# Patient Record
Sex: Male | Born: 1960
Health system: Southern US, Community
[De-identification: ages and names within clinical notes are randomized; demographics above are authoritative.]

## PROBLEM LIST (undated history)

## (undated) DIAGNOSIS — I209 Angina pectoris, unspecified: Secondary | ICD-10-CM

## (undated) DIAGNOSIS — Z789 Other specified health status: Secondary | ICD-10-CM

## (undated) DIAGNOSIS — R011 Cardiac murmur, unspecified: Secondary | ICD-10-CM

## (undated) DIAGNOSIS — E785 Hyperlipidemia, unspecified: Secondary | ICD-10-CM

## (undated) DIAGNOSIS — T753XXA Motion sickness, initial encounter: Secondary | ICD-10-CM

## (undated) DIAGNOSIS — F419 Anxiety disorder, unspecified: Secondary | ICD-10-CM

## (undated) DIAGNOSIS — C6290 Malignant neoplasm of unspecified testis, unspecified whether descended or undescended: Secondary | ICD-10-CM

## (undated) DIAGNOSIS — N200 Calculus of kidney: Secondary | ICD-10-CM

## (undated) DIAGNOSIS — R51 Headache: Secondary | ICD-10-CM

## (undated) DIAGNOSIS — I10 Essential (primary) hypertension: Secondary | ICD-10-CM

## (undated) DIAGNOSIS — G4730 Sleep apnea, unspecified: Secondary | ICD-10-CM

## (undated) DIAGNOSIS — I2510 Atherosclerotic heart disease of native coronary artery without angina pectoris: Secondary | ICD-10-CM

## (undated) HISTORY — PX: EYE SURGERY: SHX253

## (undated) HISTORY — PX: OTHER SURGICAL HISTORY: SHX169

## (undated) HISTORY — PX: LASIK: SHX215

## (undated) HISTORY — PX: SHOULDER SURGERY: SHX246

## (undated) HISTORY — PX: HERNIA REPAIR: SHX51

## (undated) HISTORY — DX: Sleep apnea, unspecified: G47.30

## (undated) HISTORY — DX: Malignant neoplasm of unspecified testis, unspecified whether descended or undescended: C62.90

## (undated) HISTORY — DX: Anxiety disorder, unspecified: F41.9

---

## 1998-11-29 HISTORY — PX: CARDIAC CATHETERIZATION: SHX172

## 1999-10-01 ENCOUNTER — Inpatient Hospital Stay (HOSPITAL_COMMUNITY): Admission: AD | Admit: 1999-10-01 | Discharge: 1999-10-01 | Payer: Self-pay | Admitting: Cardiology

## 2001-01-13 ENCOUNTER — Encounter: Admission: RE | Admit: 2001-01-13 | Discharge: 2001-01-13 | Payer: Self-pay | Admitting: Family Medicine

## 2001-01-13 ENCOUNTER — Encounter: Payer: Self-pay | Admitting: Family Medicine

## 2003-12-25 ENCOUNTER — Ambulatory Visit (HOSPITAL_COMMUNITY): Admission: RE | Admit: 2003-12-25 | Discharge: 2003-12-25 | Payer: Self-pay | Admitting: Orthopedic Surgery

## 2005-05-04 ENCOUNTER — Encounter: Payer: Self-pay | Admitting: Emergency Medicine

## 2005-05-04 ENCOUNTER — Ambulatory Visit: Payer: Self-pay | Admitting: Cardiology

## 2005-05-04 ENCOUNTER — Observation Stay (HOSPITAL_COMMUNITY): Admission: AD | Admit: 2005-05-04 | Discharge: 2005-05-06 | Payer: Self-pay | Admitting: Cardiology

## 2005-10-22 ENCOUNTER — Encounter: Admission: RE | Admit: 2005-10-22 | Discharge: 2005-10-22 | Payer: Self-pay | Admitting: Family Medicine

## 2006-07-14 ENCOUNTER — Encounter: Admission: RE | Admit: 2006-07-14 | Discharge: 2006-07-14 | Payer: Self-pay | Admitting: Family Medicine

## 2006-12-14 ENCOUNTER — Emergency Department (HOSPITAL_COMMUNITY): Admission: EM | Admit: 2006-12-14 | Discharge: 2006-12-14 | Payer: Self-pay | Admitting: Family Medicine

## 2010-12-11 ENCOUNTER — Emergency Department (HOSPITAL_COMMUNITY)
Admission: EM | Admit: 2010-12-11 | Discharge: 2010-12-11 | Payer: Self-pay | Source: Home / Self Care | Admitting: Family Medicine

## 2011-02-10 ENCOUNTER — Inpatient Hospital Stay (INDEPENDENT_AMBULATORY_CARE_PROVIDER_SITE_OTHER)
Admission: RE | Admit: 2011-02-10 | Discharge: 2011-02-10 | Disposition: A | Discharge status: Home / Self Care | Payer: BC Managed Care – PPO | Source: Ambulatory Visit | Attending: Family Medicine | Admitting: Family Medicine

## 2011-02-10 ENCOUNTER — Ambulatory Visit (INDEPENDENT_AMBULATORY_CARE_PROVIDER_SITE_OTHER): Payer: BC Managed Care – PPO

## 2011-02-10 DIAGNOSIS — R05 Cough: Secondary | ICD-10-CM

## 2011-02-10 LAB — POCT I-STAT, CHEM 8
BUN: 10 mg/dL (ref 6–23)
Creatinine, Ser: 1 mg/dL (ref 0.4–1.5)
Hemoglobin: 14.3 g/dL (ref 13.0–17.0)
Potassium: 4 mEq/L (ref 3.5–5.1)
Sodium: 138 mEq/L (ref 135–145)

## 2011-02-10 LAB — DIFFERENTIAL
Basophils Absolute: 0 10*3/uL (ref 0.0–0.1)
Lymphocytes Relative: 30 % (ref 12–46)
Monocytes Absolute: 0.4 10*3/uL (ref 0.1–1.0)
Monocytes Relative: 6 % (ref 3–12)
Neutro Abs: 4 10*3/uL (ref 1.7–7.7)
Neutrophils Relative %: 61 % (ref 43–77)

## 2011-02-10 LAB — CBC
HCT: 40.2 % (ref 39.0–52.0)
Hemoglobin: 14.3 g/dL (ref 13.0–17.0)
MCHC: 35.6 g/dL (ref 30.0–36.0)
RBC: 4.58 MIL/uL (ref 4.22–5.81)

## 2011-04-16 NOTE — Cardiovascular Report (Signed)
Nathan Calderon, FICK NO.:  [id number]   MEDICAL RECORD NO.:  [id number]          PATIENT TYPE:  INP   LOCATION:  3706                         FACILITY:  [facility]   PHYSICIAN:  Jonelle Sidle, M.D. LHCDATE OF BIRTH:  January 25, 1961   DATE OF PROCEDURE:  DATE OF DISCHARGE:                              CARDIAC CATHETERIZATION   PRIMARY CARE PHYSICIAN:  Dr.  Nathanial Rancher in [employer].   INDICATIONS:  [name] is a 50 year old male with a history of type 2  diabetes mellitus reportedly with difficult control and a hemoglobin A1c of  10.4%, dyslipidemia, and recent progressive problems with chest pain.  He  has also noted some potential dysphasia.  Electrocardiogram shows  nonspecific changes with a Q-wave and inverted T-wave in lead III.  He has  ruled out for myocardial infarction, although has had some intermittent  chest pain during his hospital observation.  He is referred now for  definitive coronary angiography to reassess the coronary anatomy.   PROCEDURES PERFORMED:  1.  Left heart catheterization.  2.  Selective coronary angiography.  3.  Left ventriculography.   ACCESS AND EQUIPMENT:  The area about the right femoral artery was  anesthetized with 1% lidocaine and a 6-French sheath was placed in the right  femoral artery via the modified Seldinger technique.  Standard preformed 6-  [place] and JR4 catheters were used for selective coronary angiography  and an angled pigtail catheter was used for left heart catheterization and  left ventriculography.  All exchanges were made over a wire and the patient  tolerated the procedure well without immediate complication.   HEMODYNAMIC RESULTS:  Left ventricle 133/17 mmHg, post angiography.  Aorta  132/72 mmHg.   ANGIOGRAPHIC FINDINGS:  1.  The left main coronary artery is free of significant flow-limiting      coronary atherosclerosis.  2.  The left anterior descending is a medium- to large-caliber vessel that  bifurcates at the apex.  There is a large proximal bifurcating diagonal      branch as well as smaller subbranch proximal to this.  Minimal luminal      irregularities are noted.  3.  The circumflex coronary artery is a medium- to large-caliber vessel with      3 obtuse marginal branches.  There is approximately 20% stenosis in the      most proximal obtuse marginal branch and other minor luminal      irregularities.  No flow-limiting stenoses are noted.  4.  The right coronary artery is a large vessel with a large proximal RV      marginal branch.  This vessel and dominant.  Only minor luminal      irregularities are noted.   LEFT VENTRICULOGRAPHY:  Left ventriculography was performed in the RAO  projection revealing an ejection fraction of 55% to 60% with no significant  mitral regurgitation.   DIAGNOSES:  1.  Minor coronary atherosclerosis as described with no flow-limiting      lesions.  2.  Left ventricular ejection fraction of 55% to 60% with left ventricle end-  diastolic pressure of 17 mmHg, post angiography, and no significant      mitral regurgitation.   RECOMMENDATIONS:  I reviewed the results with the patient and with Dr.  Nathanial Rancher by phone.  Plan at this point will be to begin a proton pump  inhibitor to treat for potential gastrointestinal etiology such as  gastroesophageal reflux disease and/or esophagitis.  The patient will  otherwise continue diabetes management and lipid control (recent LDL  cholesterol was 77) and also begin taking a coated aspirin daily.  He will  plan to follow up Dr. Nathanial Rancher this month.        SGM/MEDQ  D:  05/06/2005  T:  05/06/2005  Job:  [id number]   cc:   Dr. Nathanial Rancher, [place], [place]

## 2011-04-16 NOTE — Discharge Summary (Signed)
NAMEASSAD, HARBESON                 ACCOUNT NO.:  [id number]   MEDICAL RECORD NO.:  [id number]          PATIENT TYPE:  INP   LOCATION:  3706                         FACILITY:  [facility]   PHYSICIAN:  Jonelle Sidle, M.D. LHCDATE OF BIRTH:  1961/08/16   DATE OF ADMISSION:  05/04/2005  DATE OF DISCHARGE:  05/06/2005                                 DISCHARGE SUMMARY   PROCEDURES:  1.  Cardiac catheterization.  2.  Coronary arteriogram.  3.  Left ventriculogram.   DISCHARGE DIAGNOSES:  1.  Chest pain, status post cardiac catheterization this admission with 20%      lesion in the OM, as the only lesion, and an EF of 55-65% with no wall      motion abnormalities.  2.  Insulin dependent diabetes, hemoglobin A1c 9.7 this admission.  3.  Hyperlipidemia, Lipitor 10 mg daily with a total cholesterol 136,      triglycerides 129, HDL 33, LDL 77, this admission.  4.  Family history of coronary artery disease in his father who died at age      80 of an myocardial infarction; and his mother died who died at age 50      of diabetes and possibly also with heart disease.  5.  Status post hernia repair, shoulder surgery, LASIK eye surgery, and      cardiac catheterization.  6.  Dysphagia, occasionally feeling like he has trouble swallowing.   HOSPITAL COURSE:  Mr. Keidel is a 50 year old male with a history of chest pain  and cardiac catheterization in 2000. He had chest pain that was intermittent  and started 5 days prior to admission. He stated there was no change with  activity and was getting multiple episodes every day. He complained of  general malaise and fatigue and stated that these symptoms have been worse  than usual. He stated that CBGs have been between 150 and 450 recently. He  came to the emergency room and was admitted for further evaluation and  treatment.   His cardiac enzymes were negative for MI. He was placed on IV nitroglycerin  and remained pain free but had a headache and so  this was discontinued. He  had no recurrence of pain. It was felt that cardiac catheterization was  indicated to further evaluate his anatomy; and this was performed on  05/06/2005.   The cardiac catheterization showed a 20% lesion in the OM. There was no  other disease and he had no MR, no wall motion abnormalities. His EF was 55-  60%. Dr. Diona Browner evaluated Mr. Thier and felt that he needed a proton pump  inhibitor for potential GI etiology. He is to continue the aspirin, Plavix  and Lipitor as well as he is encouraged to follow a diabetic diet and work  with Dr. Lemar Livings on diabetes management.   Postcath Mr. Vangorder was without chest pain or shortness of breath. His groin  was without ecchymosis or hematoma. He was considered stable for discharge;  and has outpatient followup arranged with Dr. Lemar Livings and is to follow up  with Dr. Diona Browner  on a p.r.n. basis.   DISCHARGE INSTRUCTIONS:  1.  His activity level is to include no driving for 2 days and no strenuous      activity for 5 days.  2.  He is to stick to a low-fat diabetic diet.  3.  He is to call our office for problems with the cath site.  4.  He has a followup appoint with Dr. Lemar Livings on July 10 at 3:45 and he is      to followup with Dr. Diona Browner as needed.   DISCHARGE MEDICATIONS:  1.  Coated aspirin 81 mg daily.  2.  Lantus 60 units every evening.  3.  Lipitor 10 mg daily.  4.  Nexium 40 mg daily.      Hermenia Bers   RB/MEDQ  D:  05/06/2005  T:  05/06/2005  Job:  [id number]   cc:   Dr. Candelaria Stagers, M.D. [facility]

## 2011-04-16 NOTE — H&P (Signed)
NAMEMARSEAN, ELKHATIB NO.:  [id number]   MEDICAL RECORD NO.:  [id number]          PATIENT TYPE:  INP   LOCATION:  0103                         FACILITY:  [facility]   PHYSICIAN:  Jonelle Sidle, M.D. LHCDATE OF BIRTH:  [birth year]   DATE OF ADMISSION:  05/04/2005  DATE OF DISCHARGE:                                HISTORY & PHYSICAL   PRIMARY CARE PHYSICIAN:  Dr. Nathanial Rancher in [place].   REASON FOR ADMISSION:  Chest discomfort.   HISTORY OF PRESENT ILLNESS:  Mr. Carriger is a pleasant 50 year old male with a  history of type 2 diabetes mellitus, reportedly with difficult control  recently, and an hemoglobin A1C of 10.4% and dyslipidemia, who presented to  [facility] emergency department complaining of rapid onset of  chest pain.  He describes an approximately one-week history of intermittent,  dull chest pain that is mild and at times in different parts of his chest,  sometimes radiating up to his neck.  He notes no specific trigger, although  he does state that he has had a longer-standing problem with dysphagia and  seems to think this may be related.  He is not certain about symptoms  proceeding a previous heart catheterization, which was performed back in  November, 2000 by Dr. Eden Emms.  This study showed essentially normal coronary  arteries with an ejection fraction of 81% and at that point, no other  specific cardiac testing was undertaken.  Mr. Eskew denies having any recent  exertional chest pain.  His initial point-of-care troponin I levels are  normal, and his electrocardiogram is nonspecific, showing sinus rhythm at 76  beats per minute.  There is a Q wave with inverted T wave in lead III but no  diagnostic Q's in II or AVS.  He is painfree at this particular point.  He  denies having any interval cardiac testing or cardiology followup since his  catheterization in 2000.   ALLERGIES:  No known drug allergies.   CURRENT MEDICATIONS:  1.  Lantus 60  units daily.  2.  Lipitor 10 mg p.o. daily.   PAST MEDICAL HISTORY:  1.  Essentially normal coronary arteries with an ejection fraction of 81%,      documented at catheterization in November, 2000 by Dr. Eden Emms.  2.  Type 2 diabetes mellitus, reportedly with difficult control recently and      an hemoglobin A1C of 10.4%.  3.  Dyslipidemia.  Reportedly well controlled on statin therapy.  4.  Status post hernia repair, left shoulder surgery, LASIX surgery on both      eyes.   SOCIAL HISTORY:  Patient is married and lives in [place].  He works as a  Teaching laboratory technician, which he states is not particularly strenuous.  He  has no significant tobacco or alcohol use history.   FAMILY HISTORY:  Significant for premature cardiovascular disease.  The  patient states that his mother died at age 68 with type 2 diabetes mellitus  and an enlarged heart.  He also stated that his father died at age 50 with  a myocardial  infarction.   REVIEW OF SYSTEMS:  As described in the present illness.  He has had no  recent weight change, although he reports a 50 pound weight loss  approximately seven years ago.  He has intermittent headaches but no  dizziness or syncope.  He has had some slight shortness of breath activity  but without significant change recently.  He does report problems with  prior cartilage damage after using a post-hole digger.  He does have  some intermittent apparent chest wall pain due to this.  He has also had  trouble with dysphagia recently.  He has had some reflux symptoms as well.  No lower extremity edema or claudication symptoms.   PHYSICAL EXAMINATION:  VITAL SIGNS:  Temperature 97.7 degrees, heart rate 79  and regular, respirations 20, blood pressure 129/77.  Oxygen saturation is  99% on room air.  GENERAL:  This is a well-developed male lying in bed in no acute distress.  HEENT:  Conjunctivae and lids are normal.  Pharynx is clear.  NECK:  Supple without elevated jugular  venous pressure or carotid bruits.  No thyromegaly is noted.  LUNGS:  Clear to auscultation with bilateral breathing at rest.  CARDIOVASCULAR:  Regular rate and rhythm without loud murmur or S3 gallop.  There is no pericardial rub.  There is no chest wall tenderness to  palpation.  ABDOMEN:  Soft and nontender with no hepatomegaly.  No bruits are noted.  There is no guarding.  EXTREMITIES:  No pitting edema with 2+ pulses.  SKIN:  No ulcer changes are noted.  MUSCULOSKELETAL:  No kyphosis noted.  NEUROLOGIC:  Patient is alert and oriented x 3.  Affect is normal.   Initial laboratory data shows normal point-of-care troponin I levels.  WBC  6.2, hemoglobin 14.3, hematocrit 39.5, platelets 214.  The remaining labs  are pending, as is chest x-ray.   IMPRESSION:  1.  Chest pain syndrome, as outlined, in a 50 year old male with somewhat      atypical features, although in the setting of type 2 diabetes mellitus,      a family history of premature cardiovascular disease.  He underwent      cardiac catheterization approximately six years ago, showing reassuring      coronary anatomy and normal ejection fraction at that time but has had      no interval testing.  The symptoms have been of recent onset, although      he has also been plagued by problems with dysphagia and apparent chest      wall pain due to prior cartilage damage.  His initial point-of-care      cardiac markers are negative, and his electrocardiogram is nonspecific      with a single Q wave and T wave inversion in lead III.  He is painfree      at present.  2.  Type 2 diabetes mellitus.  3.  Hyperdyslipidemia:  On statin therapy.  4.  Family history of premature cardiovascular disease.   PLAN:  1.  Patient will be admitted to telemetry at [facility]  We will      continue to cycle cardiac markers on heparin and aspirin in addition to     the patient's home medications and sliding-scale insulin.  2.  He will be  kept n.p.o. after midnight.  Will make firm plans regarding      additional cardiac testing in the morning when all laboratory data has  returned and based on further clinical reassessment.  Further plans to      follow.     SGM/MEDQ  D:  05/04/2005  T:  05/04/2005  Job:  [id number]   cc:   Dr. Susie Cassette, [place]

## 2012-03-16 ENCOUNTER — Encounter (INDEPENDENT_AMBULATORY_CARE_PROVIDER_SITE_OTHER): Payer: BC Managed Care – PPO | Admitting: Ophthalmology

## 2012-03-16 DIAGNOSIS — H3581 Retinal edema: Secondary | ICD-10-CM

## 2012-03-16 DIAGNOSIS — H2510 Age-related nuclear cataract, unspecified eye: Secondary | ICD-10-CM

## 2012-03-16 DIAGNOSIS — E11359 Type 2 diabetes mellitus with proliferative diabetic retinopathy without macular edema: Secondary | ICD-10-CM

## 2012-03-16 DIAGNOSIS — H43819 Vitreous degeneration, unspecified eye: Secondary | ICD-10-CM

## 2012-03-16 DIAGNOSIS — E1139 Type 2 diabetes mellitus with other diabetic ophthalmic complication: Secondary | ICD-10-CM

## 2012-03-16 DIAGNOSIS — H4310 Vitreous hemorrhage, unspecified eye: Secondary | ICD-10-CM

## 2012-03-21 ENCOUNTER — Encounter (HOSPITAL_COMMUNITY): Payer: Self-pay | Admitting: Pharmacy Technician

## 2012-03-21 ENCOUNTER — Other Ambulatory Visit (INDEPENDENT_AMBULATORY_CARE_PROVIDER_SITE_OTHER): Payer: BC Managed Care – PPO | Admitting: Ophthalmology

## 2012-03-21 DIAGNOSIS — E11359 Type 2 diabetes mellitus with proliferative diabetic retinopathy without macular edema: Secondary | ICD-10-CM

## 2012-03-22 ENCOUNTER — Other Ambulatory Visit (INDEPENDENT_AMBULATORY_CARE_PROVIDER_SITE_OTHER): Payer: BC Managed Care – PPO | Admitting: Ophthalmology

## 2012-03-22 DIAGNOSIS — H3581 Retinal edema: Secondary | ICD-10-CM

## 2012-04-03 ENCOUNTER — Encounter (HOSPITAL_COMMUNITY): Payer: Self-pay

## 2012-04-03 NOTE — Progress Notes (Signed)
Contacted Dr. Ashley Royalty office, spoke with Darel Hong requested preop orders for surgery on 04/04/12.

## 2012-04-04 ENCOUNTER — Ambulatory Visit (HOSPITAL_COMMUNITY): Payer: BC Managed Care – PPO | Admitting: Anesthesiology

## 2012-04-04 ENCOUNTER — Encounter (HOSPITAL_COMMUNITY): Payer: Self-pay | Admitting: Certified Registered Nurse Anesthetist

## 2012-04-04 ENCOUNTER — Ambulatory Visit (HOSPITAL_COMMUNITY)
Admission: RE | Admit: 2012-04-04 | Discharge: 2012-04-04 | Disposition: A | Discharge status: Home / Self Care | Payer: BC Managed Care – PPO | Source: Ambulatory Visit | Attending: Ophthalmology | Admitting: Ophthalmology

## 2012-04-04 ENCOUNTER — Encounter (HOSPITAL_COMMUNITY): Payer: Self-pay | Admitting: Anesthesiology

## 2012-04-04 ENCOUNTER — Encounter (HOSPITAL_COMMUNITY)
Admission: RE | Disposition: A | Discharge status: Home / Self Care | Payer: Self-pay | Source: Ambulatory Visit | Attending: Ophthalmology

## 2012-04-04 ENCOUNTER — Ambulatory Visit (HOSPITAL_COMMUNITY): Payer: BC Managed Care – PPO

## 2012-04-04 ENCOUNTER — Encounter (HOSPITAL_COMMUNITY): Payer: Self-pay | Admitting: *Deleted

## 2012-04-04 ENCOUNTER — Ambulatory Visit (HOSPITAL_COMMUNITY): Admission: RE | Admit: 2012-04-04 | Payer: BC Managed Care – PPO | Source: Ambulatory Visit | Admitting: Ophthalmology

## 2012-04-04 DIAGNOSIS — H4310 Vitreous hemorrhage, unspecified eye: Secondary | ICD-10-CM

## 2012-04-04 DIAGNOSIS — E1139 Type 2 diabetes mellitus with other diabetic ophthalmic complication: Secondary | ICD-10-CM

## 2012-04-04 DIAGNOSIS — I10 Essential (primary) hypertension: Secondary | ICD-10-CM | POA: Insufficient documentation

## 2012-04-04 DIAGNOSIS — E1165 Type 2 diabetes mellitus with hyperglycemia: Secondary | ICD-10-CM

## 2012-04-04 DIAGNOSIS — E785 Hyperlipidemia, unspecified: Secondary | ICD-10-CM | POA: Insufficient documentation

## 2012-04-04 DIAGNOSIS — E113599 Type 2 diabetes mellitus with proliferative diabetic retinopathy without macular edema, unspecified eye: Secondary | ICD-10-CM

## 2012-04-04 DIAGNOSIS — H3340 Traction detachment of retina, unspecified eye: Secondary | ICD-10-CM

## 2012-04-04 DIAGNOSIS — E11359 Type 2 diabetes mellitus with proliferative diabetic retinopathy without macular edema: Secondary | ICD-10-CM

## 2012-04-04 DIAGNOSIS — R51 Headache: Secondary | ICD-10-CM | POA: Insufficient documentation

## 2012-04-04 HISTORY — PX: PARS PLANA VITRECTOMY: SHX2166

## 2012-04-04 HISTORY — DX: Hyperlipidemia, unspecified: E78.5

## 2012-04-04 HISTORY — DX: Angina pectoris, unspecified: I20.9

## 2012-04-04 HISTORY — DX: Calculus of kidney: N20.0

## 2012-04-04 HISTORY — DX: Essential (primary) hypertension: I10

## 2012-04-04 HISTORY — DX: Headache: R51

## 2012-04-04 LAB — CBC
HCT: 39.5 % (ref 39.0–52.0)
Hemoglobin: 14.5 g/dL (ref 13.0–17.0)
RBC: 4.63 MIL/uL (ref 4.22–5.81)
WBC: 7.3 10*3/uL (ref 4.0–10.5)

## 2012-04-04 LAB — BASIC METABOLIC PANEL
BUN: 13 mg/dL (ref 6–23)
CO2: 29 mEq/L (ref 19–32)
Chloride: 104 mEq/L (ref 96–112)
Glucose, Bld: 234 mg/dL — ABNORMAL HIGH (ref 70–99)
Potassium: 4.6 mEq/L (ref 3.5–5.1)

## 2012-04-04 LAB — SURGICAL PCR SCREEN: Staphylococcus aureus: NEGATIVE

## 2012-04-04 LAB — GLUCOSE, CAPILLARY: Glucose-Capillary: 215 mg/dL — ABNORMAL HIGH (ref 70–99)

## 2012-04-04 SURGERY — PARS PLANA VITRECTOMY WITH 25 GAUGE
Anesthesia: General | Site: Eye | Laterality: Left | Wound class: Clean

## 2012-04-04 MED ORDER — CEFAZOLIN SODIUM-DEXTROSE 2-3 GM-% IV SOLR
2.0000 g | INTRAVENOUS | Status: AC
  Administered 2012-04-04: 2 g via INTRAVENOUS

## 2012-04-04 MED ORDER — LACTATED RINGERS IV SOLN: INTRAVENOUS | Status: DC

## 2012-04-04 MED ORDER — EPINEPHRINE HCL 1 MG/ML IJ SOLN
INTRAOCULAR | Status: DC | PRN
  Administered 2012-04-04: 13:00:00

## 2012-04-04 MED ORDER — ONDANSETRON HCL 4 MG/2ML IJ SOLN
INTRAMUSCULAR | Status: DC | PRN
  Administered 2012-04-04: 4 mg via INTRAVENOUS

## 2012-04-04 MED ORDER — LACTATED RINGERS IV SOLN: INTRAVENOUS | Status: DC | PRN

## 2012-04-04 MED ORDER — GATIFLOXACIN 0.5 % OP SOLN
1.0000 [drp] | OPHTHALMIC | Status: AC | PRN
  Administered 2012-04-04 (×3): 1 [drp] via OPHTHALMIC
  Filled 2012-04-04: qty 2.5

## 2012-04-04 MED ORDER — ATROPINE SULFATE 1 % OP SOLN
OPHTHALMIC | Status: DC | PRN
  Administered 2012-04-04: 2 [drp] via OPHTHALMIC

## 2012-04-04 MED ORDER — PROPOFOL 10 MG/ML IV EMUL
INTRAVENOUS | Status: DC | PRN
  Administered 2012-04-04: 200 mg via INTRAVENOUS

## 2012-04-04 MED ORDER — CEFAZOLIN SODIUM 1-5 GM-% IV SOLN
1.0000 g | INTRAVENOUS | Status: DC
  Filled 2012-04-04: qty 50

## 2012-04-04 MED ORDER — CYCLOPENTOLATE HCL 1 % OP SOLN
1.0000 [drp] | OPHTHALMIC | Status: AC | PRN
  Administered 2012-04-04 (×3): 1 [drp] via OPHTHALMIC
  Filled 2012-04-04: qty 2

## 2012-04-04 MED ORDER — BACITRACIN-POLYMYXIN B 500-10000 UNIT/GM OP OINT
TOPICAL_OINTMENT | OPHTHALMIC | Status: DC | PRN
  Administered 2012-04-04: 1 via OPHTHALMIC

## 2012-04-04 MED ORDER — ONDANSETRON HCL 4 MG/2ML IJ SOLN: 4.0000 mg | Freq: Four times a day (QID) | INTRAMUSCULAR | Status: DC | PRN

## 2012-04-04 MED ORDER — GATIFLOXACIN 0.5 % OP SOLN
1.0000 [drp] | Freq: Four times a day (QID) | OPHTHALMIC | Status: DC
  Filled 2012-04-04: qty 2.5

## 2012-04-04 MED ORDER — PREDNISOLONE ACETATE 1 % OP SUSP
1.0000 [drp] | Freq: Four times a day (QID) | OPHTHALMIC | Status: DC
  Filled 2012-04-04: qty 1

## 2012-04-04 MED ORDER — GLYCOPYRROLATE 0.2 MG/ML IJ SOLN
INTRAMUSCULAR | Status: DC | PRN
  Administered 2012-04-04: 0.6 mg via INTRAVENOUS

## 2012-04-04 MED ORDER — CEFAZOLIN SODIUM-DEXTROSE 2-3 GM-% IV SOLR
INTRAVENOUS | Status: AC
  Filled 2012-04-04: qty 50

## 2012-04-04 MED ORDER — OXYCODONE-ACETAMINOPHEN 5-325 MG PO TABS
1.0000 | ORAL_TABLET | ORAL | Status: DC | PRN
  Administered 2012-04-04: 2 via ORAL

## 2012-04-04 MED ORDER — PROVISC 10 MG/ML IO SOLN
INTRAOCULAR | Status: DC | PRN
  Administered 2012-04-04: .85 mL via INTRAOCULAR

## 2012-04-04 MED ORDER — SODIUM CHLORIDE 0.9 % IV SOLN
INTRAVENOUS | Status: DC | PRN
  Administered 2012-04-04: 13:00:00 via INTRAVENOUS

## 2012-04-04 MED ORDER — MUPIROCIN 2 % EX OINT
TOPICAL_OINTMENT | CUTANEOUS | Status: AC
  Administered 2012-04-04: 1 via NASAL
  Filled 2012-04-04: qty 22

## 2012-04-04 MED ORDER — ACETAZOLAMIDE SODIUM 500 MG IJ SOLR
500.0000 mg | Freq: Once | INTRAMUSCULAR | Status: DC
  Filled 2012-04-04: qty 500

## 2012-04-04 MED ORDER — SODIUM CHLORIDE 0.9 % IJ SOLN
INTRAMUSCULAR | Status: DC | PRN
  Administered 2012-04-04: 13:00:00

## 2012-04-04 MED ORDER — LATANOPROST 0.005 % OP SOLN
1.0000 [drp] | Freq: Every day | OPHTHALMIC | Status: DC
  Filled 2012-04-04: qty 2.5

## 2012-04-04 MED ORDER — BACITRACIN-POLYMYXIN B 500-10000 UNIT/GM OP OINT
1.0000 "application " | TOPICAL_OINTMENT | Freq: Four times a day (QID) | OPHTHALMIC | Status: DC
  Filled 2012-04-04: qty 3.5

## 2012-04-04 MED ORDER — PHENYLEPHRINE HCL 2.5 % OP SOLN
1.0000 [drp] | OPHTHALMIC | Status: AC | PRN
  Administered 2012-04-04 (×3): 1 [drp] via OPHTHALMIC
  Filled 2012-04-04: qty 3

## 2012-04-04 MED ORDER — FENTANYL CITRATE 0.05 MG/ML IJ SOLN
INTRAMUSCULAR | Status: DC | PRN
  Administered 2012-04-04: 25 ug via INTRAVENOUS
  Administered 2012-04-04: 50 ug via INTRAVENOUS

## 2012-04-04 MED ORDER — TROPICAMIDE 1 % OP SOLN
1.0000 [drp] | OPHTHALMIC | Status: AC | PRN
  Administered 2012-04-04 (×3): 1 [drp] via OPHTHALMIC
  Filled 2012-04-04: qty 3

## 2012-04-04 MED ORDER — BUPIVACAINE HCL 0.75 % IJ SOLN
INTRAMUSCULAR | Status: DC | PRN
  Administered 2012-04-04: 10 mL

## 2012-04-04 MED ORDER — MIDAZOLAM HCL 5 MG/5ML IJ SOLN
INTRAMUSCULAR | Status: DC | PRN
  Administered 2012-04-04: 1 mg via INTRAVENOUS

## 2012-04-04 MED ORDER — 0.9 % SODIUM CHLORIDE (POUR BTL) OPTIME
TOPICAL | Status: DC | PRN
  Administered 2012-04-04: 1000 mL

## 2012-04-04 MED ORDER — MUPIROCIN 2 % EX OINT
TOPICAL_OINTMENT | Freq: Once | CUTANEOUS | Status: AC
  Administered 2012-04-04: 1 via NASAL
  Filled 2012-04-04: qty 22

## 2012-04-04 MED ORDER — NEOSTIGMINE METHYLSULFATE 1 MG/ML IJ SOLN
INTRAMUSCULAR | Status: DC | PRN
  Administered 2012-04-04: 4 mg via INTRAVENOUS

## 2012-04-04 MED ORDER — BSS PLUS IO SOLN
INTRAOCULAR | Status: DC | PRN
  Administered 2012-04-04: 1

## 2012-04-04 MED ORDER — FENTANYL CITRATE 0.05 MG/ML IJ SOLN: 25.0000 ug | INTRAMUSCULAR | Status: DC | PRN

## 2012-04-04 MED ORDER — DEXAMETHASONE SODIUM PHOSPHATE 10 MG/ML IJ SOLN
INTRAMUSCULAR | Status: DC | PRN
  Administered 2012-04-04: 10 mg

## 2012-04-04 MED ORDER — BRIMONIDINE TARTRATE 0.2 % OP SOLN
1.0000 [drp] | Freq: Two times a day (BID) | OPHTHALMIC | Status: DC
  Filled 2012-04-04: qty 5

## 2012-04-04 MED ORDER — OXYCODONE-ACETAMINOPHEN 10-325 MG PO TABS: 1.0000 | ORAL_TABLET | ORAL | Status: AC | PRN

## 2012-04-04 MED ORDER — ROCURONIUM BROMIDE 100 MG/10ML IV SOLN
INTRAVENOUS | Status: DC | PRN
  Administered 2012-04-04: 50 mg via INTRAVENOUS

## 2012-04-04 MED ORDER — SODIUM CHLORIDE 0.9 % IV SOLN
INTRAVENOUS | Status: DC
  Administered 2012-04-04: 12:00:00 via INTRAVENOUS

## 2012-04-04 SURGICAL SUPPLY — 71 items
APL SRG 3 HI ABS STRL LF PLS (MISCELLANEOUS)
APPLICATOR DR MATTHEWS STRL (MISCELLANEOUS) IMPLANT
BALL CTTN LRG ABS STRL LF (GAUZE/BANDAGES/DRESSINGS) ×3
BLADE EYE CATARACT 19 1.4 BEAV (BLADE) IMPLANT
BLADE MVR KNIFE 19G (BLADE) IMPLANT
BLADE MVR KNIFE 20G (BLADE) IMPLANT
CANNULA DUAL BORE 23G (CANNULA) IMPLANT
CANNULA FLEX TIP 25G (CANNULA) IMPLANT
CLOTH BEACON ORANGE TIMEOUT ST (SAFETY) ×2 IMPLANT
CORDS BIPOLAR (ELECTRODE) ×1 IMPLANT
COTTONBALL LRG STERILE PKG (GAUZE/BANDAGES/DRESSINGS) ×6 IMPLANT
DRAPE INCISE 51X51 W/FILM STRL (DRAPES) ×2 IMPLANT
DRAPE OPHTHALMIC 77X100 STRL (CUSTOM PROCEDURE TRAY) ×2 IMPLANT
FILTER BLUE MILLIPORE (MISCELLANEOUS) IMPLANT
FILTER STRAW FLUID ASPIR (MISCELLANEOUS) IMPLANT
FORCEPS ECKARDT ILM 25G SERR (OPHTHALMIC RELATED) IMPLANT
GLOVE SS BIOGEL STRL SZ 6.5 (GLOVE) ×1 IMPLANT
GLOVE SS BIOGEL STRL SZ 7 (GLOVE) ×1 IMPLANT
GLOVE SUPERSENSE BIOGEL SZ 6.5 (GLOVE) ×1
GLOVE SUPERSENSE BIOGEL SZ 7 (GLOVE) ×1
GLOVE SURG 8.5 LATEX PF (GLOVE) ×2 IMPLANT
GOWN STRL NON-REIN LRG LVL3 (GOWN DISPOSABLE) ×6 IMPLANT
ILLUMINATOR CHOW PICK 25GA (MISCELLANEOUS) ×2 IMPLANT
KIT BASIN OR (CUSTOM PROCEDURE TRAY) ×2 IMPLANT
KIT ROOM TURNOVER OR (KITS) ×2 IMPLANT
KNIFE CRESCENT 2.5 55 ANG (BLADE) IMPLANT
LENS BIOM SUPER VIEW SET DISP (OPHTHALMIC RELATED) ×1 IMPLANT
MARKER SKIN DUAL TIP RULER LAB (MISCELLANEOUS) IMPLANT
MASK EYE SHIELD (GAUZE/BANDAGES/DRESSINGS) ×1 IMPLANT
MICROPICK 25G (MISCELLANEOUS)
NDL 18GX1X1/2 (RX/OR ONLY) (NEEDLE) ×1 IMPLANT
NDL 25GX 5/8IN NON SAFETY (NEEDLE) ×1 IMPLANT
NDL FILTER BLUNT 18X1 1/2 (NEEDLE) ×1 IMPLANT
NDL HYPO 30X.5 LL (NEEDLE) ×1 IMPLANT
NEEDLE 18GX1X1/2 (RX/OR ONLY) (NEEDLE) ×2 IMPLANT
NEEDLE 25GX 5/8IN NON SAFETY (NEEDLE) ×2 IMPLANT
NEEDLE 27GAX1X1/2 (NEEDLE) IMPLANT
NEEDLE FILTER BLUNT 18X 1/2SAF (NEEDLE) ×1
NEEDLE FILTER BLUNT 18X1 1/2 (NEEDLE) ×1 IMPLANT
NEEDLE HYPO 30X.5 LL (NEEDLE) ×2 IMPLANT
NS IRRIG 1000ML POUR BTL (IV SOLUTION) ×2 IMPLANT
PACK VITRECTOMY CUSTOM (CUSTOM PROCEDURE TRAY) ×2 IMPLANT
PAD ARMBOARD 7.5X6 YLW CONV (MISCELLANEOUS) ×4 IMPLANT
PAD EYE OVAL STERILE LF (GAUZE/BANDAGES/DRESSINGS) ×1 IMPLANT
PAK VITRECTOMY PIK 25 GA (OPHTHALMIC RELATED) ×2 IMPLANT
PENCIL BIPOLAR 25GA STR DISP (OPHTHALMIC RELATED) ×1 IMPLANT
PICK MICROPICK 25G (MISCELLANEOUS) IMPLANT
PROBE DIRECTIONAL LASER (MISCELLANEOUS) IMPLANT
REPL STRA BRUSH NDL (NEEDLE) IMPLANT
REPL STRA BRUSH NEEDLE (NEEDLE) IMPLANT
RESERVOIR BACK FLUSH (MISCELLANEOUS) IMPLANT
ROLLS DENTAL (MISCELLANEOUS) ×4 IMPLANT
SCRAPER DIAMOND DUST MEMBRANE (MISCELLANEOUS) IMPLANT
SPONGE SURGIFOAM ABS GEL 12-7 (HEMOSTASIS) ×2 IMPLANT
STOPCOCK 4 WAY LG BORE MALE ST (IV SETS) IMPLANT
SUT CHROMIC 7 0 TG140 8 (SUTURE) IMPLANT
SUT ETHILON 10 0 CS140 6 (SUTURE) IMPLANT
SUT ETHILON 9 0 TG140 8 (SUTURE) IMPLANT
SUT POLY NON ABSORB 10-0 8 STR (SUTURE) IMPLANT
SUT SILK 4 0 RB 1 (SUTURE) IMPLANT
SYR 20CC LL (SYRINGE) ×2 IMPLANT
SYR 5ML LL (SYRINGE) IMPLANT
SYR BULB 3OZ (MISCELLANEOUS) ×2 IMPLANT
SYR TB 1ML LUER SLIP (SYRINGE) ×2 IMPLANT
SYRINGE 10CC LL (SYRINGE) IMPLANT
TAPE SURG TRANSPORE 1 IN (GAUZE/BANDAGES/DRESSINGS) IMPLANT
TAPE SURGICAL TRANSPORE 1 IN (GAUZE/BANDAGES/DRESSINGS) ×1
TOWEL OR 17X24 6PK STRL BLUE (TOWEL DISPOSABLE) ×6 IMPLANT
TROCAR CANNULA 25GA (CANNULA) IMPLANT
WATER STERILE IRR 1000ML POUR (IV SOLUTION) ×2 IMPLANT
WIPE INSTRUMENT VISIWIPE 73X73 (MISCELLANEOUS) ×2 IMPLANT

## 2012-04-04 NOTE — H&P (Signed)
I examined the patient today and there is no change in the medical status 

## 2012-04-04 NOTE — Procedures (Signed)
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  Assistant:  Rosalie Doctor SA    Anesthesia: General  Specimen: none  Estimated blood loss:  1cc  Complications: none  Patient sent to PACU in good condition  Composed by Sherrie George MD  Dictation number: [phone]

## 2012-04-04 NOTE — Transfer of Care (Signed)
Immediate Anesthesia Transfer of Care Note  Patient: Nathan Calderon  Procedure(s) Performed: Procedure(s) (LRB): PARS PLANA VITRECTOMY WITH 25 GAUGE (Left) MEMBRANE PEEL (Left)  Patient Location: PACU  Anesthesia Type: General  Level of Consciousness: awake, alert , oriented and patient cooperative  Airway & Oxygen Therapy: Patient Spontanous Breathing and Patient connected to nasal cannula oxygen  Post-op Assessment: Report given to PACU RN, Post -op Vital signs reviewed and stable and Patient moving all extremities X 4  Post vital signs: Reviewed and stable  Complications: No apparent anesthesia complications

## 2012-04-04 NOTE — H&P (Signed)
Nathan Calderon is an 51 y.o. male.   Chief Complaint: Loss of vision left eye HPI: Longstanding diabetic with vitreous hemorrhage and traction retinal detachment left eye  Past Medical History  Diagnosis Date  . Angina     2006, had cardiac cath at the time  . Diabetes mellitus   . Kidney stone     hx of  . Headache   . Hypertension   . Hyperlipidemia     Past Surgical History  Procedure Date  . Eye surgery     x 2  . Lasik   . Hernia repair     with undecended testicle  . Shoulder surgery     left shoulder    History reviewed. No pertinent family history. Social History:  reports that he has never smoked. He does not have any smokeless tobacco history on file. He reports that he drinks alcohol. He reports that he does not use illicit drugs.  Allergies: No Known Allergies  Medications Prior to Admission  Medication Sig Dispense Refill  . aspirin EC 81 MG tablet Take 81 mg by mouth daily.      Marland Kitchen atorvastatin (LIPITOR) 20 MG tablet Take 20 mg by mouth daily.      Marland Kitchen glyBURIDE-metformin (GLUCOVANCE) 5-500 MG per tablet Take 2 tablets by mouth 2 (two) times daily with a meal.      . insulin glargine (LANTUS SOLOSTAR) 100 UNIT/ML injection Inject 30 Units into the skin at bedtime.      Marland Kitchen lisinopril (PRINIVIL,ZESTRIL) 10 MG tablet Take 10 mg by mouth at bedtime.        Review of systems otherwise negative  Height 6\' 1"  (1.854 m), weight 235 lb (106.595 kg).  Physical exam: Mental status: oriented x3. Eyes: See eye exam associated with this date of surgery in media tab.  Scanned in by scanning center Ears, Nose, Throat: within normal limits Neck: Within Normal limits General: within normal limits Chest: Within normal limits Breast: deferred Heart: Within normal limits Abdomen: Within normal limits GU: deferred Extremities: within normal limits Skin: within normal limits  Assessment/Plan Longstanding diabetic with vitreous hemorrhage and traction retinal detachment  left eye  Plan: To [facility] for Pars plana vitrectomy left eye with laser and gas injection  Maika Mcelveen D 04/04/2012, 9:03 AM 2

## 2012-04-04 NOTE — Progress Notes (Signed)
Dr. Ladene Artist notified of EKG. No orders received.

## 2012-04-04 NOTE — Anesthesia Postprocedure Evaluation (Signed)
Anesthesia Post Note  Patient: Nathan Calderon  Procedure(s) Performed: Procedure(s) (LRB): PARS PLANA VITRECTOMY WITH 25 GAUGE (Left) MEMBRANE PEEL (Left)  Anesthesia type: General  Patient location: PACU  Post pain: Pain level controlled and Adequate analgesia  Post assessment: Post-op Vital signs reviewed, Patient's Cardiovascular Status Stable, Respiratory Function Stable, Patent Airway and Pain level controlled  Last Vitals:  Filed Vitals:   04/04/12 1457  BP:   Pulse:   Temp: 36.8 C  Resp:     Post vital signs: Reviewed and stable  Level of consciousness: awake, alert  and oriented  Complications: No apparent anesthesia complications

## 2012-04-04 NOTE — Anesthesia Preprocedure Evaluation (Signed)
Anesthesia Evaluation  Patient identified by MRN, date of birth, ID band Patient awake    Reviewed: Allergy & Precautions, H&P , NPO status , Patient's Chart, lab work & pertinent test results  Airway Mallampati: II  Neck ROM: full    Dental   Pulmonary          Cardiovascular hypertension, + angina     Neuro/Psych  Headaches,    GI/Hepatic   Endo/Other  Diabetes mellitus-  Renal/GU      Musculoskeletal   Abdominal   Peds  Hematology   Anesthesia Other Findings   Reproductive/Obstetrics                           Anesthesia Physical Anesthesia Plan  ASA: III  Anesthesia Plan: General   Post-op Pain Management:    Induction: Intravenous  Airway Management Planned: Oral ETT  Additional Equipment:   Intra-op Plan:   Post-operative Plan: Extubation in OR  Informed Consent: I have reviewed the patients History and Physical, chart, labs and discussed the procedure including the risks, benefits and alternatives for the proposed anesthesia with the patient or authorized representative who has indicated his/her understanding and acceptance.     Plan Discussed with: CRNA and Surgeon  Anesthesia Plan Comments:         Anesthesia Quick Evaluation

## 2012-04-04 NOTE — Preoperative (Signed)
Beta Blockers   Reason not to administer Beta Blockers:Not Applicable 

## 2012-04-05 ENCOUNTER — Ambulatory Visit (INDEPENDENT_AMBULATORY_CARE_PROVIDER_SITE_OTHER): Payer: BC Managed Care – PPO | Admitting: Ophthalmology

## 2012-04-05 ENCOUNTER — Encounter (HOSPITAL_COMMUNITY): Payer: Self-pay | Admitting: Ophthalmology

## 2012-04-05 DIAGNOSIS — E1139 Type 2 diabetes mellitus with other diabetic ophthalmic complication: Secondary | ICD-10-CM

## 2012-04-05 DIAGNOSIS — E11359 Type 2 diabetes mellitus with proliferative diabetic retinopathy without macular edema: Secondary | ICD-10-CM

## 2012-04-05 NOTE — Op Note (Signed)
NAMEHAYS, [name] NO.:  [id number]  MEDICAL RECORD NO.:  [id number]  LOCATION:  XRAY                         FACILITY:  [facility]  PHYSICIAN:  Beulah Gandy. Ashley Royalty, M.D. DATE OF BIRTH:  1961-08-24  DATE OF PROCEDURE:  04/04/2012 DATE OF DISCHARGE:                              OPERATIVE REPORT   ADMISSION DIAGNOSES:  Proliferative diabetic retinopathy, traction retinal detachment, vitreous hemorrhage, all in the left eye.  PROCEDURES:  Pars plana vitrectomy, retinal photocoagulation, membrane peel, endodiathermy, gas-fluid exchange, left eye.  SURGEON:  Beulah Gandy. Ashley Royalty, M.D.  ASSISTANT:  Rosalie Doctor, SA.  ANESTHESIA:  General.  DETAILS:  Usual prep and drape, 25-gauge trocars placed at 10, 2, and 4 o'clock.  Provisc placed on the corneal surface and the BIOM viewing system was moved into place.  Pars plana vitrectomy was begun just behind the cataractous lens, where large clots of blood were encountered, these were carefully removed under low suction and rapid cutting down to the macular surface.  A ring of neovascular adhesions to the retina was seen including the disk, upper arcade, lower arcade, and temporal retina.  This ring was carefully incised with the vitreous cutter and areas of vitreoretinal adhesion were circumcised and then cauterized with EndoCautery.  Membranes were peeled from their attachments to this ring and then the vitrectomy was carried into the mid-periphery.  Surface proliferation was peeled with the lighted pick, all surface proliferation was removed, then the vitrectomy was carried into the far periphery where vitreous hemorrhage was seen down to the vitreous base.  This was carefully removed with low suction and rapid cutting.  The scleral depression was then used for access to the vitreous base.  Once all blood was removed from the vitreous base, the Endolaser was positioned in the eye and 1125 burns placed around the retinal  periphery.  The power was 1000 mW, 1000 microns each, and 0.1 seconds each.  The retina was inspected and there were no breaks in the skin.  A gas-fluid exchange was carried out approximately 70%.  The wide- field BIOM viewing system was used for trimming of the vitreous base. The instruments were removed from the eye and the wounds were tested, they were found to be secured.  Polymyxin and gentamicin were irrigated into tenon space.  Marcaine was injected around the globe for postop pain.  Decadron 10 mg was injected into the lower subconjunctival space. Closing pressure was 10 with a Barraquer tonometer.  COMPLICATIONS:  None.  DURATION:  One hour.  The patient was awakened and taken to recovery in satisfactory condition.     Beulah Gandy. Ashley Royalty, M.D.     JDM/MEDQ  D:  04/04/2012  T:  04/04/2012  Job:  [id number]

## 2012-04-07 ENCOUNTER — Ambulatory Visit: Payer: BC Managed Care – PPO | Admitting: Endocrinology

## 2012-04-11 ENCOUNTER — Inpatient Hospital Stay (INDEPENDENT_AMBULATORY_CARE_PROVIDER_SITE_OTHER): Payer: BC Managed Care – PPO | Admitting: Ophthalmology

## 2012-04-11 DIAGNOSIS — E1139 Type 2 diabetes mellitus with other diabetic ophthalmic complication: Secondary | ICD-10-CM

## 2012-04-11 DIAGNOSIS — E11359 Type 2 diabetes mellitus with proliferative diabetic retinopathy without macular edema: Secondary | ICD-10-CM

## 2012-05-03 ENCOUNTER — Encounter (INDEPENDENT_AMBULATORY_CARE_PROVIDER_SITE_OTHER): Payer: BC Managed Care – PPO | Admitting: Ophthalmology

## 2012-05-05 ENCOUNTER — Encounter (INDEPENDENT_AMBULATORY_CARE_PROVIDER_SITE_OTHER): Payer: BC Managed Care – PPO | Admitting: Ophthalmology

## 2012-05-05 DIAGNOSIS — E11359 Type 2 diabetes mellitus with proliferative diabetic retinopathy without macular edema: Secondary | ICD-10-CM

## 2012-05-05 DIAGNOSIS — E1165 Type 2 diabetes mellitus with hyperglycemia: Secondary | ICD-10-CM

## 2012-07-14 ENCOUNTER — Encounter (INDEPENDENT_AMBULATORY_CARE_PROVIDER_SITE_OTHER): Payer: BC Managed Care – PPO | Admitting: Ophthalmology

## 2012-08-14 ENCOUNTER — Encounter (INDEPENDENT_AMBULATORY_CARE_PROVIDER_SITE_OTHER): Payer: BC Managed Care – PPO | Admitting: Ophthalmology

## 2012-08-14 DIAGNOSIS — I10 Essential (primary) hypertension: Secondary | ICD-10-CM

## 2012-08-14 DIAGNOSIS — E1139 Type 2 diabetes mellitus with other diabetic ophthalmic complication: Secondary | ICD-10-CM

## 2012-08-14 DIAGNOSIS — H3581 Retinal edema: Secondary | ICD-10-CM

## 2012-08-14 DIAGNOSIS — H35039 Hypertensive retinopathy, unspecified eye: Secondary | ICD-10-CM

## 2012-08-14 DIAGNOSIS — E1165 Type 2 diabetes mellitus with hyperglycemia: Secondary | ICD-10-CM

## 2012-08-14 DIAGNOSIS — H2510 Age-related nuclear cataract, unspecified eye: Secondary | ICD-10-CM

## 2012-08-14 DIAGNOSIS — E11359 Type 2 diabetes mellitus with proliferative diabetic retinopathy without macular edema: Secondary | ICD-10-CM

## 2012-08-14 DIAGNOSIS — H43819 Vitreous degeneration, unspecified eye: Secondary | ICD-10-CM

## 2012-08-18 ENCOUNTER — Encounter (INDEPENDENT_AMBULATORY_CARE_PROVIDER_SITE_OTHER): Payer: BC Managed Care – PPO | Admitting: Ophthalmology

## 2012-09-01 ENCOUNTER — Encounter (INDEPENDENT_AMBULATORY_CARE_PROVIDER_SITE_OTHER): Payer: BC Managed Care – PPO | Admitting: Ophthalmology

## 2012-09-01 DIAGNOSIS — E1165 Type 2 diabetes mellitus with hyperglycemia: Secondary | ICD-10-CM

## 2012-09-01 DIAGNOSIS — H3581 Retinal edema: Secondary | ICD-10-CM

## 2013-01-02 ENCOUNTER — Ambulatory Visit (INDEPENDENT_AMBULATORY_CARE_PROVIDER_SITE_OTHER): Payer: BC Managed Care – PPO | Admitting: Ophthalmology

## 2013-01-19 ENCOUNTER — Ambulatory Visit (INDEPENDENT_AMBULATORY_CARE_PROVIDER_SITE_OTHER): Payer: BC Managed Care – PPO | Admitting: Ophthalmology

## 2013-01-26 ENCOUNTER — Ambulatory Visit (INDEPENDENT_AMBULATORY_CARE_PROVIDER_SITE_OTHER): Payer: BC Managed Care – PPO | Admitting: Ophthalmology

## 2013-02-16 ENCOUNTER — Ambulatory Visit (INDEPENDENT_AMBULATORY_CARE_PROVIDER_SITE_OTHER): Payer: BC Managed Care – PPO | Admitting: Ophthalmology

## 2013-07-26 ENCOUNTER — Ambulatory Visit (HOSPITAL_BASED_OUTPATIENT_CLINIC_OR_DEPARTMENT_OTHER): Payer: BC Managed Care – PPO

## 2013-08-23 ENCOUNTER — Ambulatory Visit (HOSPITAL_BASED_OUTPATIENT_CLINIC_OR_DEPARTMENT_OTHER): Payer: BC Managed Care – PPO | Attending: Family Medicine

## 2013-08-23 VITALS — Ht 73.0 in | Wt 230.0 lb

## 2013-08-23 DIAGNOSIS — G4733 Obstructive sleep apnea (adult) (pediatric): Secondary | ICD-10-CM | POA: Insufficient documentation

## 2013-08-26 DIAGNOSIS — G4733 Obstructive sleep apnea (adult) (pediatric): Secondary | ICD-10-CM

## 2013-08-27 NOTE — Procedures (Signed)
NAMEFILIPPO, PULS                 ACCOUNT NO.:  [id number]  MEDICAL RECORD NO.:  [id number]          PATIENT TYPE:  OUT  LOCATION:  SLEEP CENTER                 FACILITY:  [facility]  PHYSICIAN:  Querida Beretta D. Maple Hudson, MD, FCCP, FACPDATE OF BIRTH:  June 09[birth year] 1961  DATE OF STUDY:  08/23/2013                           NOCTURNAL POLYSOMNOGRAM  REFERRING PHYSICIAN:  MAURA L HAMRICK  INDICATION FOR STUDY:  Hypersomnia with sleep apnea.  EPWORTH SLEEPINESS SCORE:  5/24.  BMI 30.3, weight 230 pounds, height 71 inches, neck 16 inches.  MEDICATIONS:  Home medications are charted for review.  SLEEP ARCHITECTURE:  Split-study protocol.  During the diagnostic phase, total sleep time 120.5 minutes with sleep efficiency 79.3%.  Stage I was 10.4%, stage II 89.6%, stages III and REM were absent.  Sleep latency 15 minutes, awake after sleep onset 17 minutes.  Arousal index 16.4. Bedtime medication:  None.  RESPIRATORY DATA:  Split-study protocol.  Apnea-hypopnea index (AHI) 61.7 per hour.  A total of 124 events was scored including 32 obstructive apneas, 3 central apneas, 67 hypopneas.  Events were not positional.  CPAP was titrated to 10 centimeters of water pressure, recording a few residual hypopneas.  He wore a medium ResMed AirFit F10 full-face mask with heated humidifier.  OXYGEN DATA:  Moderate snoring before CPAP with oxygen saturation nadir of 72%.  With CPAP control, snoring was prevented and mean oxygen saturation held 95.1% on room air.  CARDIAC DATA:  Sinus rhythm.  MOVEMENT-PARASOMNIA:  No significant movement disturbance.  Bathroom x1.  IMPRESSIONS-RECOMMENDATIONS: 1. Severe obstructive sleep apnea/hypopnea syndrome, apnea-hypopnea     index 61.7 per hour with non-positional events.  Moderate snoring     with oxygen desaturation to a nadir of 72% on room air. 2. Continuous positive airway pressure titration to a final     recommended pressure at 10 centimeters of water     pressure.  He  wore a medium ResMed AirFit F10 full-face mask with     heated humidifier.  Snoring was prevented and mean oxygen     saturation held 95.1% on room air with CPAP.     Leasia Swann D. Maple Hudson, MD, [facility], FACP Diplomate, American Board of Sleep Medicine    CDY/MEDQ  D:  08/26/2013 14:18:05  T:  08/27/2013 04:55:55  Job:  [id number]

## 2016-01-24 ENCOUNTER — Emergency Department: Payer: BLUE CROSS/BLUE SHIELD

## 2016-01-24 ENCOUNTER — Encounter: Payer: Self-pay | Admitting: Emergency Medicine

## 2016-01-24 ENCOUNTER — Emergency Department
Admission: EM | Admit: 2016-01-24 | Discharge: 2016-01-24 | Disposition: A | Discharge status: Home / Self Care | Payer: BLUE CROSS/BLUE SHIELD | Attending: Emergency Medicine | Admitting: Emergency Medicine

## 2016-01-24 DIAGNOSIS — E119 Type 2 diabetes mellitus without complications: Secondary | ICD-10-CM | POA: Diagnosis not present

## 2016-01-24 DIAGNOSIS — Z7982 Long term (current) use of aspirin: Secondary | ICD-10-CM | POA: Insufficient documentation

## 2016-01-24 DIAGNOSIS — Z7984 Long term (current) use of oral hypoglycemic drugs: Secondary | ICD-10-CM | POA: Insufficient documentation

## 2016-01-24 DIAGNOSIS — E871 Hypo-osmolality and hyponatremia: Secondary | ICD-10-CM | POA: Insufficient documentation

## 2016-01-24 DIAGNOSIS — R42 Dizziness and giddiness: Secondary | ICD-10-CM | POA: Diagnosis present

## 2016-01-24 DIAGNOSIS — R110 Nausea: Secondary | ICD-10-CM | POA: Diagnosis not present

## 2016-01-24 DIAGNOSIS — I10 Essential (primary) hypertension: Secondary | ICD-10-CM | POA: Insufficient documentation

## 2016-01-24 DIAGNOSIS — R002 Palpitations: Secondary | ICD-10-CM | POA: Insufficient documentation

## 2016-01-24 DIAGNOSIS — Z79899 Other long term (current) drug therapy: Secondary | ICD-10-CM | POA: Insufficient documentation

## 2016-01-24 DIAGNOSIS — F439 Reaction to severe stress, unspecified: Secondary | ICD-10-CM | POA: Insufficient documentation

## 2016-01-24 DIAGNOSIS — F419 Anxiety disorder, unspecified: Secondary | ICD-10-CM | POA: Insufficient documentation

## 2016-01-24 DIAGNOSIS — Z794 Long term (current) use of insulin: Secondary | ICD-10-CM | POA: Diagnosis not present

## 2016-01-24 LAB — BASIC METABOLIC PANEL
ANION GAP: 9 (ref 5–15)
Anion gap: 13 (ref 5–15)
Anion gap: 9 (ref 5–15)
BUN: 12 mg/dL (ref 6–20)
BUN: 11 mg/dL (ref 6–20)
BUN: 10 mg/dL (ref 6–20)
CALCIUM: 8.7 mg/dL — AB (ref 8.9–10.3)
CALCIUM: 7.9 mg/dL — AB (ref 8.9–10.3)
CALCIUM: 8 mg/dL — AB (ref 8.9–10.3)
CHLORIDE: 90 mmol/L — AB (ref 101–111)
CHLORIDE: 92 mmol/L — AB (ref 101–111)
CO2: 25 mmol/L (ref 22–32)
CO2: 24 mmol/L (ref 22–32)
CO2: 24 mmol/L (ref 22–32)
CREATININE: 0.97 mg/dL (ref 0.61–1.24)
CREATININE: 0.72 mg/dL (ref 0.61–1.24)
Chloride: 95 mmol/L — ABNORMAL LOW (ref 101–111)
Creatinine, Ser: 0.78 mg/dL (ref 0.61–1.24)
GFR calc Af Amer: >60 mL/min (ref >60)
GFR calc non Af Amer: >60 mL/min (ref >60)
GFR calc non Af Amer: >60 mL/min (ref >60)
GLUCOSE: 211 mg/dL — AB (ref 65–99)
GLUCOSE: 164 mg/dL — AB (ref 65–99)
Glucose, Bld: 173 mg/dL — ABNORMAL HIGH (ref 65–99)
Potassium: 2.8 mmol/L — CL (ref 3.5–5.1)
Potassium: 3.8 mmol/L (ref 3.5–5.1)
Potassium: 3.6 mmol/L (ref 3.5–5.1)
SODIUM: 125 mmol/L — AB (ref 135–145)
SODIUM: 128 mmol/L — AB (ref 135–145)
Sodium: 128 mmol/L — ABNORMAL LOW (ref 135–145)

## 2016-01-24 LAB — URINALYSIS COMPLETE WITH MICROSCOPIC (ARMC ONLY)
BILIRUBIN URINE: NEGATIVE
Bacteria, UA: NONE SEEN
GLUCOSE, UA: 50 mg/dL — AB
HGB URINE DIPSTICK: NEGATIVE
Leukocytes, UA: NEGATIVE
Nitrite: NEGATIVE
Protein, ur: 30 mg/dL — AB
Specific Gravity, Urine: 1.014 (ref 1.005–1.030)
pH: 5 (ref 5.0–8.0)

## 2016-01-24 LAB — HEPATIC FUNCTION PANEL
ALT: 30 U/L (ref 17–63)
AST: 27 U/L (ref 15–41)
Albumin: 4.3 g/dL (ref 3.5–5.0)
Alkaline Phosphatase: 43 U/L (ref 38–126)
TOTAL PROTEIN: 7.4 g/dL (ref 6.5–8.1)
Total Bilirubin: 0.8 mg/dL (ref 0.3–1.2)

## 2016-01-24 LAB — CBC
HCT: 38.9 % — ABNORMAL LOW (ref 40.0–52.0)
Hemoglobin: 14 g/dL (ref 13.0–18.0)
MCH: 32.1 pg (ref 26.0–34.0)
MCHC: 35.9 g/dL (ref 32.0–36.0)
MCV: 89.5 fL (ref 80.0–100.0)
PLATELETS: 226 10*3/uL (ref 150–440)
RBC: 4.35 MIL/uL — AB (ref 4.40–5.90)
RDW: 12.2 % (ref 11.5–14.5)
WBC: 7.6 10*3/uL (ref 3.8–10.6)

## 2016-01-24 LAB — MAGNESIUM
MAGNESIUM: 1.4 mg/dL — AB (ref 1.7–2.4)
MAGNESIUM: 1.9 mg/dL (ref 1.7–2.4)

## 2016-01-24 LAB — TROPONIN I

## 2016-01-24 LAB — ETHANOL: Alcohol, Ethyl (B): <5 mg/dL (ref <5)

## 2016-01-24 MED ORDER — AMLODIPINE BESYLATE 5 MG PO TABS
5.0000 mg | ORAL_TABLET | Freq: Once | ORAL | Status: AC
  Administered 2016-01-24: 5 mg via ORAL
  Filled 2016-01-24: qty 1

## 2016-01-24 MED ORDER — THIAMINE HCL 100 MG/ML IJ SOLN
100.0000 mg | Freq: Once | INTRAMUSCULAR | Status: AC
  Administered 2016-01-24: 100 mg via INTRAVENOUS
  Filled 2016-01-24: qty 2

## 2016-01-24 MED ORDER — SODIUM CHLORIDE 0.9 % IV BOLUS (SEPSIS)
1000.0000 mL | Freq: Once | INTRAVENOUS | Status: AC
  Administered 2016-01-24: 1000 mL via INTRAVENOUS

## 2016-01-24 MED ORDER — POTASSIUM CHLORIDE 10 MEQ/100ML IV SOLN
10.0000 meq | Freq: Once | INTRAVENOUS | Status: AC
  Administered 2016-01-24: 10 meq via INTRAVENOUS
  Filled 2016-01-24: qty 100

## 2016-01-24 MED ORDER — ONDANSETRON HCL 4 MG PO TABS: 4.0000 mg | ORAL_TABLET | Freq: Three times a day (TID) | ORAL | Status: DC | PRN

## 2016-01-24 MED ORDER — MAGNESIUM SULFATE 2 GM/50ML IV SOLN
2.0000 g | Freq: Once | INTRAVENOUS | Status: AC
  Administered 2016-01-24: 2 g via INTRAVENOUS
  Filled 2016-01-24: qty 50

## 2016-01-24 MED ORDER — LORAZEPAM 2 MG/ML IJ SOLN
1.0000 mg | Freq: Once | INTRAMUSCULAR | Status: AC
  Administered 2016-01-24: 1 mg via INTRAVENOUS
  Filled 2016-01-24: qty 1

## 2016-01-24 MED ORDER — SODIUM CHLORIDE 0.9 % IV SOLN: 1.0000 mg | Freq: Once | INTRAVENOUS | Status: DC

## 2016-01-24 MED ORDER — POTASSIUM CHLORIDE CRYS ER 20 MEQ PO TBCR
40.0000 meq | EXTENDED_RELEASE_TABLET | Freq: Once | ORAL | Status: AC
  Administered 2016-01-24: 40 meq via ORAL
  Filled 2016-01-24: qty 2

## 2016-01-24 MED ORDER — AMLODIPINE BESYLATE 5 MG PO TABS: 5.0000 mg | ORAL_TABLET | Freq: Every day | ORAL | Status: DC

## 2016-01-24 MED ORDER — FOLIC ACID 1 MG PO TABS
1.0000 mg | ORAL_TABLET | Freq: Once | ORAL | Status: AC
  Administered 2016-01-24: 1 mg via ORAL
  Filled 2016-01-24: qty 1

## 2016-01-24 NOTE — ED Provider Notes (Addendum)
[facility] Emergency Department Provider Note  ____________________________________________   I have reviewed the triage vital signs and the nursing notes.   HISTORY  Chief Complaint Irregular Heart Beat; Dizziness; and Nausea    HPI Nathan Calderon is a 55 y.o. male history of diabetes, hypertension, anxiety, he states he has had 2 cardiac catheterizations 1 and 2001 2006 for chest pain neither which found any actionable lesions. Patient was not placed on Plavix or aspirin for this. He states that he is here today because he felt lightheaded. Patient did not have anything to eat or drink aside from beer after noon yesterday. There are 9:00 after having "maybe 3 beers" he became somewhat lightheaded especially when he bent over and stood up rapidly. He had no chest pain or shortness of breath but he felt like his heart was going quickly. No leg swelling,. Patient states he is very stressed because he just finished a divorce, he also is unhappy at work, however, he has no SI or HI. This time, he states he is a somewhat tired and a little lightheaded. He has had slight nausea but no vomiting.  Past Medical History  Diagnosis Date  . Angina     2006, had cardiac cath at the time  . Diabetes mellitus   . Kidney stone     hx of  . Headache(784.0)   . Hypertension   . Hyperlipidemia     Patient Active Problem List   Diagnosis Date Noted  . 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 Vitreous hemorrhage ([place]) 04/04/2012  . Proliferative diabetic retinopathy(362.02) 04/04/2012    Past Surgical History  Procedure Laterality Date  . Eye surgery      x 2  . Lasik    . Hernia repair      with undecended testicle  . Shoulder surgery      left shoulder  . Pars plana vitrectomy  04/04/2012    Procedure: PARS PLANA VITRECTOMY WITH 25 GAUGE;  Surgeon: [name], MD;  Location: [facility];  Service: Ophthalmology;  Laterality: Left;  with laser    Current Outpatient  Rx  Name  Route  Sig  Dispense  Refill  . aspirin EC 81 MG tablet   Oral   Take 81 mg by mouth daily.         Marland Kitchen atorvastatin (LIPITOR) 20 MG tablet   Oral   Take 20 mg by mouth daily.         Marland Kitchen glyBURIDE-metformin (GLUCOVANCE) 5-500 MG per tablet   Oral   Take 2 tablets by mouth 2 (two) times daily with a meal.         . insulin glargine (LANTUS SOLOSTAR) 100 UNIT/ML injection   Subcutaneous   Inject 30 Units into the skin at bedtime.         Marland Kitchen lisinopril (PRINIVIL,ZESTRIL) 10 MG tablet   Oral   Take 10 mg by mouth at bedtime.           Allergies Review of patient's allergies indicates no known allergies.  No family history on file.  Social History Social History  Substance Use Topics  . Smoking status: Never Smoker   . Smokeless tobacco: None  . Alcohol Use: 12.6 oz/week    21 Cans of beer per week    Review of Systems Constitutional: No fever/chills Eyes: No visual changes. ENT: No sore throat. No stiff neck no neck pain Cardiovascular: Denies chest pain. Respiratory: Denies shortness of breath.  Gastrointestinal:   no vomiting.  No diarrhea.  No constipation. Genitourinary: Negative for dysuria. Musculoskeletal: Negative lower extremity swelling Skin: Negative for rash. Neurological: Negative for headaches, focal weakness or numbness. 10-point ROS otherwise negative.  ____________________________________________   PHYSICAL EXAM:  VITAL SIGNS: ED Triage Vitals  Enc Vitals Group     BP 01/24/16 0244 188/78 mmHg     Pulse Rate 01/24/16 0241 94     Resp 01/24/16 0241 24     Temp 01/24/16 0241 97.8 F (36.6 C)     Temp Source 01/24/16 0241 Oral     SpO2 01/24/16 0241 98 %     Weight 01/24/16 0241 250 lb (113.399 kg)     Height 01/24/16 0241 6\' 1"  (1.854 m)     Head Cir --      Peak Flow --      Pain Score --      Pain Loc --      Pain Edu? --      Excl. in [place]? --     Constitutional: Alert and oriented. Well appearing and in no acute  distress., Somewhat anxious Eyes: Conjunctivae are normal. PERRL. EOMI. Head: Atraumatic. Nose: No congestion/rhinnorhea. Mouth/Throat: Mucous membranes are moist.  Oropharynx non-erythematous. Neck: No stridor.   Nontender with no meningismus Cardiovascular: Normal rate, regular rhythm. Grossly normal heart sounds.  Good peripheral circulation. Respiratory: Normal respiratory effort.  No retractions. Lungs CTAB. Abdominal: Soft and nontender. No distention. No guarding no rebound Back:  There is no focal tenderness or step off there is no midline tenderness there are no lesions noted. there is no CVA tenderness Musculoskeletal: No lower extremity tenderness. No joint effusions, no DVT signs strong distal pulses no edema Neurologic:  Normal speech and language. No gross focal neurologic deficits are appreciated.  Skin:  Skin is warm, dry and intact. No rash noted. Psychiatric: Mood and affect are anxious. Speech and behavior are normal.  ____________________________________________   LABS (all labs ordered are listed, but only abnormal results are displayed)  Labs Reviewed  BASIC METABOLIC PANEL - Abnormal; Notable for the following:    Sodium 128 (*)    Potassium 2.8 (*)    Chloride 90 (*)    Glucose, Bld 211 (*)    Calcium 8.7 (*)    All other components within normal limits  CBC - Abnormal; Notable for the following:    RBC 4.35 (*)    HCT 38.9 (*)    All other components within normal limits  URINALYSIS COMPLETEWITH MICROSCOPIC ([facility] ONLY) - Abnormal; Notable for the following:    Color, Urine YELLOW (*)    APPearance CLEAR (*)    Glucose, UA 50 (*)    Ketones, ur TRACE (*)    Protein, ur 30 (*)    Squamous Epithelial / LPF 0-5 (*)    All other components within normal limits  TROPONIN I  MAGNESIUM  HEPATIC FUNCTION PANEL  ETHANOL   ____________________________________________  EKG  I personally interpreted any EKGs ordered by me or triage Normal sinus rhythm at  92 bpm no acute ST elevation or acute ST depression normal axis unremarkable EKG ____________________________________________  RADIOLOGY  I reviewed any imaging ordered by me or triage that were performed during my shift ____________________________________________   PROCEDURES  Procedure(s) performed: None  Critical Care performed: None  ____________________________________________   INITIAL IMPRESSION / ASSESSMENT AND PLAN / ED COURSE  Pertinent labs & imaging results that were available during my care of the patient  were reviewed by me and considered in my medical decision making (see chart for details).  Patient here presenting today complaining of feeling stressed and lightheaded. He has had nothing to eat today since 15 hours ago. He has had at least 3 beers by his own account and nothing else really. No other acute indication at this time that the patient is unwell. Sodium is low and so is his potassium. We will replete the sodium with IV fluid and the potassium with potassium. Patient is a daily drinker it appears. No history of alcohol withdrawal. We'll check liver function test. We will check magnesium. I'll give him thiamine and folate. I'll give him Ativan and encouraged him to eat. We'll reassess closely. Nothing at this time to suggest PE dissection ACS myocarditis endocarditis pericarditis pneumonia, pneumothorax, or intra-abdominal pathology of any significance.  ----------------------------------------- 6:34 AM on 01/24/2016 -----------------------------------------  As one can see from patient's blood pressure he is much more relaxed and calm now since he has been here for a while. The Ativan did seem to help him however he states he still feels nauseated although he has not yet vomited and he states he still feels lightheaded despite IV fluid. We did notice multiple different electrolyte abnormalities. We have done our best to correct them here. Patient states he  drinks a "hell a lot" of water. His hyponatremia could be secondary to that. Nonetheless his sodium is gone down as above. After IV fluid, I think this is likely secondary to HCTZ and polydipsia possibly but in any event, patient states he feels too unwell to go home in his sodium is 125 we will bring him in for gradual correction of his hyponatremia if the hospitalist agrees with the plan.  ----------------------------------------- 6:58 AM on 01/24/2016 -----------------------------------------  Discussed his case with the hospitalist service, they would prefer to see if I can recheck his sodium as it does not make sense that it would go down after IV hydration. We will give another liter fluid and recheck his sodium. If that is negative, it is their preference and my hope that we can get him home safely. Signed out to dr. Reita Cliche at the end of my shift.  ____________________________________________   FINAL CLINICAL IMPRESSION(S) / ED DIAGNOSES  Final diagnoses:  None      This chart was dictated using voice recognition software.  Despite best efforts to proofread,  errors can occur which can change meaning.     Schuyler Amor, MD 01/24/16 [id number]  Schuyler Amor, MD 01/24/16 [place], MD 01/24/16 [place], MD 01/24/16 [phone]

## 2016-01-24 NOTE — ED Notes (Signed)
NAD noted at time of D/C. Pt denies questions or concerns. Pt taken to the lobby at this time via wheelchair by his girlfriend.

## 2016-01-24 NOTE — ED Notes (Signed)
Patient states that around 21:00 last night he started feeling dizzy, nauseated and heaviness in his left arm.

## 2016-01-24 NOTE — Discharge Instructions (Signed)
Hyponatremia: low sodium -- do not drink excess water. Follow up with your doctor or [facility] for a recheck of your sodium level in about 2 days, stop taking the hctz and take the norvasc instead as directed. If you have chest pain shortness of breath, fever, vomiting, abdominal pain, or you feel worse in any way, return to the emergency department. Do not drink alcohol to excess.    Hyponatremia is when the amount of salt (sodium) in your blood is too low. When sodium levels are low, your cells absorb extra water and they swell. The swelling happens throughout the body, but it mostly affects the brain. CAUSES This condition may be caused by:  Heart, kidney, or liver problems.  Thyroid problems.  Adrenal gland problems.  Metabolic conditions, such as syndrome of inappropriate antidiuretic hormone (SIADH).  Severe vomiting and diarrhea.  Certain medicines or illegal drugs.  Dehydration.  Drinking too much water.  Eating a diet that is low in sodium.  Large burns on your body.  Sweating. RISK FACTORS This condition is more likely to develop in people who:  Have long-term (chronic) kidney disease.  Have heart failure.  Have a medical condition that causes frequent or excessive diarrhea.  Have metabolic conditions, such as Addison disease or SIADH.  Take certain medicines that affect the sodium and fluid balance in the blood. Some of these medicine types include:  Diuretics.  NSAIDs.  Some opioid pain medicines.  Some antidepressants.  Some seizure prevention medicines. SYMPTOMS  Symptoms of this condition include:  Nausea and vomiting.  Confusion.  Lethargy.  Agitation.  Headache.  Seizures.  Unconsciousness.  Appetite loss.  Muscle weakness and cramping.  Feeling weak or light-headed.  Having a rapid heart rate.  Fainting, in severe cases. DIAGNOSIS This condition is diagnosed with a medical history and physical exam. You will also  have other tests, including:  Blood tests.  Urine tests. TREATMENT Treatment for this condition depends on the cause. Treatment may include:  Fluids given through an IV tube that is inserted into one of your veins.  Medicines to correct the sodium imbalance. If medicines are causing the condition, the medicines will need to be adjusted.  Limiting water or fluid intake to get the correct sodium balance. HOME CARE INSTRUCTIONS  Take medicines only as directed by your health care provider. Many medicines can make this condition worse. Talk with your health care provider about any medicines that you are currently taking.  Carefully follow a recommended diet as directed by your health care provider.  Carefully follow instructions from your health care provider about fluid restrictions.  Keep all follow-up visits as directed by your health care provider. This is important.  Do not drink alcohol. SEEK MEDICAL CARE IF:  You develop worsening nausea, fatigue, headache, confusion, or weakness.  Your symptoms go away and then return.  You have problems following the recommended diet. SEEK IMMEDIATE MEDICAL CARE IF:  You have a seizure.  You faint.  You have ongoing diarrhea or vomiting.   This information is not intended to replace advice given to you by your health care provider. Make sure you discuss any questions you have with your health care provider.   Document Released: 11/05/2002 Document Revised: 04/01/2015 Document Reviewed: 12/05/2014 Elsevier Interactive Patient Education [employer].

## 2016-01-24 NOTE — ED Provider Notes (Signed)
[facility]  I accepted care from Dr. Devra Dopp ____________________________________________    LABS (pertinent positives/negatives)  Repeat metabolic panel significant for sodium 128, potassium 3.6   ____________________________________________    RADIOLOGY All xrays were viewed by me. Imaging interpreted by radiologist.  None  ____________________________________________   PROCEDURES  Procedure(s) performed: None  Critical Care performed: None  ____________________________________________   INITIAL IMPRESSION / ASSESSMENT AND PLAN / ED COURSE   Pertinent labs & imaging results that were available during my care of the patient were reviewed by me and considered in my medical decision making (see chart for details).  Patient was able to eat and drink this morning, without vomiting. He reports some persistent nausea and I will send him with a prescription for Zofran.  In terms of his hyponatremia, discharge/signout plan was to repeat metabolic panel to make sure her sodium level was not dropping. His sodium level was 128. I think patient is okay for outpatient discharge and follow-up closely early this next week.  CONSULTATIONS: None    Patient / Family / Caregiver informed of clinical course, medical decision-making process, and agree with plan.   I discussed return precautions, follow-up instructions, and discharged instructions with patient and/or family.     ____________________________________________   FINAL CLINICAL IMPRESSION(S) / ED DIAGNOSES  Final diagnoses:  Hyponatremia  Palpitations  Nausea        Lisa Roca, MD 01/24/16 [phone]

## 2016-03-26 ENCOUNTER — Ambulatory Visit (INDEPENDENT_AMBULATORY_CARE_PROVIDER_SITE_OTHER): Payer: BLUE CROSS/BLUE SHIELD | Admitting: Urology

## 2016-03-26 ENCOUNTER — Ambulatory Visit: Payer: Self-pay

## 2016-03-26 ENCOUNTER — Encounter: Payer: Self-pay | Admitting: Urology

## 2016-03-26 VITALS — BP 178/82 | HR 91 | Ht 71.0 in | Wt 256.4 lb

## 2016-03-26 DIAGNOSIS — N529 Male erectile dysfunction, unspecified: Secondary | ICD-10-CM

## 2016-03-26 DIAGNOSIS — D4011 Neoplasm of uncertain behavior of right testis: Secondary | ICD-10-CM

## 2016-03-26 DIAGNOSIS — Z87442 Personal history of urinary calculi: Secondary | ICD-10-CM | POA: Diagnosis not present

## 2016-03-26 DIAGNOSIS — N50819 Testicular pain, unspecified: Secondary | ICD-10-CM | POA: Diagnosis not present

## 2016-03-26 LAB — URINALYSIS, COMPLETE
Bilirubin, UA: NEGATIVE
Ketones, UA: NEGATIVE
LEUKOCYTES UA: NEGATIVE
NITRITE UA: NEGATIVE
Specific Gravity, UA: 1.02 (ref 1.005–1.030)
Urobilinogen, Ur: 0.2 mg/dL (ref 0.2–1.0)
pH, UA: 6 (ref 5.0–7.5)

## 2016-03-26 LAB — MICROSCOPIC EXAMINATION
BACTERIA UA: NONE SEEN
EPITHELIAL CELLS (NON RENAL): NONE SEEN /HPF (ref 0–10)
WBC UA: NONE SEEN /HPF (ref 0–?)

## 2016-03-26 MED ORDER — ALPROSTADIL (VASODILATOR) 1000 MCG UR PLLT: 1000.0000 ug | PELLET | URETHRAL | Status: DC | PRN

## 2016-03-26 NOTE — Progress Notes (Signed)
03/26/2016 1:03 PM   Nathan Calderon 08-10-1961 [id number]  Referring provider: No referring provider defined for this encounter.  Chief Complaint  Patient presents with  . Testicle Pain    referred by Charlott Holler    HPI: 55 year old male who presents today for multiple issues including right testicular pain, ED, and history of kidney stones.  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 He also has a history of right inguinal hernia repair (?mesh).   Over the past few months, he's had some right testicular discomfort, especially when lying on the right side at nighttime. He has also noted a mass on that side.  Erectile dysfunction Patient is a long history of erectile dysfunction. He is tried PDE 5 inhibitors in the past including Viagra without any effect. Recently, he has been on Muse 250 mg with some subtle fullness of his phallus but no full erection. He's had no associated penile pain. He's also been seen and evaluated by Dr. Risa Grill in the past and on intracavernosal injections with fairly good success. He prefers to right urethral suppositories.  Libido is intact.  History of kidney stones History of nonobstructing kidney stones. He has not had abdominal/renal imaging in quite some time. No recent flank pain. No recent gross hematuria.    PMH: Past Medical History  Diagnosis Date  . Angina     2006, had cardiac cath at the time  . Diabetes mellitus   . Kidney stone     hx of  . Headache(784.0)   . Hypertension   . Hyperlipidemia   . Anxiety   . Sleep apnea     Surgical History: Past Surgical History  Procedure Laterality Date  . Eye surgery      x 2  . Lasik    . Hernia repair      with undecended testicle  . Shoulder surgery      left shoulder  . Pars plana vitrectomy  04/04/2012    Procedure: PARS PLANA VITRECTOMY WITH 25 GAUGE;  Surgeon: [name], MD;  Location: [facility];  Service:  Ophthalmology;  Laterality: Left;  with laser    Home Medications:    Medication List       This list is accurate as of: 03/26/16 11:59 PM.  Always use your most recent med list.               ALPRAZolam 0.5 MG tablet  Commonly known as:  XANAX  Take 0.5 mg by mouth every 6 (six) hours as needed. for anxiety     amLODipine 10 MG tablet  Commonly known as:  NORVASC  Take 10 mg by mouth daily.     aspirin EC 81 MG tablet  Take 81 mg by mouth daily.     atorvastatin 20 MG tablet  Commonly known as:  LIPITOR  Take 20 mg by mouth daily.     BAYER CONTOUR NEXT TEST test strip  Generic drug:  glucose blood  TEST TWO TIMES DAILY     glimepiride 2 MG tablet  Commonly known as:  AMARYL  TAKE 1 TABLET BY MOUTH DAILY WITH FIRST MEAL     losartan 100 MG tablet  Commonly known as:  COZAAR  Take 100 mg by mouth daily.     metFORMIN 1000 MG tablet  Commonly known as:  GLUCOPHAGE  Take 1,000 mg by mouth 2 (two) times daily with a meal.  MUSE 250 MCG pellet  Generic drug:  alprostadil  1 SUPPOSITORY INTO URETHRA INTRAURETHRALLY BEFORE SEXUAL INTERCOURSE     alprostadil 1000 MCG pellet  Commonly known as:  MUSE  1 each (1,000 mcg total) by Transurethral route as needed for erectile dysfunction. use no more than 3 times per week     ondansetron 4 MG tablet  Commonly known as:  ZOFRAN  Take 1 tablet (4 mg total) by mouth every 8 (eight) hours as needed for nausea or vomiting.     TOUJEO SOLOSTAR 300 UNIT/ML Sopn  Generic drug:  Insulin Glargine  Inject 65 Units into the skin every evening.        Allergies: No Known Allergies  Family History: Family History  Problem Relation Age of Onset  . Kidney disease Neg Hx   . Prostate cancer Neg Hx   . Diabetes Mother     Social History:  reports that he has never smoked. He does not have any smokeless tobacco history on file. He reports that he drinks about 12.6 oz of alcohol per week. He reports that he does not use  illicit drugs.  ROS: UROLOGY Frequent Urination?: No Hard to postpone urination?: No Burning/pain with urination?: No Get up at night to urinate?: Yes Leakage of urine?: No Urine stream starts and stops?: No Trouble starting stream?: No Do you have to strain to urinate?: No Blood in urine?: No Urinary tract infection?: No Sexually transmitted disease?: No Injury to kidneys or bladder?: No Painful intercourse?: No Weak stream?: No Erection problems?: Yes Penile pain?: No  Gastrointestinal Nausea?: No Vomiting?: No Indigestion/heartburn?: No Diarrhea?: No Constipation?: No  Constitutional Fever: No Night sweats?: No Weight loss?: No Fatigue?: No  Skin Skin rash/lesions?: No Itching?: No  Eyes Blurred vision?: No Double vision?: No  Ears/Nose/Throat Sore throat?: No Sinus problems?: No  Hematologic/Lymphatic Swollen glands?: No Easy bruising?: No  Cardiovascular Leg swelling?: Yes Chest pain?: No  Respiratory Cough?: No Shortness of breath?: No  Endocrine Excessive thirst?: No  Musculoskeletal Back pain?: Yes Joint pain?: No  Neurological Headaches?: Yes Dizziness?: No  Psychologic Depression?: No Anxiety?: Yes  Physical Exam: BP 178/82 mmHg  Pulse 91  Ht 5\' 11"  (1.803 m)  Wt 256 lb 6.4 oz (116.302 kg)  BMI 35.78 kg/m2  Constitutional:  Alert and oriented, No acute distress. HEENT:  AT, moist mucus membranes.  Trachea midline, no masses. Cardiovascular: No clubbing, cyanosis, or edema. Respiratory: Normal respiratory effort, no increased work of breathing. GI: Abdomen is soft, nontender, nondistended, no abdominal masses GU: No CVA tenderness. Right testicle firm, nodular, nontender, highly concerning for intratesticular mass.  Left testicle normal.  No scrotal skin changes. Circumcised phallus with orthotopic meatus. Skin: No rashes, bruises or suspicious lesions. Lymph: No cervical or inguinal adenopathy. Neurologic: Grossly  intact, no focal deficits, moving all 4 extremities. Psychiatric: Normal mood and affect.  Laboratory Data: Lab Results  Component Value Date   WBC 7.6 01/24/2016   HGB 14.0 01/24/2016   HCT 38.9* 01/24/2016   MCV 89.5 01/24/2016   PLT 226 01/24/2016    Lab Results  Component Value Date   CREATININE 0.78 01/24/2016    Urinalysis Results for orders placed or performed in visit on 03/26/16  Microscopic Examination  Result Value Ref Range   WBC, UA None seen 0 -  5 /hpf   RBC, UA 3-10 (A) 0 -  2 /hpf   Epithelial Cells (non renal) None seen 0 - 10 /hpf  Bacteria, UA None seen None seen/Few  Urinalysis, Complete  Result Value Ref Range   Specific Gravity, UA 1.020 1.005 - 1.030   pH, UA 6.0 5.0 - 7.5   Color, UA Yellow Yellow   Appearance Ur Clear Clear   Leukocytes, UA Negative Negative   Protein, UA 1+ (A) Negative/Trace   Glucose, UA 2+ (A) Negative   Ketones, UA Negative Negative   RBC, UA Trace (A) Negative   Bilirubin, UA Negative Negative   Urobilinogen, Ur 0.2 0.2 - 1.0 mg/dL   Nitrite, UA Negative Negative   Microscopic Examination See below:     Pertinent Imaging: Scrotal ultrasound ordered today  Assessment & Plan:   1. Testicular pain Right firm intratesticular mass highly concerning for malignancy. He also has a history of right undescended testicle status post orchidopexy at age 39 which increases his risk for malignancy.   Patient made aware of concerns and findings on exam. I would like him to have a scrotal ultrasound to confirm this prior to right radical orchiectomy.    He did go ahead and discuss the surgery today in detail including the preoperative intraoperative and postoperative details. The risks of the procedure were discussed in detail as well. Further workup with tumor markers, staging CT scan, were also discussed.  - Urinalysis, Complete - US Art/Ven Flow Abd Pelv Doppler; Future - US Scrotum; Future - Microscopic Examination  2.  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 Erectile dysfunction, unspecified erectile dysfunction type History of refractory erectile dysfunction with minimal success with Muse (250 mcg).  Since his artery bypass medication, he would like to try to pellets to see if it any more efficacious. He prefers this modality over intracavernosal injections which he is tried previously. If he fails higher dose, we will try the max dose which was prescribed today.  If the 1000 mcg doses not effective, we will: Prescription for tri-mix to compounding pharmacy. He is already familiar with how to self inject.  4. History of kidney stones History of kidney stones requiring intervention. Recommend KUB today to assess overall stone burden. - DG Abd 1 View; Future   Return for ASAP after scrotal utlrasound.  [name], MD  [facility] [address] [place], [place] [address] [phone]  I spent 45 min with this patient of which greater than 50% was spent in counseling and coordination of care with the patient.

## 2016-03-31 ENCOUNTER — Ambulatory Visit
Admission: RE | Admit: 2016-03-31 | Discharge: 2016-03-31 | Disposition: A | Discharge status: Home / Self Care | Payer: BLUE CROSS/BLUE SHIELD | Source: Ambulatory Visit | Attending: Urology | Admitting: Urology

## 2016-03-31 DIAGNOSIS — I861 Scrotal varices: Secondary | ICD-10-CM | POA: Diagnosis not present

## 2016-03-31 DIAGNOSIS — N200 Calculus of kidney: Secondary | ICD-10-CM | POA: Insufficient documentation

## 2016-03-31 DIAGNOSIS — N432 Other hydrocele: Secondary | ICD-10-CM | POA: Insufficient documentation

## 2016-03-31 DIAGNOSIS — D4011 Neoplasm of uncertain behavior of right testis: Secondary | ICD-10-CM | POA: Insufficient documentation

## 2016-03-31 DIAGNOSIS — Z87442 Personal history of urinary calculi: Secondary | ICD-10-CM | POA: Insufficient documentation

## 2016-03-31 DIAGNOSIS — N50819 Testicular pain, unspecified: Secondary | ICD-10-CM | POA: Diagnosis present

## 2016-04-02 ENCOUNTER — Encounter: Payer: Self-pay | Admitting: Urology

## 2016-04-02 ENCOUNTER — Ambulatory Visit (INDEPENDENT_AMBULATORY_CARE_PROVIDER_SITE_OTHER): Payer: BLUE CROSS/BLUE SHIELD | Admitting: Urology

## 2016-04-02 VITALS — BP 184/78 | HR 97 | Ht 73.0 in | Wt 258.0 lb

## 2016-04-02 DIAGNOSIS — Z3009 Encounter for other general counseling and advice on contraception: Secondary | ICD-10-CM

## 2016-04-02 DIAGNOSIS — D4011 Neoplasm of uncertain behavior of right testis: Secondary | ICD-10-CM | POA: Diagnosis not present

## 2016-04-02 DIAGNOSIS — Z87442 Personal history of urinary calculi: Secondary | ICD-10-CM

## 2016-04-02 DIAGNOSIS — D4959 Neoplasm of unspecified behavior of other genitourinary organ: Secondary | ICD-10-CM | POA: Diagnosis not present

## 2016-04-02 DIAGNOSIS — Z309 Encounter for contraceptive management, unspecified: Secondary | ICD-10-CM | POA: Diagnosis not present

## 2016-04-03 LAB — AFP TUMOR MARKER: AFP-Tumor Marker: 2.2 ng/mL (ref 0.0–8.3)

## 2016-04-03 LAB — LACTATE DEHYDROGENASE: LDH: 203 IU/L (ref 121–224)

## 2016-04-03 LAB — BETA HCG QUANT (REF LAB): hCG Quant: <1 m[IU]/mL (ref 0–3)

## 2016-04-03 NOTE — Progress Notes (Signed)
3:47 PM  04/02/16  Nathan Calderon 08-04-1961 [id number]   Chief Complaint  Patient presents with  . Testicle Pain    scrotal u/s results  . Results    HPI: 55 year old male who presents today for multiple issues including right testicular pain, ED, and history of kidney stones.  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 He also has a history of right inguinal hernia repair (?mesh).   Over the past few months, he's had some right testicular discomfort, especially when lying on the right side at nighttime. He has also noted a mass on that side.  He returns today with follow-up scrotal ultrasound which does show a solid tumor within the right testicle, heterogeneous, measuring 2.5 cm highly concerning for testicular carcinoma.  His left testicle is normal.  Erectile dysfunction Patient is a long history of erectile dysfunction. He is tried PDE 5 inhibitors in the past including Viagra without any effect. Recently, he has been on Muse 250 mg with some subtle fullness of his phallus but no full erection. He's had no associated penile pain. He's also been seen and evaluated by Dr. Risa Grill in the past and on intracavernosal injections with fairly good success. He prefers urthrethral suppositories.  Libido is intact.  ED not discussed today.    History of kidney stones History of nonobstructing kidney stones. He has not had abdominal/renal imaging in quite some time. No recent flank pain. No recent gross hematuria.    KUB today shows stable nonobstructing left-sided stones 2, no greater than 5 mm. Review of previous imaging shows that these 2 calcifications have been stable for approximally 10 years or greater.  PMH: Past Medical History  Diagnosis Date  . Angina     2006, had cardiac cath at the time  . Diabetes mellitus   . Kidney stone     hx of  . Headache(784.0)   . Hypertension   . Hyperlipidemia   . Anxiety     . Sleep apnea     Surgical History: Past Surgical History  Procedure Laterality Date  . Eye surgery      x 2  . Lasik    . Hernia repair      with undecended testicle  . Shoulder surgery      left shoulder  . Pars plana vitrectomy  04/04/2012    Procedure: PARS PLANA VITRECTOMY WITH 25 GAUGE;  Surgeon: [name], MD;  Location: [facility];  Service: Ophthalmology;  Laterality: Left;  with laser    Home Medications:    Medication List       This list is accurate as of: 04/02/16 11:59 PM.  Always use your most recent med list.               ALPRAZolam 0.5 MG tablet  Commonly known as:  XANAX  Take 0.5 mg by mouth every 6 (six) hours as needed. for anxiety     amLODipine 10 MG tablet  Commonly known as:  NORVASC  Take 10 mg by mouth daily.     aspirin EC 81 MG tablet  Take 81 mg by mouth daily.     atorvastatin 20 MG tablet  Commonly known as:  LIPITOR  Take 20 mg by mouth daily.     BAYER CONTOUR NEXT TEST test strip  Generic drug:  glucose blood  TEST TWO TIMES DAILY     glimepiride 2 MG tablet  Commonly known as:  AMARYL  TAKE 1 TABLET BY MOUTH DAILY WITH FIRST MEAL     losartan 100 MG tablet  Commonly known as:  COZAAR  Take 100 mg by mouth daily.     metFORMIN 1000 MG tablet  Commonly known as:  GLUCOPHAGE  Take 1,000 mg by mouth 2 (two) times daily with a meal.     MUSE 250 MCG pellet  Generic drug:  alprostadil  1 SUPPOSITORY INTO URETHRA INTRAURETHRALLY BEFORE SEXUAL INTERCOURSE     alprostadil 1000 MCG pellet  Commonly known as:  MUSE  1 each (1,000 mcg total) by Transurethral route as needed for erectile dysfunction. use no more than 3 times per week     ondansetron 4 MG tablet  Commonly known as:  ZOFRAN  Take 1 tablet (4 mg total) by mouth every 8 (eight) hours as needed for nausea or vomiting.     TOUJEO SOLOSTAR 300 UNIT/ML Sopn  Generic drug:  Insulin Glargine  Inject 65 Units into the skin every evening.        Allergies: No  Known Allergies  Family History: Family History  Problem Relation Age of Onset  . Kidney disease Neg Hx   . Prostate cancer Neg Hx   . Diabetes Mother     Social History:  reports that he has never smoked. He does not have any smokeless tobacco history on file. He reports that he drinks about 12.6 oz of alcohol per week. He reports that he does not use illicit drugs.  ROS: UROLOGY Frequent Urination?: No Hard to postpone urination?: No Burning/pain with urination?: No Get up at night to urinate?: Yes Leakage of urine?: No Urine stream starts and stops?: No Trouble starting stream?: No Do you have to strain to urinate?: No Blood in urine?: No Urinary tract infection?: No Sexually transmitted disease?: No Injury to kidneys or bladder?: No Painful intercourse?: No Weak stream?: No Erection problems?: Yes Penile pain?: No  Gastrointestinal Nausea?: No Vomiting?: No Indigestion/heartburn?: No Diarrhea?: No Constipation?: No  Constitutional Fever: No Night sweats?: No Weight loss?: No Fatigue?: No  Skin Skin rash/lesions?: No Itching?: No  Eyes Blurred vision?: No Double vision?: No  Ears/Nose/Throat Sore throat?: No Sinus problems?: No  Hematologic/Lymphatic Swollen glands?: No Easy bruising?: No  Cardiovascular Leg swelling?: Yes Chest pain?: No  Respiratory Cough?: No Shortness of breath?: No  Endocrine Excessive thirst?: No  Musculoskeletal Back pain?: No Joint pain?: Yes  Neurological Headaches?: Yes Dizziness?: No  Psychologic Depression?: No Anxiety?: No  Physical Exam: BP 184/78 mmHg  Pulse 97  Ht 6\' 1"  (1.854 m)  Wt 258 lb (117.028 kg)  BMI 34.05 kg/m2  Constitutional:  Alert and oriented, No acute distress. HEENT: [place] AT, moist mucus membranes.  Trachea midline, no masses. Cardiovascular: No clubbing, cyanosis, or edema.  RRR. Respiratory: Normal respiratory effort, no increased work of breathing.  CTAB. GU: See previous  exam Neurologic: Grossly intact, no focal deficits, moving all 4 extremities. Psychiatric: Normal mood and affect.  Laboratory Data: Lab Results  Component Value Date   WBC 7.6 01/24/2016   HGB 14.0 01/24/2016   HCT 38.9* 01/24/2016   MCV 89.5 01/24/2016   PLT 226 01/24/2016    Lab Results  Component Value Date   CREATININE 0.78 01/24/2016    Urinalysis Results for orders placed or performed in visit on 04/02/16  AFP tumor marker  Result Value Ref Range   AFP-Tumor Marker 2.2 0.0 - 8.3 ng/mL  Lactate dehydrogenase  Result Value Ref Range   LDH 203 121 - 224 IU/L  Beta HCG, Quant  Result Value Ref Range   hCG Quant <1 0 - 3 mIU/mL    Pertinent Imaging:    Study Result     CLINICAL DATA: The patient has a history of a right undescended testicle which was surgically corrected at the age of 54. Four weeks of pain.  EXAM: SCROTAL ULTRASOUND  DOPPLER ULTRASOUND OF THE TESTICLES  TECHNIQUE: Complete ultrasound examination of the testicles, epididymis, and other scrotal structures was performed. Color and spectral Doppler ultrasound were also utilized to evaluate blood flow to the testicles.  COMPARISON: None.  FINDINGS: Right testicle  Measurements: 3.7 x 2.8 x 3.3 cm. There is a heterogeneous mass within the right testicle measuring 2.5 x 2.1 x 2.5 cm, containing microcalcifications.  Left testicle  Measurements: 3.5 x 1.7 x 2.6 cm. No mass or microlithiasis visualized.  Right epididymis: Normal in size and appearance.  Left epididymis: Normal in size and appearance.  Hydrocele: There is a tiny complex hydrocele on the left.  Varicocele: There is a tiny varicocele on the left.  Pulsed Doppler interrogation of both testes demonstrates normal low resistance arterial and venous waveforms bilaterally.  IMPRESSION: 1. There is a right testicular mass containing microcalcifications, most consistent with malignancy. 2. Tiny left  hydrocele and varicocele. 3. No other abnormalities. These results will be called to the ordering clinician or representative by the Radiologist Assistant, and communication documented in the PACS or zVision Dashboard.   Electronically Signed  By: Dorise Bullion III M.D  On: 04/01/2016 09:05   Study Result     CLINICAL DATA: Kidney stones.  EXAM: ABDOMEN - 1 VIEW  COMPARISON: 10/13/2007.  FINDINGS: Left nephrolithiasis again noted. No evidence of urolithiasis. No bowel distention or free air. No acute bony abnormality .  IMPRESSION: 1. Left nephrolithiasis.  2. No acute abnormality otherwise noted. No bowel distention.   Electronically Signed  By: Marcello Moores Register  On: 04/01/2016 08:48      Assessment & Plan:   1. Testicular pain Right firm intratesticular mass highly concerning for malignancy. He also has a history of right undescended testicle status post orchidopexy at age 51 which increases his risk for malignancy.   Scrotal ultrasound confirms this finding.  I have recommended right inguinal radical orchiectomy.  We discussed the preoperative intraoperative and postoperative details. The risks of the procedure were discussed in detail as well. Further workup with tumor markers, staging CT scan, were also discussed.  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 History of kidney stones History of kidney stones requiring intervention.  KUB with stable left nonobstructing stones. Recommend continued observation given lack of change in 10 years.  4. Vasectomy counseling While in the operating room, the patient is interested in undergoing a left vasectomy. He desires no further children. We had a lengthy discussion today discussing the risk and benefits of this. Postoperative complications including hematoma, bleeding, chronic testicular pain, edema, and vasectomy failure were  discussed. All of his questions were answered. He understands that this is considered an irreversible procedure and need for alternative forms of birth control until the postvasectomy semen analysis proves there are no active sperm remaining.   [name], MD  [facility] [address], [place], [place] [address] [phone]  I spent 30 min with this patient of which greater than 50% was spent in  counseling and coordination of care with the patient.

## 2016-04-05 ENCOUNTER — Telehealth: Payer: Self-pay | Admitting: Radiology

## 2016-04-05 NOTE — Telephone Encounter (Signed)
Notified pt of surgery scheduled 04/08/16, pre-admit testing appt on 04/06/16 @9 :00 and to call day prior to surgery for arrival time to [facility]. Asked pt name of Dr that prescribes his ASA 81mg . Pt states he started taking it on his own & stopped medication on 04/04/16. Advised pt to continue to hold medication until after surgery. Pt voices understanding.

## 2016-04-06 ENCOUNTER — Encounter
Admission: RE | Admit: 2016-04-06 | Discharge: 2016-04-06 | Disposition: A | Discharge status: Home / Self Care | Payer: BLUE CROSS/BLUE SHIELD | Source: Ambulatory Visit | Attending: Urology | Admitting: Urology

## 2016-04-06 DIAGNOSIS — Z9889 Other specified postprocedural states: Secondary | ICD-10-CM | POA: Diagnosis not present

## 2016-04-06 DIAGNOSIS — C6291 Malignant neoplasm of right testis, unspecified whether descended or undescended: Secondary | ICD-10-CM | POA: Diagnosis not present

## 2016-04-06 DIAGNOSIS — Z79899 Other long term (current) drug therapy: Secondary | ICD-10-CM | POA: Diagnosis not present

## 2016-04-06 DIAGNOSIS — Z87442 Personal history of urinary calculi: Secondary | ICD-10-CM | POA: Diagnosis not present

## 2016-04-06 DIAGNOSIS — Z7982 Long term (current) use of aspirin: Secondary | ICD-10-CM | POA: Diagnosis not present

## 2016-04-06 DIAGNOSIS — D4011 Neoplasm of uncertain behavior of right testis: Secondary | ICD-10-CM | POA: Diagnosis present

## 2016-04-06 DIAGNOSIS — G4730 Sleep apnea, unspecified: Secondary | ICD-10-CM | POA: Diagnosis not present

## 2016-04-06 DIAGNOSIS — E119 Type 2 diabetes mellitus without complications: Secondary | ICD-10-CM | POA: Diagnosis not present

## 2016-04-06 DIAGNOSIS — Z833 Family history of diabetes mellitus: Secondary | ICD-10-CM | POA: Diagnosis not present

## 2016-04-06 DIAGNOSIS — Z794 Long term (current) use of insulin: Secondary | ICD-10-CM | POA: Diagnosis not present

## 2016-04-06 DIAGNOSIS — E785 Hyperlipidemia, unspecified: Secondary | ICD-10-CM | POA: Diagnosis not present

## 2016-04-06 DIAGNOSIS — F419 Anxiety disorder, unspecified: Secondary | ICD-10-CM | POA: Diagnosis not present

## 2016-04-06 DIAGNOSIS — I10 Essential (primary) hypertension: Secondary | ICD-10-CM | POA: Diagnosis not present

## 2016-04-06 LAB — CBC
HCT: 39.5 % — ABNORMAL LOW (ref 40.0–52.0)
HEMOGLOBIN: 13.7 g/dL (ref 13.0–18.0)
MCH: 31.8 pg (ref 26.0–34.0)
MCHC: 34.8 g/dL (ref 32.0–36.0)
MCV: 91.5 fL (ref 80.0–100.0)
PLATELETS: 232 10*3/uL (ref 150–440)
RBC: 4.32 MIL/uL — ABNORMAL LOW (ref 4.40–5.90)
RDW: 12.6 % (ref 11.5–14.5)
WBC: 6.8 10*3/uL (ref 3.8–10.6)

## 2016-04-06 LAB — BASIC METABOLIC PANEL
ANION GAP: 12 (ref 5–15)
BUN: 9 mg/dL (ref 6–20)
CALCIUM: 9.1 mg/dL (ref 8.9–10.3)
CO2: 23 mmol/L (ref 22–32)
Chloride: 100 mmol/L — ABNORMAL LOW (ref 101–111)
Creatinine, Ser: 0.91 mg/dL (ref 0.61–1.24)
GFR calc Af Amer: >60 mL/min (ref >60)
GLUCOSE: 306 mg/dL — AB (ref 65–99)
Potassium: 4.2 mmol/L (ref 3.5–5.1)
Sodium: 135 mmol/L (ref 135–145)

## 2016-04-06 NOTE — Patient Instructions (Addendum)
  Your procedure is scheduled on: 5/11 (Thurs.) Report to Day Surgery To find out your arrival time please call [phone] between 1PM - 3PM on 5/10 Wed.  Remember: Instructions that are not followed completely may result in serious medical risk, up to and including death, or upon the discretion of your surgeon and anesthesiologist your surgery may need to be rescheduled.    _x___ 1. Do not eat food or drink liquids after midnight. No gum chewing or hard candies.     _x___ 2. No Alcohol for 24 hours before or after surgery.   ____ 3. Bring all medications with you on the day of surgery if instructed.    __x__ 4. Notify your doctor if there is any change in your medical condition     (cold, fever, infections).     Do not wear jewelry, make-up, hairpins, clips or nail polish.  Do not wear lotions, powders, or perfumes. You may wear deodorant.  Do not shave 48 hours prior to surgery. Men may shave face and neck.  Do not bring valuables to the hospital.    [facility] is not responsible for any belongings or valuables.               Contacts, dentures or bridgework may not be worn into surgery.  Leave your suitcase in the car. After surgery it may be brought to your room.  For patients admitted to the hospital, discharge time is determined by your                treatment team.   Patients discharged the day of surgery will not be allowed to drive home.   Please read over the following fact sheets that you were given:   Surgical Site Infection Prevention   ____ Take these medicines the morning of surgery with A SIP OF WATER:    1. Alprazolam if needed  2.   3.   4.  5.  6.  ____ Fleet Enema (as directed)   __x__ Use CHG Soap as directed  ____ Use inhalers on the day of surgery  __x__ Stop metformin 2 days prior to surgery today    __x__ Take 1/2 of usual insulin dose the night before surgery and none on the morning of surgery.   __x_ Stop aspirin on 5/8  __x__ Stop  Anti-inflammatories on tylenol only for pain   ____ Stop supplements until after surgery.    ____ Bring C-Pap to the hospital.

## 2016-04-06 NOTE — Pre-Procedure Instructions (Signed)
Dr. Marcello Moores consulted about glucose of 306.  Will assess FBS on day of surgery and treat with fast acting insulin as needed

## 2016-04-07 ENCOUNTER — Ambulatory Visit: Payer: BLUE CROSS/BLUE SHIELD

## 2016-04-08 ENCOUNTER — Encounter
Admission: RE | Disposition: A | Discharge status: Home / Self Care | Payer: Self-pay | Source: Ambulatory Visit | Attending: Urology

## 2016-04-08 ENCOUNTER — Ambulatory Visit: Payer: BLUE CROSS/BLUE SHIELD | Admitting: Certified Registered"

## 2016-04-08 ENCOUNTER — Ambulatory Visit
Admission: RE | Admit: 2016-04-08 | Discharge: 2016-04-08 | Disposition: A | Discharge status: Home / Self Care | Payer: BLUE CROSS/BLUE SHIELD | Source: Ambulatory Visit | Attending: Urology | Admitting: Urology

## 2016-04-08 ENCOUNTER — Ambulatory Visit: Payer: Self-pay | Admitting: Urology

## 2016-04-08 ENCOUNTER — Encounter: Payer: Self-pay | Admitting: *Deleted

## 2016-04-08 DIAGNOSIS — E785 Hyperlipidemia, unspecified: Secondary | ICD-10-CM | POA: Insufficient documentation

## 2016-04-08 DIAGNOSIS — C6291 Malignant neoplasm of right testis, unspecified whether descended or undescended: Secondary | ICD-10-CM | POA: Diagnosis not present

## 2016-04-08 DIAGNOSIS — C6290 Malignant neoplasm of unspecified testis, unspecified whether descended or undescended: Secondary | ICD-10-CM

## 2016-04-08 DIAGNOSIS — E119 Type 2 diabetes mellitus without complications: Secondary | ICD-10-CM | POA: Insufficient documentation

## 2016-04-08 DIAGNOSIS — Z833 Family history of diabetes mellitus: Secondary | ICD-10-CM | POA: Insufficient documentation

## 2016-04-08 DIAGNOSIS — Z79899 Other long term (current) drug therapy: Secondary | ICD-10-CM | POA: Insufficient documentation

## 2016-04-08 DIAGNOSIS — Z87442 Personal history of urinary calculi: Secondary | ICD-10-CM | POA: Insufficient documentation

## 2016-04-08 DIAGNOSIS — I10 Essential (primary) hypertension: Secondary | ICD-10-CM | POA: Insufficient documentation

## 2016-04-08 DIAGNOSIS — D4959 Neoplasm of unspecified behavior of other genitourinary organ: Secondary | ICD-10-CM

## 2016-04-08 DIAGNOSIS — Z302 Encounter for sterilization: Secondary | ICD-10-CM | POA: Diagnosis not present

## 2016-04-08 DIAGNOSIS — Z794 Long term (current) use of insulin: Secondary | ICD-10-CM | POA: Insufficient documentation

## 2016-04-08 DIAGNOSIS — D4011 Neoplasm of uncertain behavior of right testis: Secondary | ICD-10-CM | POA: Diagnosis not present

## 2016-04-08 DIAGNOSIS — Z7982 Long term (current) use of aspirin: Secondary | ICD-10-CM | POA: Insufficient documentation

## 2016-04-08 DIAGNOSIS — F419 Anxiety disorder, unspecified: Secondary | ICD-10-CM | POA: Insufficient documentation

## 2016-04-08 DIAGNOSIS — G4730 Sleep apnea, unspecified: Secondary | ICD-10-CM | POA: Insufficient documentation

## 2016-04-08 DIAGNOSIS — Z9889 Other specified postprocedural states: Secondary | ICD-10-CM | POA: Insufficient documentation

## 2016-04-08 HISTORY — DX: Malignant neoplasm of unspecified testis, unspecified whether descended or undescended: C62.90

## 2016-04-08 HISTORY — PX: VASECTOMY: SHX75

## 2016-04-08 HISTORY — PX: ORCHIECTOMY: SHX2116

## 2016-04-08 LAB — GLUCOSE, CAPILLARY
Glucose-Capillary: 218 mg/dL — ABNORMAL HIGH (ref 65–99)
Glucose-Capillary: 213 mg/dL — ABNORMAL HIGH (ref 65–99)

## 2016-04-08 SURGERY — ORCHIECTOMY
Anesthesia: General | Site: Scrotum | Laterality: Right | Wound class: Clean

## 2016-04-08 MED ORDER — BUPIVACAINE HCL 0.5 % IJ SOLN
INTRAMUSCULAR | Status: DC | PRN
  Administered 2016-04-08: 20 mL

## 2016-04-08 MED ORDER — CEFAZOLIN SODIUM-DEXTROSE 2-4 GM/100ML-% IV SOLN
2.0000 g | Freq: Once | INTRAVENOUS | Status: AC
  Administered 2016-04-08: 2 g via INTRAVENOUS

## 2016-04-08 MED ORDER — FENTANYL CITRATE (PF) 100 MCG/2ML IJ SOLN
INTRAMUSCULAR | Status: AC
  Filled 2016-04-08: qty 2

## 2016-04-08 MED ORDER — PROPOFOL 10 MG/ML IV BOLUS
INTRAVENOUS | Status: DC | PRN
Start: 2016-04-08 — End: 2016-04-08
  Administered 2016-04-08: 200 mg via INTRAVENOUS
  Administered 2016-04-08: 100 mg via INTRAVENOUS

## 2016-04-08 MED ORDER — BUPIVACAINE HCL (PF) 0.25 % IJ SOLN
INTRAMUSCULAR | Status: AC
  Filled 2016-04-08: qty 30

## 2016-04-08 MED ORDER — DOCUSATE SODIUM 100 MG PO CAPS: 100.0000 mg | ORAL_CAPSULE | Freq: Two times a day (BID) | ORAL | Status: DC

## 2016-04-08 MED ORDER — FAMOTIDINE 20 MG PO TABS
20.0000 mg | ORAL_TABLET | Freq: Once | ORAL | Status: AC
Start: 2016-04-08 — End: 2016-04-08
  Administered 2016-04-08: 20 mg via ORAL

## 2016-04-08 MED ORDER — SODIUM CHLORIDE 0.9 % IV SOLN
INTRAVENOUS | Status: DC
Start: 2016-04-08 — End: 2016-04-08
  Administered 2016-04-08: 08:00:00 via INTRAVENOUS

## 2016-04-08 MED ORDER — FENTANYL CITRATE (PF) 100 MCG/2ML IJ SOLN
INTRAMUSCULAR | Status: DC | PRN
  Administered 2016-04-08 (×2): 50 ug via INTRAVENOUS

## 2016-04-08 MED ORDER — MIDAZOLAM HCL 2 MG/2ML IJ SOLN
INTRAMUSCULAR | Status: DC | PRN
  Administered 2016-04-08: 2 mg via INTRAVENOUS

## 2016-04-08 MED ORDER — PHENYLEPHRINE HCL 10 MG/ML IJ SOLN
INTRAMUSCULAR | Status: DC | PRN
  Administered 2016-04-08 (×5): 100 ug via INTRAVENOUS

## 2016-04-08 MED ORDER — LIDOCAINE HCL (CARDIAC) 20 MG/ML IV SOLN
INTRAVENOUS | Status: DC | PRN
  Administered 2016-04-08: 100 mg via INTRAVENOUS

## 2016-04-08 MED ORDER — ONDANSETRON HCL 4 MG/2ML IJ SOLN
INTRAMUSCULAR | Status: DC | PRN
  Administered 2016-04-08: 4 mg via INTRAVENOUS

## 2016-04-08 MED ORDER — OXYCODONE HCL 5 MG PO TABS
ORAL_TABLET | ORAL | Status: AC
  Filled 2016-04-08: qty 1

## 2016-04-08 MED ORDER — OXYCODONE HCL 5 MG PO TABS
5.0000 mg | ORAL_TABLET | Freq: Once | ORAL | Status: AC | PRN
  Administered 2016-04-08: 5 mg via ORAL

## 2016-04-08 MED ORDER — FENTANYL CITRATE (PF) 100 MCG/2ML IJ SOLN
25.0000 ug | INTRAMUSCULAR | Status: AC | PRN
  Administered 2016-04-08 (×6): 25 ug via INTRAVENOUS

## 2016-04-08 MED ORDER — OXYCODONE HCL 5 MG/5ML PO SOLN: 5.0000 mg | Freq: Once | ORAL | Status: AC | PRN

## 2016-04-08 MED ORDER — HYDROCODONE-ACETAMINOPHEN 5-325 MG PO TABS: 1.0000 | ORAL_TABLET | Freq: Four times a day (QID) | ORAL | Status: DC | PRN

## 2016-04-08 SURGICAL SUPPLY — 61 items
APPLIER CLIP 55.6 M/L HEMOLOK (INSTRUMENTS) ×4 IMPLANT
BLADE CLIPPER SURG (BLADE) ×4 IMPLANT
BLADE SURG 15 STRL LF DISP TIS (BLADE) ×2 IMPLANT
BLADE SURG 15 STRL SS (BLADE) ×4
CANISTER SUCT 1200ML W/VALVE (MISCELLANEOUS) ×4 IMPLANT
CHLORAPREP W/TINT 26ML (MISCELLANEOUS) ×4 IMPLANT
CLIP TI MEDIUM 6 (CLIP) ×4 IMPLANT
CLOSURE WOUND 1/2 X4 (GAUZE/BANDAGES/DRESSINGS) ×1
CNTNR SPEC 2.5X3XGRAD LEK (MISCELLANEOUS) ×2
CONT SPEC 4OZ STER OR WHT (MISCELLANEOUS) ×2
CONT SPEC 4OZ STRL OR WHT (MISCELLANEOUS) ×2
CONTAINER SPEC 2.5X3XGRAD LEK (MISCELLANEOUS) ×2 IMPLANT
DRAIN PENROSE 5/8X12 LTX STRL (DRAIN) ×4 IMPLANT
DRAPE LAPAROTOMY 77X122 PED (DRAPES) ×4 IMPLANT
DRESSING TELFA 4X3 1S ST N-ADH (GAUZE/BANDAGES/DRESSINGS) ×4 IMPLANT
DRSG TEGADERM 4X4.75 (GAUZE/BANDAGES/DRESSINGS) ×4 IMPLANT
ELECT CAUTERY NEEDLE TIP 1.0 (MISCELLANEOUS) ×4
ELECT REM PT RETURN 9FT ADLT (ELECTROSURGICAL) ×4
ELECTRODE CAUTERY NEDL TIP 1.0 (MISCELLANEOUS) ×2 IMPLANT
ELECTRODE REM PT RTRN 9FT ADLT (ELECTROSURGICAL) ×2 IMPLANT
GAUZE FLUFF 18X24 1PLY STRL (GAUZE/BANDAGES/DRESSINGS) ×4 IMPLANT
GAUZE SPONGE 4X4 12PLY STRL (GAUZE/BANDAGES/DRESSINGS) ×4 IMPLANT
GLOVE BIO SURGEON STRL SZ 6.5 (GLOVE) ×3 IMPLANT
GLOVE BIO SURGEONS STRL SZ 6.5 (GLOVE) ×1
GLOVE BIOGEL PI IND STRL 6.5 (GLOVE) ×2 IMPLANT
GLOVE BIOGEL PI INDICATOR 6.5 (GLOVE) ×2
GOWN STRL REUS W/ TWL LRG LVL3 (GOWN DISPOSABLE) ×4 IMPLANT
GOWN STRL REUS W/TWL LRG LVL3 (GOWN DISPOSABLE) ×8
KIT RM TURNOVER STRD PROC AR (KITS) ×4 IMPLANT
LABEL OR SOLS (LABEL) ×4 IMPLANT
LIQUID BAND (GAUZE/BANDAGES/DRESSINGS) ×4 IMPLANT
NDL FILTER BLUNT 18X1 1/2 (NEEDLE) ×2 IMPLANT
NDL HYPO 25X1 1.5 SAFETY (NEEDLE) ×2 IMPLANT
NDL SAFETY 22GX1.5 (NEEDLE) ×4 IMPLANT
NEEDLE FILTER BLUNT 18X 1/2SAF (NEEDLE) ×2
NEEDLE FILTER BLUNT 18X1 1/2 (NEEDLE) ×2 IMPLANT
NEEDLE HYPO 25X1 1.5 SAFETY (NEEDLE) ×4 IMPLANT
NS IRRIG 500ML POUR BTL (IV SOLUTION) ×4 IMPLANT
PACK BASIN MINOR ARMC (MISCELLANEOUS) ×4 IMPLANT
PREP PVP WINGED SPONGE (MISCELLANEOUS) ×4 IMPLANT
SPONGE KITTNER 5P (MISCELLANEOUS) ×4 IMPLANT
STRIP CLOSURE SKIN 1/2X4 (GAUZE/BANDAGES/DRESSINGS) ×3 IMPLANT
SUCTION FRAZIER HANDLE 10FR (MISCELLANEOUS) ×2
SUCTION TUBE FRAZIER 10FR DISP (MISCELLANEOUS) ×2 IMPLANT
SUPPORETR ATHLETIC LG (MISCELLANEOUS) ×2 IMPLANT
SUPPORTER ATHLETIC LG (MISCELLANEOUS) ×4
SUT CHROMIC 3 0 PS 2 (SUTURE) ×4 IMPLANT
SUT CHROMIC 4 0 RB 1X27 (SUTURE) ×4 IMPLANT
SUT CHROMIC GUT BROWN 0 54 (SUTURE) ×2 IMPLANT
SUT CHROMIC GUT BROWN 0 54IN (SUTURE) ×12
SUT MNCRL 4-0 (SUTURE) ×4
SUT MNCRL 4-0 27XMFL (SUTURE) ×2
SUT SILK 0 SH 30 (SUTURE) ×10 IMPLANT
SUT SILK 2 0 SH (SUTURE) ×16 IMPLANT
SUT VIC AB 3-0 SH 27 (SUTURE) ×4
SUT VIC AB 3-0 SH 27X BRD (SUTURE) ×2 IMPLANT
SUT VIC AB 4-0 FS2 27 (SUTURE) ×4 IMPLANT
SUTURE MNCRL 4-0 27XMF (SUTURE) ×2 IMPLANT
SYR 20CC LL (SYRINGE) ×4 IMPLANT
SYRINGE 10CC LL (SYRINGE) ×4 IMPLANT
WATER STERILE IRR 1000ML POUR (IV SOLUTION) ×4 IMPLANT

## 2016-04-08 NOTE — Discharge Instructions (Signed)
Vasectomy                                    Patient Education and Post Care   If you were provided a sedative (Valium) you must have someone available to drive you home after your procedure. Avoid strenuous activity for 48 hours after your procedure. This includes any heavy lifting. You may return to work the next day, as long as no strenuous activity is required. Leave bandage in place for the next 24hrs. If you have any difficulty urinating remove you may remove bandage earlier. Then keep area clean and dry. You may shower after 24 hours. Do not take a bath or use a hot tub for five (5) days. You may apply ice or a cold pack to the scrotum as needed, for 10 minutes of every hours. (A bag of frozen peas will mold to the area). This may help reduce any pain or swelling. It may also be helpful to wear supportive underwear, such as briefs. Expect some mild pain, mild swelling of the scrotum and possible slight fluid leak from the site of the puncture. A gauze pad may be applied to the scrotum if there is any leakage from the puncture site. This drainage may continue for several days and is normal. You may continue your normal diet. After the procedure, you will be given 1-2 specimen cups. You need to do sperm checks at 6 and/or 12 weeks. Please bring the specimen back to our office to be sent out for analysis. You may resume having sex 1 week after procedure, if comfortable enough. Until you are told that you are sterile, it is essential that you use another form of birth control until otherwise notified by our office. Take Extra-Strength Tylenol, Motrin or Advil as needed for any pain or discomfort. Follow the package directions regarding dose. Stronger prescription pain medication is provided, but most people do not find that it is necessary. If you experience unusual or severe pain that is not relieved by pain medication, excessive bleeding  or drainage, excessive swelling or redness, foul odor or a fever over 101 F, please contact our office.  [facility] [address], [place], [place] [address] [phone]     Orchiectomy, Care After Refer to this sheet in the next few weeks. These instructions provide you with information on caring for yourself after your procedure. Your health care provider may also give you more specific instructions. Your treatment has been planned according to current medical practices, but problems sometimes occur. Call your health care provider if you have any problems or questions after your procedure. WHAT TO EXPECT AFTER THE PROCEDURE A sterile dressing will be applied to the incision site. You may have a scrotal support. This elevates the scrotum, thereby relieving pressure on the surgical site. In those cases where the scrotal support irritates the incision site, you may be better with the support removed. It is okay if the dressing comes off, especially at night. Air will help a scab to form, which will eliminate the need for dressings  during the day. HOME CARE INSTRUCTIONS  Your sterile dressing may be changed once per day or as instructed by your health care provider. If the dressing sticks to your incision site, you may use warm, soapy water or hydrogen peroxide to dampen the bandage. This will loosen the bandage from your skin so that it may be removed.  You may take showers the day after your procedure. Let the water pass gently over the surgery site. Do not rub the site. Pat the area gently or allow to air dry.  Avoid activities that may cause your incision to open.  Do not engage in sexual activity until the area is healed. Usually this will be in about 10-14 days.  Only take over-the-counter or prescription medicines for pain, discomfort, or fever as directed by your health care provider. SEEK MEDICAL CARE IF:  You experience increasing pain. SEEK  IMMEDIATE MEDICAL CARE IF:  You have persistent dizziness or feel sick to your stomach (nausea).  You have difficulty staying awake or are unable to wake from sleeping.  You have difficulty breathing or a congested cough.  You have a fever or shaking chills.  You have increasing pain or tenderness at the incision site.  You notice pus coming from the incision.  You notice a bad smell coming from the incision or dressing.  You cannot eat or drink or you develop nausea or vomiting.  You have constipation that is not helped by adjusting your diet or increasing fluid intake. Pain medicines are a common cause of constipation.  Your incision breaks open after the sutures have been removed.  You experience pain, swelling, or redness in your genital or groin area.   This information is not intended to replace advice given to you by your health care provider. Make sure you discuss any questions you have with your health care provider.   Document Released: 07/18/2013 Document Revised: 11/20/2013 Document Reviewed: 07/18/2013 Elsevier Interactive Patient Education 2016 [place]   1) The drugs that you were given will stay in your system until tomorrow so for the next 24 hours you should not:  A) Drive an automobile B) Make any legal decisions C) Drink any alcoholic beverage   2) You may resume regular meals tomorrow.  Today it is better to start with liquids and gradually work up to solid foods.  You may eat anything you prefer, but it is better to start with liquids, then soup and crackers, and gradually work up to solid foods.   3) Please notify your doctor immediately if you have any unusual bleeding, trouble breathing, redness and pain at the surgery site, drainage, fever, or pain not relieved by medication.    4) Additional Instructions:        Please contact your physician with any problems or Same Day Surgery  at [phone], Monday through Friday 6 am to 4 pm, or [place] at [facility] number at [phone].

## 2016-04-08 NOTE — Anesthesia Procedure Notes (Signed)
Procedure Name: LMA Insertion Date/Time: 04/08/2016 9:20 AM Performed by: Silvana Newness Pre-anesthesia Checklist: Patient identified, Emergency Drugs available, Suction available, Timeout performed and Patient being monitored Patient Re-evaluated:Patient Re-evaluated prior to inductionOxygen Delivery Method: Circle system utilized Preoxygenation: Pre-oxygenation with 100% oxygen Intubation Type: IV induction Ventilation: Mask ventilation without difficulty LMA: LMA inserted LMA Size: 5.0 Number of attempts: 1 Placement Confirmation: positive ETCO2 and breath sounds checked- equal and bilateral Tube secured with: Tape Dental Injury: Teeth and Oropharynx as per pre-operative assessment

## 2016-04-08 NOTE — Op Note (Signed)
Preoperative diagnosis:  1. Right testicular mass 2. Desires vasectomy  Postoperative diagnosis:  1. Same as above  Procedure: 1. Right radical orchiectomy (inguinal approach) 2. Left vasectomy  Surgeon: [name], MD  Anesthesia: General  Complications: None  Intraoperative findings: Firm 2.5 cm right testicular mass palpable.  EBL: Minimal  Specimens: right testicle, left vas  Drains: None  Indication: This is a 55 y.o. patient with probable right testicular mass which was confirmed on ultrasound highly suspicious for testicular cancer.He is counseled to undergo right radical inguinal orchiectomy. After reviewing the management options for treatment, he elected to proceed with the above surgical procedure(s). We have discussed the potential benefits and risks of the procedure, side effects of the proposed treatment, the likelihood of the patient achieving the goals of the procedure, and any potential problems that might occur during the procedure or recuperation. Informed consent has been obtained. In addition, the patient desires a vasectomy.  Description of procedure:  The patient was taken to the operating room and general anesthesia was induced. The patient was placed in the supine position, shaved, prepped and draped in the usual sterile fashion, and preoperative antibiotics were administered. A preoperative time-out was performed.   Attention was turned to the left vasectomy. The vas was identified and half percent Marcaine was instilled at the skin overlying this lateral scrotal incision. A small incision was made using a 15 blade. The incision was carried down to the subcutaneous tissues using a sharp mosquito. The vas was then isolated and brought up to level of the skin using a ring clamp. The vas was then skeletonized and a penetrating towel clip was placed on either side of the isolated vas. The vas was then incised and a portion was sent off for pathologic  analysis. The abdominal end of the vas was then ligated within the lumen using a needlepoint Bovie. The distal end was also ligated using needlepoint Bovie and and fascial interposition was performed using a 3-0 chromic suture. The vas was then dunked back into the wound. A single interrupted 3-0 chromic suture was placed at the skin level.  At this point in time, an approximately 4 cm right inguinal incision was created transversely overlying his previous scar using a 15 blade. Prior to making the incision, I did do physical exam to confirm that the right testicular was the abnormal testicle. In addition, I did place half percent Marcaine plain at the site of the planned incision. Incision was carried down through the subcutaneous tissues using Bovie electrocautery until the cord structures and abdominal wall were identified.  Unfortunately, due to his previous surgery, there was significant scar tissue making dissection and identification of his natural tissue planes were difficult. The cord was gently lifted using a Babcock and a plane was created underneath the cord by elevating it. A Penrose drain was then wrapped circumferentially around the cord for hemostatic control. The right testicle was then delivered into the field by applying traction on the cord and everting the right hemiscrotum. The gubernaculum/ adhesions was then obliterated using a combination of Bovie electrocautery along with chromic ties. Once this was freed, the cord was dissected out proximally to the external ring. The ilioinguinal nerve was not able to be identified due to his irregular anatomy and scar tissue. The cord was dissected out to the external ring (not to internal ring as typically due to scar tissue/ distorted anatomy) A large Kelly x3  was then placed on the cord at the proximal  most location at the level of the right external ring (cord divided into three packets). Another Claiborne Billings was placed more distally. The cord was then  transected and the testicle was passed off the field as right testicle. The cord stump was ligated using 0-0 silk 2 for each packet.A single tie was left long for later identification as needed.  The wound was then irrigated. The scrotum was everted to ensure that there was no residual bleeding of the gubernaculum. Once hemostasis was adequate, the wound was closed in 2 layers using a 3-0 Vicryl suture in a simple interrupted fashion to close the Scarpa's fascia followed by a 4-0 Monocryl subcuticular suture. The wound was then cleaned and dried. Dermabond was applied to the incision site. The scrotal fluffs and a scrotal support devices were applied. He was then reversed from anesthesia and taken to the PACU in stable condition.  Plan: patient will follow-up in two weeks to review pathology results.  [name], M.D.

## 2016-04-08 NOTE — H&P (View-Only) (Signed)
3:47 PM  04/02/16  Nathan Calderon [birth year] [id number]   Chief Complaint  Patient presents with  . Testicle Pain    scrotal u/s results  . Results    HPI: 55 year old male who presents today for multiple issues including right testicular pain, ED, and history of kidney stones.  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 He also has a history of right inguinal hernia repair (?mesh).   Over the past few months, he's had some right testicular discomfort, especially when lying on the right side at nighttime. He has also noted a mass on that side.  He returns today with follow-up scrotal ultrasound which does show a solid tumor within the right testicle, heterogeneous, measuring 2.5 cm highly concerning for testicular carcinoma.  His left testicle is normal.  Erectile dysfunction Patient is a long history of erectile dysfunction. He is tried PDE 5 inhibitors in the past including Viagra without any effect. Recently, he has been on Muse 250 mg with some subtle fullness of his phallus but no full erection. He's had no associated penile pain. He's also been seen and evaluated by Dr. Risa Grill in the past and on intracavernosal injections with fairly good success. He prefers urthrethral suppositories.  Libido is intact.  ED not discussed today.    History of kidney stones History of nonobstructing kidney stones. He has not had abdominal/renal imaging in quite some time. No recent flank pain. No recent gross hematuria.    KUB today shows stable nonobstructing left-sided stones 2, no greater than 5 mm. Review of previous imaging shows that these 2 calcifications have been stable for approximally 10 years or greater.  PMH: Past Medical History  Diagnosis Date  . Angina     2006, had cardiac cath at the time  . Diabetes mellitus   . Kidney stone     hx of  . Headache(784.0)   . Hypertension   . Hyperlipidemia   . Anxiety     . Sleep apnea     Surgical History: Past Surgical History  Procedure Laterality Date  . Eye surgery      x 2  . Lasik    . Hernia repair      with undecended testicle  . Shoulder surgery      left shoulder  . Pars plana vitrectomy  04/04/2012    Procedure: PARS PLANA VITRECTOMY WITH 25 GAUGE;  Surgeon: [name], MD;  Location: [facility];  Service: Ophthalmology;  Laterality: Left;  with laser    Home Medications:    Medication List       This list is accurate as of: 04/02/16 11:59 PM.  Always use your most recent med list.               ALPRAZolam 0.5 MG tablet  Commonly known as:  XANAX  Take 0.5 mg by mouth every 6 (six) hours as needed. for anxiety     amLODipine 10 MG tablet  Commonly known as:  NORVASC  Take 10 mg by mouth daily.     aspirin EC 81 MG tablet  Take 81 mg by mouth daily.     atorvastatin 20 MG tablet  Commonly known as:  LIPITOR  Take 20 mg by mouth daily.     BAYER CONTOUR NEXT TEST test strip  Generic drug:  glucose blood  TEST TWO TIMES DAILY     glimepiride 2 MG tablet  Commonly known as:  AMARYL  TAKE 1 TABLET BY MOUTH DAILY WITH FIRST MEAL     losartan 100 MG tablet  Commonly known as:  COZAAR  Take 100 mg by mouth daily.     metFORMIN 1000 MG tablet  Commonly known as:  GLUCOPHAGE  Take 1,000 mg by mouth 2 (two) times daily with a meal.     MUSE 250 MCG pellet  Generic drug:  alprostadil  1 SUPPOSITORY INTO URETHRA INTRAURETHRALLY BEFORE SEXUAL INTERCOURSE     alprostadil 1000 MCG pellet  Commonly known as:  MUSE  1 each (1,000 mcg total) by Transurethral route as needed for erectile dysfunction. use no more than 3 times per week     ondansetron 4 MG tablet  Commonly known as:  ZOFRAN  Take 1 tablet (4 mg total) by mouth every 8 (eight) hours as needed for nausea or vomiting.     TOUJEO SOLOSTAR 300 UNIT/ML Sopn  Generic drug:  Insulin Glargine  Inject 65 Units into the skin every evening.        Allergies: No  Known Allergies  Family History: Family History  Problem Relation Age of Onset  . Kidney disease Neg Hx   . Prostate cancer Neg Hx   . Diabetes Mother     Social History:  reports that he has never smoked. He does not have any smokeless tobacco history on file. He reports that he drinks about 12.6 oz of alcohol per week. He reports that he does not use illicit drugs.  ROS: UROLOGY Frequent Urination?: No Hard to postpone urination?: No Burning/pain with urination?: No Get up at night to urinate?: Yes Leakage of urine?: No Urine stream starts and stops?: No Trouble starting stream?: No Do you have to strain to urinate?: No Blood in urine?: No Urinary tract infection?: No Sexually transmitted disease?: No Injury to kidneys or bladder?: No Painful intercourse?: No Weak stream?: No Erection problems?: Yes Penile pain?: No  Gastrointestinal Nausea?: No Vomiting?: No Indigestion/heartburn?: No Diarrhea?: No Constipation?: No  Constitutional Fever: No Night sweats?: No Weight loss?: No Fatigue?: No  Skin Skin rash/lesions?: No Itching?: No  Eyes Blurred vision?: No Double vision?: No  Ears/Nose/Throat Sore throat?: No Sinus problems?: No  Hematologic/Lymphatic Swollen glands?: No Easy bruising?: No  Cardiovascular Leg swelling?: Yes Chest pain?: No  Respiratory Cough?: No Shortness of breath?: No  Endocrine Excessive thirst?: No  Musculoskeletal Back pain?: No Joint pain?: Yes  Neurological Headaches?: Yes Dizziness?: No  Psychologic Depression?: No Anxiety?: No  Physical Exam: BP 184/78 mmHg  Pulse 97  Ht 6\' 1"  (1.854 m)  Wt 258 lb (117.028 kg)  BMI 34.05 kg/m2  Constitutional:  Alert and oriented, No acute distress. HEENT: [place] AT, moist mucus membranes.  Trachea midline, no masses. Cardiovascular: No clubbing, cyanosis, or edema.  RRR. Respiratory: Normal respiratory effort, no increased work of breathing.  CTAB. GU: See previous  exam Neurologic: Grossly intact, no focal deficits, moving all 4 extremities. Psychiatric: Normal mood and affect.  Laboratory Data: Lab Results  Component Value Date   WBC 7.6 01/24/2016   HGB 14.0 01/24/2016   HCT 38.9* 01/24/2016   MCV 89.5 01/24/2016   PLT 226 01/24/2016    Lab Results  Component Value Date   CREATININE 0.78 01/24/2016    Urinalysis Results for orders placed or performed in visit on 04/02/16  AFP tumor marker  Result Value Ref Range   AFP-Tumor Marker 2.2 0.0 - 8.3 ng/mL  Lactate dehydrogenase  Result Value Ref Range   LDH 203 121 - 224 IU/L  Beta HCG, Quant  Result Value Ref Range   hCG Quant <1 0 - 3 mIU/mL    Pertinent Imaging:    Study Result     CLINICAL DATA: The patient has a history of a right undescended testicle which was surgically corrected at the age of 47. Four weeks of pain.  EXAM: SCROTAL ULTRASOUND  DOPPLER ULTRASOUND OF THE TESTICLES  TECHNIQUE: Complete ultrasound examination of the testicles, epididymis, and other scrotal structures was performed. Color and spectral Doppler ultrasound were also utilized to evaluate blood flow to the testicles.  COMPARISON: None.  FINDINGS: Right testicle  Measurements: 3.7 x 2.8 x 3.3 cm. There is a heterogeneous mass within the right testicle measuring 2.5 x 2.1 x 2.5 cm, containing microcalcifications.  Left testicle  Measurements: 3.5 x 1.7 x 2.6 cm. No mass or microlithiasis visualized.  Right epididymis: Normal in size and appearance.  Left epididymis: Normal in size and appearance.  Hydrocele: There is a tiny complex hydrocele on the left.  Varicocele: There is a tiny varicocele on the left.  Pulsed Doppler interrogation of both testes demonstrates normal low resistance arterial and venous waveforms bilaterally.  IMPRESSION: 1. There is a right testicular mass containing microcalcifications, most consistent with malignancy. 2. Tiny left  hydrocele and varicocele. 3. No other abnormalities. These results will be called to the ordering clinician or representative by the Radiologist Assistant, and communication documented in the PACS or zVision Dashboard.   Electronically Signed  By: Dorise Bullion III M.D  On: 04/01/2016 09:05   Study Result     CLINICAL DATA: Kidney stones.  EXAM: ABDOMEN - 1 VIEW  COMPARISON: 10/13/2007.  FINDINGS: Left nephrolithiasis again noted. No evidence of urolithiasis. No bowel distention or free air. No acute bony abnormality .  IMPRESSION: 1. Left nephrolithiasis.  2. No acute abnormality otherwise noted. No bowel distention.   Electronically Signed  By: Marcello Moores Register  On: 04/01/2016 08:48      Assessment & Plan:   1. Testicular pain Right firm intratesticular mass highly concerning for malignancy. He also has a history of right undescended testicle status post orchidopexy at age 38 which increases his risk for malignancy.   Scrotal ultrasound confirms this finding.  I have recommended right inguinal radical orchiectomy.  We discussed the preoperative intraoperative and postoperative details. The risks of the procedure were discussed in detail as well. Further workup with tumor markers, staging CT scan, were also discussed.  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 History of kidney stones History of kidney stones requiring intervention.  KUB with stable left nonobstructing stones. Recommend continued observation given lack of change in 10 years.  4. Vasectomy counseling While in the operating room, the patient is interested in undergoing a left vasectomy. He desires no further children. We had a lengthy discussion today discussing the risk and benefits of this. Postoperative complications including hematoma, bleeding, chronic testicular pain, edema, and vasectomy failure were  discussed. All of his questions were answered. He understands that this is considered an irreversible procedure and need for alternative forms of birth control until the postvasectomy semen analysis proves there are no active sperm remaining.   [name], MD  [facility] [address] [place], [place] [address] [phone]  I spent 30 min with this patient of which greater than 50% was spent in  counseling and coordination of care with the patient.

## 2016-04-08 NOTE — Anesthesia Postprocedure Evaluation (Signed)
Anesthesia Post Note  Patient: Nathan Calderon  Procedure(s) Performed: Procedure(s) (LRB): ORCHIECTOMY/ INGUINAL APPROACH (Right) VASECTOMY (Left)  Patient location during evaluation: PACU Anesthesia Type: General Level of consciousness: awake and alert Pain management: pain level controlled Vital Signs Assessment: post-procedure vital signs reviewed and stable Respiratory status: spontaneous breathing, nonlabored ventilation, respiratory function stable and patient connected to nasal cannula oxygen Cardiovascular status: blood pressure returned to baseline and stable Postop Assessment: no signs of nausea or vomiting Anesthetic complications: no    Last Vitals:  Filed Vitals:   04/08/16 1210 04/08/16 1215  BP: 150/88   Pulse: 78 78  Temp:  36.7 C  Resp: 11 19    Last Pain:  Filed Vitals:   04/08/16 1216  PainSc: 1                  Precious Haws Rondey Fallen

## 2016-04-08 NOTE — Interval H&P Note (Signed)
History and Physical Interval Note:  04/08/2016 9:06 AM  Nathan Calderon  has presented today for surgery, with the diagnosis of RIGHT TESTICULAR TUMOR  The various methods of treatment have been discussed with the patient and family. After consideration of risks, benefits and other options for treatment, the patient has consented to  Procedure(s): ORCHIECTOMY/ INGUINAL APPROACH (Right) VASECTOMY (N/A) as a surgical intervention .  The patient's history has been reviewed, patient examined, no change in status, stable for surgery.  I have reviewed the patient's chart and labs.  Questions were answered to the patient's satisfaction.     [name]

## 2016-04-08 NOTE — Anesthesia Preprocedure Evaluation (Signed)
Anesthesia Evaluation  Patient identified by MRN, date of birth, ID band Patient awake    Reviewed: Allergy & Precautions, H&P , NPO status , Patient's Chart, lab work & pertinent test results  History of Anesthesia Complications Negative for: history of anesthetic complications  Airway Mallampati: III  TM Distance: >3 FB Neck ROM: full    Dental  (+) Poor Dentition   Pulmonary neg shortness of breath, sleep apnea ,    Pulmonary exam normal breath sounds clear to auscultation       Cardiovascular Exercise Tolerance: Good hypertension, (-) angina(-) Past MI and (-) DOE Normal cardiovascular exam Rhythm:regular Rate:Normal     Neuro/Psych  Headaches, PSYCHIATRIC DISORDERS Anxiety    GI/Hepatic negative GI ROS, Neg liver ROS,   Endo/Other  negative endocrine ROSdiabetes, Type 2, Insulin Dependent  Renal/GU Renal disease  negative genitourinary   Musculoskeletal   Abdominal   Peds  Hematology negative hematology ROS (+)   Anesthesia Other Findings Past Medical History:   Angina                                                         Comment:2006, had cardiac cath at the time   Diabetes mellitus                                            Kidney stone                                                   Comment:hx of   Headache(784.0)                                              Hypertension                                                 Hyperlipidemia                                               Anxiety                                                      Sleep apnea                                                 Past Surgical History:   EYE SURGERY  Comment:x 2   LASIK                                                         HERNIA REPAIR                                                   Comment:with undecended testicle   SHOULDER SURGERY                                                 Comment:left shoulder   PARS PLANA VITRECTOMY                            04/04/2012       Comment:Procedure: PARS PLANA VITRECTOMY WITH 25 GAUGE;              Surgeon: [name], MD;  Location: [facility];              Service: Ophthalmology;  Laterality: Left;                with laser  BMI    Body Mass Index   34.04 kg/m 2      Reproductive/Obstetrics negative OB ROS                             Anesthesia Physical Anesthesia Plan  ASA: III  Anesthesia Plan: General LMA   Post-op Pain Management:    Induction:   Airway Management Planned:   Additional Equipment:   Intra-op Plan:   Post-operative Plan:   Informed Consent: I have reviewed the patients History and Physical, chart, labs and discussed the procedure including the risks, benefits and alternatives for the proposed anesthesia with the patient or authorized representative who has indicated his/her understanding and acceptance.   Dental Advisory Given  Plan Discussed with: Anesthesiologist, CRNA and Surgeon  Anesthesia Plan Comments:         Anesthesia Quick Evaluation

## 2016-04-08 NOTE — Transfer of Care (Signed)
Immediate Anesthesia Transfer of Care Note  Patient: Nathan Calderon  Procedure(s) Performed: Procedure(s): ORCHIECTOMY/ INGUINAL APPROACH (Right) VASECTOMY (Left)  Patient Location: PACU  Anesthesia Type:General  Level of Consciousness: awake, alert , oriented and patient cooperative  Airway & Oxygen Therapy: Patient Spontanous Breathing and Patient connected to face mask oxygen  Post-op Assessment: Report given to RN, Post -op Vital signs reviewed and stable and Patient moving all extremities X 4  Post vital signs: Reviewed and stable  Last Vitals:  Filed Vitals:   04/08/16 0809  BP: 161/84  Pulse: 80  Temp: 36.8 C  Resp: 20    Last Pain:  Filed Vitals:   04/08/16 0814  PainSc: 1       Patients Stated Pain Goal: 2 ([id number])  Complications: No apparent anesthesia complications

## 2016-04-12 ENCOUNTER — Other Ambulatory Visit: Payer: Self-pay | Admitting: Urology

## 2016-04-12 LAB — SURGICAL PATHOLOGY

## 2016-04-12 MED ORDER — HYDROCODONE-ACETAMINOPHEN 5-325 MG PO TABS: 1.0000 | ORAL_TABLET | Freq: Four times a day (QID) | ORAL | Status: DC | PRN

## 2016-04-13 ENCOUNTER — Telehealth: Payer: Self-pay | Admitting: Urology

## 2016-04-15 ENCOUNTER — Ambulatory Visit (INDEPENDENT_AMBULATORY_CARE_PROVIDER_SITE_OTHER): Payer: BLUE CROSS/BLUE SHIELD | Admitting: Urology

## 2016-04-15 ENCOUNTER — Ambulatory Visit
Admission: RE | Admit: 2016-04-15 | Discharge: 2016-04-15 | Disposition: A | Discharge status: Home / Self Care | Payer: BLUE CROSS/BLUE SHIELD | Source: Ambulatory Visit | Attending: Urology | Admitting: Urology

## 2016-04-15 ENCOUNTER — Encounter: Payer: Self-pay | Admitting: Urology

## 2016-04-15 VITALS — BP 162/80 | HR 89 | Ht 73.0 in | Wt 250.9 lb

## 2016-04-15 DIAGNOSIS — K760 Fatty (change of) liver, not elsewhere classified: Secondary | ICD-10-CM | POA: Insufficient documentation

## 2016-04-15 DIAGNOSIS — I700 Atherosclerosis of aorta: Secondary | ICD-10-CM | POA: Insufficient documentation

## 2016-04-15 DIAGNOSIS — D4959 Neoplasm of unspecified behavior of other genitourinary organ: Secondary | ICD-10-CM

## 2016-04-15 DIAGNOSIS — N489 Disorder of penis, unspecified: Secondary | ICD-10-CM

## 2016-04-15 DIAGNOSIS — N200 Calculus of kidney: Secondary | ICD-10-CM | POA: Insufficient documentation

## 2016-04-15 DIAGNOSIS — Z9889 Other specified postprocedural states: Secondary | ICD-10-CM

## 2016-04-15 MED ORDER — IOPAMIDOL (ISOVUE-370) INJECTION 76%
100.0000 mL | Freq: Once | INTRAVENOUS | Status: AC | PRN
  Administered 2016-04-15: 100 mL via INTRAVENOUS

## 2016-04-18 NOTE — Progress Notes (Signed)
04/15/2016 5:40 PM   Nathan Calderon [id number] [id number]  Referring provider: No referring provider defined for this encounter.  Chief Complaint  Patient presents with  . Post-op Problem    ORCHIECTOMY/ INGUINAL APPROACH (Right).     HPI: Patient is a 55 year old Caucasian male who presents today requesting an urgent appointment to evaluate an area on the head of his penis.  Patient underwent a right radical orchiectomy, inguinal approach, and a left vasectomy with Dr. Erlene Quan on 04/08/2016. A few days later he noted a sore near the coronal ridge at the 10:00 position on the glans of his penis.  When he first noticed the area, it appeared as a slit. He was wondering if it may have been nicked with a scalpel during surgery.  Over the next few days, he been applying hydrogen peroxide and Neosporin to the area.  Has not increased in size since its initial discovery. It does not have a drainage. It is not tender to touch. He has not had any penile swelling.    No difficulty with urination. He has not had fevers, chills, nausea or vomiting.   PMH: Past Medical History  Diagnosis Date  . Angina     2006, had cardiac cath at the time  . Diabetes mellitus   . Kidney stone     hx of  . Headache(784.0)   . Hypertension   . Hyperlipidemia   . Anxiety   . Sleep apnea     Surgical History: Past Surgical History  Procedure Laterality Date  . Eye surgery      x 2  . Lasik    . Hernia repair      with undecended testicle  . Shoulder surgery      left shoulder  . Pars plana vitrectomy  04/04/2012    Procedure: PARS PLANA VITRECTOMY WITH 25 GAUGE;  Surgeon: [name], MD;  Location: [facility];  Service: Ophthalmology;  Laterality: Left;  with laser  . Orchiectomy Right 04/08/2016    Procedure: ORCHIECTOMY/ INGUINAL APPROACH;  Surgeon: [name], MD;  Location: [facility];  Service: Urology;  Laterality: Right;  . Vasectomy Left 04/08/2016    Procedure: VASECTOMY;  Surgeon:  [name], MD;  Location: [facility];  Service: Urology;  Laterality: Left;    Home Medications:    Medication List       This list is accurate as of: 04/15/16 11:59 PM.  Always use your most recent med list.               ALPRAZolam 0.5 MG tablet  Commonly known as:  XANAX  Take 0.5 mg by mouth every 6 (six) hours as needed. for anxiety     amLODipine 10 MG tablet  Commonly known as:  NORVASC  Take 10 mg by mouth daily.     aspirin EC 81 MG tablet  Take 81 mg by mouth daily.     atorvastatin 20 MG tablet  Commonly known as:  LIPITOR  Take 20 mg by mouth daily.     BAYER CONTOUR NEXT TEST test strip  Generic drug:  glucose blood  TEST TWO TIMES DAILY     docusate sodium 100 MG capsule  Commonly known as:  COLACE  Take 1 capsule (100 mg total) by mouth 2 (two) times daily.     glimepiride 2 MG tablet  Commonly known as:  AMARYL  TAKE 1 TABLET BY MOUTH DAILY WITH FIRST MEAL  HYDROcodone-acetaminophen 5-325 MG tablet  Commonly known as:  NORCO/VICODIN  Take 1-2 tablets by mouth every 6 (six) hours as needed for moderate pain.     losartan 100 MG tablet  Commonly known as:  COZAAR  Take 100 mg by mouth daily.     metFORMIN 1000 MG tablet  Commonly known as:  GLUCOPHAGE  Take 1,000 mg by mouth 2 (two) times daily with a meal.     MUSE 250 MCG pellet  Generic drug:  alprostadil  1 SUPPOSITORY INTO URETHRA INTRAURETHRALLY BEFORE SEXUAL INTERCOURSE     ondansetron 4 MG tablet  Commonly known as:  ZOFRAN  Take 1 tablet (4 mg total) by mouth every 8 (eight) hours as needed for nausea or vomiting.     TOUJEO SOLOSTAR 300 UNIT/ML Sopn  Generic drug:  Insulin Glargine  Inject 65 Units into the skin every evening.        Allergies: No Known Allergies  Family History: Family History  Problem Relation Age of Onset  . Kidney disease Neg Hx   . Prostate cancer Neg Hx   . Diabetes Mother     Social History:  reports that he has never smoked. He has  never used smokeless tobacco. He reports that he drinks alcohol. He reports that he does not use illicit drugs.  ROS: UROLOGY Frequent Urination?: No Hard to postpone urination?: No Burning/pain with urination?: No Get up at night to urinate?: Yes Leakage of urine?: No Urine stream starts and stops?: No Trouble starting stream?: No Do you have to strain to urinate?: No Blood in urine?: No Urinary tract infection?: No Sexually transmitted disease?: No Injury to kidneys or bladder?: No Painful intercourse?: No Weak stream?: No Erection problems?: Yes Penile pain?: Yes (abrasion on the head of the penis )  Gastrointestinal Nausea?: No Vomiting?: No Indigestion/heartburn?: No Diarrhea?: No Constipation?: No  Constitutional Fever: No Night sweats?: No Weight loss?: No Fatigue?: No  Skin Skin rash/lesions?: No Itching?: No  Eyes Blurred vision?: No Double vision?: No  Ears/Nose/Throat Sore throat?: No Sinus problems?: No  Hematologic/Lymphatic Swollen glands?: No Easy bruising?: No  Cardiovascular Leg swelling?: Yes Chest pain?: No  Respiratory Cough?: No Shortness of breath?: No  Endocrine Excessive thirst?: No  Musculoskeletal Back pain?: No Joint pain?: No  Neurological Headaches?: No Dizziness?: No  Psychologic Depression?: No Anxiety?: No  Physical Exam: BP 162/80 mmHg  Pulse 89  Ht 6\' 1"  (1.854 m)  Wt 250 lb 14.4 oz (113.807 kg)  BMI 33.11 kg/m2  Constitutional: Well nourished. Alert and oriented, No acute distress. HEENT: [place] AT, moist mucus membranes. Trachea midline, no masses. Cardiovascular: No clubbing, cyanosis, or edema. Respiratory: Normal respiratory effort, no increased work of breathing. GI: Abdomen is soft, non tender, non distended, no abdominal masses. Liver and spleen not palpable.  No hernias appreciated.  Stool sample for occult testing is not indicated.  Right inguinal incision is clean and dry.  Left vas incision  is clean and dry. GU: No CVA tenderness.  No bladder fullness or masses.  Patient with circumcised phallus.   Urethral meatus is patent.  No penile discharge. A diamond shape wound is noted at the 10:00 position on the glans near the coronal ridge.   It is approximately 3 mm by 1 mm in size.   It is clean and dry. Scrotum without lesions, cysts, rashes and/or edema.  Testicles are located scrotally bilaterally. No masses are appreciated in the testicles. Left and right epididymis are normal.  Skin: No rashes, bruises or suspicious lesions. Lymph: No cervical or inguinal adenopathy. Neurologic: Grossly intact, no focal deficits, moving all 4 extremities. Psychiatric: Normal mood and affect.  Laboratory Data: Lab Results  Component Value Date   WBC 6.8 04/06/2016   HGB 13.7 04/06/2016   HCT 39.5* 04/06/2016   MCV 91.5 04/06/2016   PLT 232 04/06/2016    Lab Results  Component Value Date   CREATININE 0.91 04/06/2016    Lab Results  Component Value Date   AST 27 01/24/2016   Lab Results  Component Value Date   ALT 30 01/24/2016    Assessment & Plan:    1. Penile wound:   Patient reassured that the wound is not infected.  He is advised to discontinue the use of hydrogen peroxide.  He will follow-up as scheduled.  2. S/P right radical orchiectomy and left vasectomy:   Patient is doing well. He will follow up as scheduled.  Return for Keep follow up appointment with Dr. Erlene Quan.  These notes generated with voice recognition software. I apologize for typographical errors.  Zara Council, [facility] [address] [place],  [address] [phone]

## 2016-04-23 ENCOUNTER — Encounter: Payer: Self-pay | Admitting: Urology

## 2016-04-23 ENCOUNTER — Ambulatory Visit (INDEPENDENT_AMBULATORY_CARE_PROVIDER_SITE_OTHER): Payer: BLUE CROSS/BLUE SHIELD | Admitting: Urology

## 2016-04-23 VITALS — BP 158/75 | HR 88 | Ht 71.5 in | Wt 254.1 lb

## 2016-04-23 DIAGNOSIS — C6201 Malignant neoplasm of undescended right testis: Secondary | ICD-10-CM

## 2016-04-23 DIAGNOSIS — N501 Vascular disorders of male genital organs: Secondary | ICD-10-CM

## 2016-04-23 DIAGNOSIS — S3022XA Contusion of scrotum and testes, initial encounter: Secondary | ICD-10-CM

## 2016-04-23 DIAGNOSIS — Z87442 Personal history of urinary calculi: Secondary | ICD-10-CM

## 2016-04-23 NOTE — Progress Notes (Signed)
1:24 PM  04/23/2016   Ozella Rocks 1961/05/04 [id number]   Chief Complaint  Patient presents with  . Routine Post Op    2 week s/p  Orchiectomy and left vasectomy    HPI: 55 year old male who presents today for multiple issues including right testicular cancer, ED, and history of kidney stones.  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 pT2. CT abdomen and pelvis obtained postoperatively shows no obvious metastatic disease. Chest CT is pending. Tumor markers preoperatively negative.    He has developed some fullness in the right inguinal area extending into the upper hemiscrotum. No drainage or redness. Minimal pain and discomfort. He is anxious to get back to normal activities.  Erectile dysfunction Refractory to PDE 5 inhibitors. He previously tried intracavernosal injections with fairly good success. He prefers urthrethral suppositories.  Libido is intact.   ED not discussed today.    History of kidney stones History of nonobstructing kidney stones. He has not had abdominal/renal imaging in quite some time. No recent flank pain. No recent gross hematuria.  KUB 03/2016 stable nonobstructing left-sided stones 2, no greater than 5 mm, stable x 10 years.  PMH: Past Medical History  Diagnosis Date  . Angina     2006, had cardiac cath at the time  . Diabetes mellitus   . Kidney stone     hx of  . Headache(784.0)   . Hypertension   . Hyperlipidemia   . Anxiety   . Sleep apnea     Surgical History: Past Surgical History  Procedure Laterality Date  . Eye surgery      x 2  . Lasik    . Hernia repair      with undecended testicle  . Shoulder surgery      left shoulder  . Pars plana vitrectomy  04/04/2012    Procedure: PARS PLANA VITRECTOMY WITH 25 GAUGE;  Surgeon: [name], MD;  Location: [place];  Service: Ophthalmology;  Laterality: Left;  with laser  . Orchiectomy Right 04/08/2016    Procedure: ORCHIECTOMY/ INGUINAL APPROACH;  Surgeon: [name], MD;  Location: [facility];  Service: Urology;  Laterality: Right;  . Vasectomy Left 04/08/2016    Procedure: VASECTOMY;  Surgeon: [name], MD;  Location: [facility];  Service: Urology;  Laterality: Left;    Home Medications:    Medication List       This list is accurate as of: 04/23/16  1:24 PM.  Always use your most recent med list.               ALPRAZolam 0.5 MG tablet  Commonly known as:  XANAX  Take 0.5 mg by mouth every 6 (six) hours as needed. for anxiety     amLODipine 10 MG tablet  Commonly known as:  NORVASC  Take 10 mg by mouth daily.     aspirin EC 81 MG tablet  Take 81 mg by mouth daily.     atorvastatin 20 MG tablet  Commonly known as:  LIPITOR  Take 20 mg by mouth daily.     BAYER CONTOUR NEXT TEST test strip  Generic drug:  glucose blood  TEST TWO TIMES DAILY     docusate sodium 100 MG capsule  Commonly known as:  COLACE  Take 1 capsule (100 mg total) by mouth 2 (two) times daily.  glimepiride 2 MG tablet  Commonly known as:  AMARYL  TAKE 1 TABLET BY MOUTH DAILY WITH FIRST MEAL     HYDROcodone-acetaminophen 5-325 MG tablet  Commonly known as:  NORCO/VICODIN  Take 1-2 tablets by mouth every 6 (six) hours as needed for moderate pain.     losartan 100 MG tablet  Commonly known as:  COZAAR  Take 100 mg by mouth daily.     metFORMIN 1000 MG tablet  Commonly known as:  GLUCOPHAGE  Take 1,000 mg by mouth 2 (two) times daily with a meal.     MUSE 250 MCG pellet  Generic drug:  alprostadil  Reported on 04/23/2016     ondansetron 4 MG tablet  Commonly known as:  ZOFRAN  Take 1 tablet (4 mg total) by mouth every 8 (eight) hours as needed for nausea or vomiting.     TOUJEO SOLOSTAR 300 UNIT/ML Sopn  Generic drug:  Insulin Glargine  Inject 65 Units into the skin every evening.          Allergies: No Known Allergies  Family History: Family History  Problem Relation Age of Onset  . Kidney disease Neg Hx   . Prostate cancer Neg Hx   . Diabetes Mother     Social History:  reports that he has never smoked. He has never used smokeless tobacco. He reports that he drinks alcohol. He reports that he does not use illicit drugs.  ROS: UROLOGY Frequent Urination?: No Hard to postpone urination?: No Burning/pain with urination?: No Get up at night to urinate?: No Leakage of urine?: No Urine stream starts and stops?: No Trouble starting stream?: No Do you have to strain to urinate?: No Blood in urine?: No Urinary tract infection?: No Sexually transmitted disease?: No Injury to kidneys or bladder?: No Painful intercourse?: No Weak stream?: No Erection problems?: Yes Penile pain?: Yes  Gastrointestinal Nausea?: No Vomiting?: No Indigestion/heartburn?: No Diarrhea?: No Constipation?: No  Constitutional Fever: No Night sweats?: No Weight loss?: No Fatigue?: No  Skin Skin rash/lesions?: No Itching?: No  Eyes Blurred vision?: No Double vision?: No  Ears/Nose/Throat Sore throat?: No Sinus problems?: No  Hematologic/Lymphatic Swollen glands?: No Easy bruising?: No  Cardiovascular Leg swelling?: No Chest pain?: No  Respiratory Cough?: No Shortness of breath?: No  Endocrine Excessive thirst?: No  Musculoskeletal Back pain?: No Joint pain?: No  Neurological Headaches?: No Dizziness?: No  Psychologic Depression?: No Anxiety?: No  Physical Exam: BP 158/75 mmHg  Pulse 88  Ht 5' 11.5" (1.816 m)  Wt 254 lb 1.6 oz (115.259 kg)  BMI 34.95 kg/m2  Constitutional:  Alert and oriented, No acute distress. HEENT: [place] AT, moist mucus membranes.  Trachea midline, no masses. Cardiovascular: No clubbing, cyanosis, or edema.  Respiratory: Normal respiratory effort, no increased work of breathing.   GU: Right subinguinal incision healing well,  Dermabond glue still in place. Palpable subcutaneous edema/hematoma extending into the inguinal canal into the upper right hemiscrotum. Minimal scrotal edema. No erythema, fluctuance, or drainage. Neurologic: Grossly intact, no focal deficits, moving all 4 extremities. Psychiatric: Normal mood and affect.  Laboratory Data: Lab Results  Component Value Date   WBC 6.8 04/06/2016   HGB 13.7 04/06/2016   HCT 39.5* 04/06/2016   MCV 91.5 04/06/2016   PLT 232 04/06/2016    Lab Results  Component Value Date   CREATININE 0.91 04/06/2016    Urinalysis Results for orders placed or performed during the hospital encounter of 04/08/16  Glucose, capillary  Result Value  Ref Range   Glucose-Capillary 218 (H) 65 - 99 mg/dL  Glucose, capillary  Result Value Ref Range   Glucose-Capillary 213 (H) 65 - 99 mg/dL   Comment 1 Notify RN   Surgical pathology  Result Value Ref Range   SURGICAL PATHOLOGY      Surgical Pathology CASE: [id number] PATIENT: Nathan Calderon Surgical Pathology Report     SPECIMEN SUBMITTED: A. Left vas deferens B. Right testicle  CLINICAL HISTORY: Right testicular tumor; normal AFP, beta HCG, and LDH  PRE-OPERATIVE DIAGNOSIS: Right testicular tumor  POST-OPERATIVE DIAGNOSIS: Same as pre-op     DIAGNOSIS: A. VAS DEFERENS, LEFT; EXCISION: - SEGMENT OF VAS DEFERENS WITH FULL SECTION OF THE LUMEN EXAMINED.  B. TESTICULAR TUMOR, RIGHT; ORCHIECTOMY: - EMBRYONAL CARCINOMA. - INTRATUBULAR GERM CELL NEOPLASIA. - SEE CANCER SUMMARY BELOW.   TESTIS: Radical Orchiectomy Testis, Radical Orchiectomy Cancer Case Summary Procedure:     Radical orchiectomy Specimen Site: Testis structure (body structure) Tumor Site:    Testis structure SPECIMEN Specimen Laterality:     Right Tumor Focality:     Unifocal Tumor Size:    Greatest dimension of main tumor mass (cm) 3.1cm TUMOR Histologic Type:    Embryonal carcinoma EXTENT Macroscopic Exte nt of Tumor:  Confined  to the testis Microscopic Tumor Extension:  +Rete testis MARGINS Spermatic Cord Margin:   Uninvolved by tumor Other Margin(s):    Not applicable ACCESSORY FINDINGS Lymph-Vascular Invasion: Present STAGE (pTNM) Primary Tumor (pT): pT2: Tumor limited to the testis and epididymis with vascular / lymphatic invasion, or tumor extending through the tunica albuginea with involvement of the tunica vaginalis Regional Lymph Nodes (pN) pNX: Cannot be assessed No nodes submitted or found Distant Metastasis (pM): Not applicable   GROSS DESCRIPTION:  A. Labeled: left vas deferens  Size: 0.3 x 0.2 x 0.2 cm  Other findings: pink to tan tubular fragment  Block summary: 1-entirely submitted intact for histologic processing  B. Labeled: right testicle  Type of procedure: radical  Laterality: right  Weight of specimen: 52 grams Size of specimen:           Testis: 4.6 x 3.2 x 3.5 cm           Spermatic cord: 5.2 x 4.0 x 1.5 cm           Epididym is: 2.3 x 0.8 x 0.6 cm  Structures attached to testis:. A moderate amount of attached fibrofatty tissue on external surface  Orientation: external surface inked blue  Number of masses: 1  Size(s) of mass(es): 3.1 x 2.6 x 2.2 cm  Description of mass(es): heterogeneous ill-defined firm tan to red with focal calcification  Confinement to testis: grossly confined  Relationship of tumor to adjacent anatomic structures:      Epididymis: grossly uninvolved      Tunica vaginalis: grossly uninvolved      Paratesticular soft tissues: grossly uninvolved      Spermatic cord: grossly uninvolved      Hilar soft tissue: grossly uninvolved      Other: attached fibrofatty tissue grossly uninvolved  Margins: 5.3 cm from spermatic cord margin  Description of remainder of tissue: stringy and tan  Block summary: 1-en face spermatic cord margin 2-mid spermatic cord 3-spermatic cord at entry to testis 4-10-representative sections of mass with  uninvolved parenchyma, epididymis , tunica and attached overlying fibrofatty tissue 11-representative right vas deferens (submitted intact for histologic processing)   Final Diagnosis performed by Quay Burow, MD.  Electronically signed 04/12/2016 10:07:03AM  The electronic signature indicates that the named Attending Pathologist has evaluated the specimen  Technical component performed at [facility], [address], [place], [place] [address] Lab: [phone] Dir: Darrick Penna. Evette Doffing, MD  Professional component performed at [facility], [facility], [place], [place], [place] [address] Lab: [phone] Dir: Dellia Nims. Reuel Derby, MD      Pertinent Imaging: Study Result     CLINICAL DATA: Right-sided radical orchiectomy and left vasectomy 04/08/2016 secondary testicular tumor. Nondistended testicular surgery at 55 years old. Inguinal hernia repair. Hyperlipidemia. Hypertension.  EXAM: CT ABDOMEN AND PELVIS WITH CONTRAST  TECHNIQUE: Multidetector CT imaging of the abdomen and pelvis was performed using the standard protocol following bolus administration of intravenous contrast.  CONTRAST: 100 cc of Isovue 370  COMPARISON: Abdominal ultrasound of 07/14/2006. Plain films of 03/31/2016.  FINDINGS: Lower chest: Clear lung bases. Normal heart size without pericardial or pleural effusion.  Hepatobiliary: Mild hepatic steatosis whole. No focal liver lesion. Normal gallbladder, without biliary ductal dilatation.  Pancreas: Pancreatic fatty replacement in the head and uncinate process. No duct dilatation.  Spleen: Normal in size, without focal abnormality.  Adrenals/Urinary Tract: Normal adrenal glands. Lower pole left renal collecting system stones up to 5 mm. Normal right kidney, without hydronephrosis or hydroureter. Normal urinary bladder.  Stomach/Bowel: Normal stomach, without wall thickening. Underdistended right colon. Normal  appendix, and terminal ileum. Normal small bowel.  Vascular/Lymphatic: Aortic atherosclerosis. No abdominopelvic adenopathy.  Reproductive: Normal prostate. Right orchiectomy with postoperative gas/edema and ill-defined fluid in the inguinal canal and imaged portion of the right hemiscrotum.  Other: No significant free fluid. Small fat containing left inguinal hernia.  Musculoskeletal: Disc bulge at L4-5. Mild loss of intervertebral disc height at L5-S1.  IMPRESSION: 1. Right-sided orchidectomy. No acute process or evidence of metastatic disease in the abdomen or pelvis. 2. Left nephrolithiasis. 3. Advanced atherosclerosis. 4. Hepatic steatosis.   Electronically Signed  By: Abigail Miyamoto M.D.  On: 04/15/2016 17:31      Assessment & Plan:    1. 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 Risks and benefits of each were discussed. Surveillance protocol as well as protocol for surveillance should he elect to undergo an adjuvant treatment were discussed in detail. An NCCN guidelines were reviewed. He was given handouts about his pathology and guidelines.  He is not interested in surgery but would like to hear a little bit more about it. He is more interested in chemotherapy.  May also consider surveillance. He would like to hear more about each of his options. As such, I will refer him to [facility] urology for discussion of RPLND and [facility].    He will return in 2 months.  2. Scrotal hematoma Supportive care, will resolve slowly over time Warning symptoms reviewed  3. History of kidney stones History of kidney stones requiring intervention.  KUB with stable left  nonobstructing stones. Recommend continued observation given lack of change in 10 years.  [name], MD  [facility] [address], [place], [place] [address] [phone]

## 2016-04-25 ENCOUNTER — Encounter: Payer: Self-pay | Admitting: Urology

## 2016-04-27 ENCOUNTER — Telehealth: Payer: Self-pay | Admitting: Urology

## 2016-04-27 NOTE — Telephone Encounter (Signed)
Faxed referral to Ava at [facility] urology [phone]  They will contact the patient with the appointment  Sharyn Lull

## 2016-04-28 NOTE — Telephone Encounter (Signed)
Patient has an appt with Dr. Elyse Hsu on 05-17-16 per Ava at [facility]

## 2016-05-03 ENCOUNTER — Ambulatory Visit
Admission: RE | Admit: 2016-05-03 | Discharge: 2016-05-03 | Disposition: A | Discharge status: Home / Self Care | Payer: BLUE CROSS/BLUE SHIELD | Source: Ambulatory Visit | Attending: Urology | Admitting: Urology

## 2016-05-03 DIAGNOSIS — I2510 Atherosclerotic heart disease of native coronary artery without angina pectoris: Secondary | ICD-10-CM | POA: Diagnosis not present

## 2016-05-03 DIAGNOSIS — C6201 Malignant neoplasm of undescended right testis: Secondary | ICD-10-CM | POA: Insufficient documentation

## 2016-05-03 MED ORDER — IOPAMIDOL (ISOVUE-370) INJECTION 76%
75.0000 mL | Freq: Once | INTRAVENOUS | Status: AC | PRN
  Administered 2016-05-03: 75 mL via INTRAVENOUS

## 2016-05-03 NOTE — Telephone Encounter (Signed)
Note opened in error.

## 2016-05-04 ENCOUNTER — Telehealth: Payer: Self-pay

## 2016-05-04 NOTE — Telephone Encounter (Signed)
LMOM

## 2016-05-04 NOTE — Telephone Encounter (Signed)
-----   Message from [name], MD sent at 05/03/2016  5:09 PM EDT ----- Please let this patient noted his staging CT scan of his chest is negative for any metastatic disease. This is great news.

## 2016-05-04 NOTE — Telephone Encounter (Signed)
Spoke with pt in reference to CT results. Pt voiced understanding.  

## 2016-05-05 ENCOUNTER — Inpatient Hospital Stay: Payer: BLUE CROSS/BLUE SHIELD | Attending: Hematology and Oncology | Admitting: Hematology and Oncology

## 2016-05-05 ENCOUNTER — Encounter: Payer: Self-pay | Admitting: *Deleted

## 2016-05-05 ENCOUNTER — Encounter: Payer: Self-pay | Admitting: Hematology and Oncology

## 2016-05-05 VITALS — BP 167/84 | HR 93 | Temp 98.4°F | Resp 18 | Ht 73.6 in | Wt 260.1 lb

## 2016-05-05 DIAGNOSIS — C6211 Malignant neoplasm of descended right testis: Secondary | ICD-10-CM | POA: Insufficient documentation

## 2016-05-05 DIAGNOSIS — D649 Anemia, unspecified: Secondary | ICD-10-CM | POA: Insufficient documentation

## 2016-05-05 DIAGNOSIS — E119 Type 2 diabetes mellitus without complications: Secondary | ICD-10-CM | POA: Insufficient documentation

## 2016-05-05 DIAGNOSIS — Z79899 Other long term (current) drug therapy: Secondary | ICD-10-CM | POA: Insufficient documentation

## 2016-05-05 DIAGNOSIS — I10 Essential (primary) hypertension: Secondary | ICD-10-CM | POA: Diagnosis not present

## 2016-05-05 DIAGNOSIS — Z9079 Acquired absence of other genital organ(s): Secondary | ICD-10-CM | POA: Insufficient documentation

## 2016-05-05 DIAGNOSIS — G4730 Sleep apnea, unspecified: Secondary | ICD-10-CM | POA: Diagnosis not present

## 2016-05-05 DIAGNOSIS — E785 Hyperlipidemia, unspecified: Secondary | ICD-10-CM | POA: Insufficient documentation

## 2016-05-05 DIAGNOSIS — I209 Angina pectoris, unspecified: Secondary | ICD-10-CM | POA: Insufficient documentation

## 2016-05-05 DIAGNOSIS — F419 Anxiety disorder, unspecified: Secondary | ICD-10-CM | POA: Diagnosis not present

## 2016-05-05 NOTE — Progress Notes (Signed)
New pt with new dx testicular cancer.  Some left back pain and he thinks from well known kidney stones rating about 2.  Drinks and eats well., bowels good.

## 2016-05-07 ENCOUNTER — Encounter: Payer: Self-pay | Admitting: Hematology and Oncology

## 2016-05-07 NOTE — Progress Notes (Signed)
[facility] day:  05/07/2016  Chief Complaint: Nathan Calderon is a 55 y.o. male with testicular cancer who is referred in consultation by Dr. [name] for assessment and management.  HPI:  The patient notes a history of a undescended right testicle requiring orchiopexy at the age of 38. He presented with right testicular pain and was referred to Dr. Erlene Quan on 03/26/2016.  Exam revealed a firm nodular non-tender right testicle concerning for a mass  Right scrotal ultrasound on 03/31/2016 revealed a 2.5 x 2.1 x 2.5 cm right testicular mass with microcalcifications.  Labs on 04/02/2016 revealed an AFP of 2.2, beta-hCG less than 1 and LDH 203.  CBC on 04/06/2016 hematocrit 39.5, hemoglobin 13.7, platelets 232,000 and white count 6800.  The patient underwent right radical orchiectomy and left vasectomy on 04/08/2016 by Dr. [name].  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  There was lymphovascular invasion. Pathologic stage was T2Nx (stage IB).  He tolerated his surgery well.  Abdominal and pelvic CT scan on 04/15/2016 revealed left nephrolithiasis, hepatic steatosis changes s/p post right orchiectomy. There was no evidence of metastatic disease. Chest CT on 05/03/2016 revealed no evidence of metastatic disease.  Symptomatically, he denies any complaints.   Past Medical History  Diagnosis Date  . Angina     2006, had cardiac cath at the time  . Diabetes mellitus   . Kidney stone     hx of  . Headache(784.0)   . Hypertension   . Hyperlipidemia   . Anxiety   . Sleep apnea   . Testicular cancer ([facility]) 04/08/2016    Past Surgical History  Procedure Laterality Date  . Eye surgery      x 2  . Lasik    . Hernia repair      with undecended testicle  . Shoulder surgery      left shoulder  . Pars plana vitrectomy  04/04/2012    Procedure: PARS PLANA VITRECTOMY  WITH 25 GAUGE;  Surgeon: [name], MD;  Location: [facility];  Service: Ophthalmology;  Laterality: Left;  with laser  . Orchiectomy Right 04/08/2016    Procedure: ORCHIECTOMY/ INGUINAL APPROACH;  Surgeon: [name], MD;  Location: [facility];  Service: Urology;  Laterality: Right;  . Vasectomy Left 04/08/2016    Procedure: VASECTOMY;  Surgeon: [name], MD;  Location: [facility];  Service: Urology;  Laterality: Left;    Family History  Problem Relation Age of Onset  . Kidney disease Neg Hx   . Prostate cancer Neg Hx   . Diabetes Mother   . Heart disease Mother   . Hypertension Mother   . Heart disease Father   . Ovarian cancer Maternal Grandmother     Social History:  reports that he has never smoked. He has never used smokeless tobacco. He reports that he drinks alcohol. He reports that he does not use illicit drugs.  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  The patient is accompanied by his girlfriend, son, and daughter today.  Allergies: No Known Allergies  Current Medications: Current Outpatient Prescriptions  Medication Sig Dispense Refill  . ALPRAZolam (XANAX) 0.5 MG tablet Take 0.5 mg by mouth every 6 (  six) hours as needed. for anxiety  5  . amLODipine (NORVASC) 10 MG tablet Take 10 mg by mouth daily.  5  . aspirin EC 81 MG tablet Take 81 mg by mouth daily.    Marland Kitchen atorvastatin (LIPITOR) 20 MG tablet Take 20 mg by mouth daily.    Marland Kitchen BAYER CONTOUR NEXT TEST test strip TEST TWO TIMES DAILY  3  . glimepiride (AMARYL) 2 MG tablet TAKE 1 TABLET BY MOUTH DAILY WITH FIRST MEAL  11  . Insulin Glargine (TOUJEO SOLOSTAR) 300 UNIT/ML SOPN Inject 65 Units into the skin every evening.    Marland Kitchen losartan (COZAAR) 100 MG tablet Take 100 mg by mouth daily.    . metFORMIN (GLUCOPHAGE) 1000 MG tablet Take 1,000 mg by  mouth 2 (two) times daily with a meal.    . MUSE 250 MCG pellet Reported on 04/23/2016  1  . [DISCONTINUED] hydrochlorothiazide (HYDRODIURIL) 50 MG tablet Take 50 mg by mouth daily.     No current facility-administered medications for this visit.    Review of Systems:  GENERAL:  Feels good.  Active.  No fevers, sweats or weight loss. PERFORMANCE STATUS (ECOG):  0 HEENT:  No visual changes, runny nose, sore throat, mouth sores or tenderness. Lungs: No shortness of breath or cough.  No hemoptysis. Cardiac:  No chest pain, palpitations, orthopnea, or PND. GI:  No nausea, vomiting, diarrhea, constipation, melena or hematochezia. GU:  No urgency, frequency, dysuria, or hematuria. Musculoskeletal:  No back pain.  No joint pain.  No muscle tenderness. Extremities:  No pain or swelling. Skin:  No rashes or skin changes. Neuro:  No headache, numbness or weakness, balance or coordination issues. Endocrine:  Diabetes.  No thyroid issues, hot flashes or night sweats. Psych:  No mood changes, depression or anxiety. Pain:  No focal pain. Review of systems:  All other systems reviewed and found to be negative.  Physical Exam: Blood pressure 167/84, pulse 93, temperature 98.4 F (36.9 C), temperature source Oral, resp. rate 18, height 6' 1.6" (1.869 m), weight 260 lb 2.3 oz (118 kg). GENERAL:  Well developed, well nourished, sitting comfortably in the exam room in no acute distress. MENTAL STATUS:  Alert and oriented to person, place and time. HEAD:  Short hair.  Male pattern baldness. Graying goatee.  Normocephalic, atraumatic, face symmetric, no Cushingoid features. EYES:  Glasses.  Brown eyes.  Pupils equal round and reactive to light and accomodation.  No conjunctivitis or scleral icterus. ENT:  Oropharynx clear without lesion.  Tongue normal. Mucous membranes moist.  RESPIRATORY:  Clear to auscultation without rales, wheezes or rhonchi. CARDIOVASCULAR:  Regular rate and rhythm without murmur,  rub or gallop. ABDOMEN:  Ventral hernia.  Soft, non-tender, with active bowel sounds, and no hepatosplenomegaly.  No masses. GENITOURINARY:  Normal penis.  s/p right orchiectomy with post-operative hematoma.  s/p left vasectomy with small eschar. SKIN:  No rashes, ulcers or lesions. EXTREMITIES: Right > left chronic lower extremity edema.  No skin discoloration or tenderness.  No palpable cords. LYMPH NODES: No palpable cervical, supraclavicular, axillary or inguinal adenopathy  NEUROLOGICAL: Unremarkable. PSYCH:  Appropriate.  No visits with results within 3 Day(s) from this visit. Latest known visit with results is:  Admission on 04/08/2016, Discharged on 04/08/2016  Component Date Value Ref Range Status  . Glucose-Capillary 04/08/2016 218* 65 - 99 mg/dL Final  . SURGICAL PATHOLOGY 04/08/2016    Final  Value:Surgical Pathology CASE: [id number] PATIENT: Lennell Toledo Surgical Pathology Report     SPECIMEN SUBMITTED: A. Left vas deferens B. Right testicle  CLINICAL HISTORY: Right testicular tumor; normal AFP, beta HCG, and LDH  PRE-OPERATIVE DIAGNOSIS: Right testicular tumor  POST-OPERATIVE DIAGNOSIS: Same as pre-op     DIAGNOSIS: A. VAS DEFERENS, LEFT; EXCISION: - SEGMENT OF VAS DEFERENS WITH FULL SECTION OF THE LUMEN EXAMINED.  B. TESTICULAR TUMOR, RIGHT; ORCHIECTOMY: - EMBRYONAL CARCINOMA. - INTRATUBULAR GERM CELL NEOPLASIA. - SEE CANCER SUMMARY BELOW.   TESTIS: Radical Orchiectomy Testis, Radical Orchiectomy Cancer Case Summary Procedure:     Radical orchiectomy Specimen Site: Testis structure (body structure) Tumor Site:    Testis structure SPECIMEN Specimen Laterality:     Right Tumor Focality:     Unifocal Tumor Size:    Greatest dimension of main tumor mass (cm) 3.1cm TUMOR Histologic Type:    Embryonal carcinoma EXTENT Macroscopic Exte                         nt of Tumor:  Confined to the testis Microscopic Tumor Extension:   +Rete testis MARGINS Spermatic Cord Margin:   Uninvolved by tumor Other Margin(s):    Not applicable ACCESSORY FINDINGS Lymph-Vascular Invasion: Present STAGE (pTNM) Primary Tumor (pT): pT2: Tumor limited to the testis and epididymis with vascular / lymphatic invasion, or tumor extending through the tunica albuginea with involvement of the tunica vaginalis Regional Lymph Nodes (pN) pNX: Cannot be assessed No nodes submitted or found Distant Metastasis (pM): Not applicable   GROSS DESCRIPTION:  A. Labeled: left vas deferens  Size: 0.3 x 0.2 x 0.2 cm  Other findings: pink to tan tubular fragment  Block summary: 1-entirely submitted intact for histologic processing  B. Labeled: right testicle  Type of procedure: radical  Laterality: right  Weight of specimen: 52 grams Size of specimen:           Testis: 4.6 x 3.2 x 3.5 cm           Spermatic cord: 5.2 x 4.0 x 1.5 cm           Epididym                         is: 2.3 x 0.8 x 0.6 cm  Structures attached to testis:. A moderate amount of attached fibrofatty tissue on external surface  Orientation: external surface inked blue  Number of masses: 1  Size(s) of mass(es): 3.1 x 2.6 x 2.2 cm  Description of mass(es): heterogeneous ill-defined firm tan to red with focal calcification  Confinement to testis: grossly confined  Relationship of tumor to adjacent anatomic structures:      Epididymis: grossly uninvolved      Tunica vaginalis: grossly uninvolved      Paratesticular soft tissues: grossly uninvolved      Spermatic cord: grossly uninvolved      Hilar soft tissue: grossly uninvolved      Other: attached fibrofatty tissue grossly uninvolved  Margins: 5.3 cm from spermatic cord margin  Description of remainder of tissue: stringy and tan  Block summary: 1-en face spermatic cord margin 2-mid spermatic cord 3-spermatic cord at entry to testis 4-10-representative sections of mass with uninvolved  parenchyma, epididymis                         , tunica and attached overlying fibrofatty tissue 11-representative right vas deferens (submitted  intact for histologic processing)   Final Diagnosis performed by Quay Burow, MD.  Electronically signed 04/12/2016 10:07:03AM    The electronic signature indicates that the named Attending Pathologist has evaluated the specimen  Technical component performed at [facility], [address] [place], [place] [address] Lab: [phone] Dir: Darrick Penna. Evette Doffing, MD  Professional component performed at [facility], [facility], [place], [place], [place] [address] Lab: [phone] Dir: Dellia Nims. Reuel Derby, MD    . Glucose-Capillary 04/08/2016 213* 65 - 99 mg/dL Final  . Comment 1 04/08/2016 Notify RN   Final    Assessment:  MARQUAVION ZENK is a 55 y.o. male with stage IB right testicular cancer s/p orchiectomy on 04/08/2016.  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  There was lymphovascular invasion. Pathologic stage was T2Nx (stage IB).    Abdominal and pelvic CT scan on 04/15/2016  And chest CT on 05/03/2016 revealed no evidence of metastatic disease.   Labs on 04/02/2016 revealed a normal AFP (2.2), beta-hCG (<1) and LDH (203).   Symptomatically, he denies any complaints.  He is healing well post surgery.  Plan: 1.  Review diagnosis, staging, and management of stage IB testicular germ cell cell cancer.  Discuss options of surveillance, retroperitoneal lymph node dissection, and 2 cycles of BEP chemotherapy.  Recommendation of 2 cycles of BEP.  Discuss risk of recurrence high (estimated > 50%) given embryonal pathology and lymphovascular invasion with observation.  With 2 cycles of chemotherapy, relapse free survival estimated > 95%.  2.  Discuss side effects associated with chemotherapy (bleomycin, etoposide, cisplatin) given every 21 days for 2 cycles.   Discuss first week of each cycle day 1-5 cisplatin and etoposide.  Discuss weekly bleomycin.  Discuss allergic reaction to bleomycin and potential pulmonary toxicities.  Discuss myelosuppression and hair loss.  Discuss renal issues with electrolyte wasting (potassium and magnesium) and high frequency hearing loss with cisplatin.  Discuss low risk of AML with etoposide.  Discuss baseline audiogram and pulmonary function testing.  3.  Discuss patient's concerns about missing work.  Letter to employer about utilizing year end vacation.  4.  Schedule chemotherapy class. 5.  Preauthorize bleomycin, etoposide, and cisplatin (BEP) x 2 cycles. 6.  Schedule audiogram. 7.  Schedule pulmonary function testing with [facility]. 8.  RTC for MD assessment, labs (CBC with diff, CMP, Mg, AFP, betaHCG, LDH), and cycle #1 BEP.   Lequita Asal, MD  05/05/2016

## 2016-05-10 ENCOUNTER — Other Ambulatory Visit: Payer: Self-pay | Admitting: *Deleted

## 2016-05-10 DIAGNOSIS — C6211 Malignant neoplasm of descended right testis: Secondary | ICD-10-CM

## 2016-05-11 NOTE — Patient Instructions (Signed)
Cisplatin injection What is this medicine? CISPLATIN (SIS pla tin) is a chemotherapy drug. It targets fast dividing cells, like cancer cells, and causes these cells to die. This medicine is used to treat many types of cancer like bladder, ovarian, and testicular cancers. This medicine may be used for other purposes; ask your health care provider or pharmacist if you have questions. What should I tell my health care provider before I take this medicine? They need to know if you have any of these conditions: -blood disorders -hearing problems -kidney disease -recent or ongoing radiation therapy -an unusual or allergic reaction to cisplatin, carboplatin, other chemotherapy, other medicines, foods, dyes, or preservatives -pregnant or trying to get pregnant -breast-feeding How should I use this medicine? This drug is given as an infusion into a vein. It is administered in a hospital or clinic by a specially trained health care professional. Talk to your pediatrician regarding the use of this medicine in children. Special care may be needed. Overdosage: If you think you have taken too much of this medicine contact a poison control center or emergency room at once. NOTE: This medicine is only for you. Do not share this medicine with others. What if I miss a dose? It is important not to miss a dose. Call your doctor or health care professional if you are unable to keep an appointment. What may interact with this medicine? -dofetilide -foscarnet -medicines for seizures -medicines to increase blood counts like filgrastim, pegfilgrastim, sargramostim -probenecid -pyridoxine used with altretamine -rituximab -some antibiotics like amikacin, gentamicin, neomycin, polymyxin B, streptomycin, tobramycin -sulfinpyrazone -vaccines -zalcitabine Talk to your doctor or health care professional before taking any of these medicines: -acetaminophen -aspirin -ibuprofen -ketoprofen -naproxen This list may  not describe all possible interactions. Give your health care provider a list of all the medicines, herbs, non-prescription drugs, or dietary supplements you use. Also tell them if you smoke, drink alcohol, or use illegal drugs. Some items may interact with your medicine. What should I watch for while using this medicine? Your condition will be monitored carefully while you are receiving this medicine. You will need important blood work done while you are taking this medicine. This drug may make you feel generally unwell. This is not uncommon, as chemotherapy can affect healthy cells as well as cancer cells. Report any side effects. Continue your course of treatment even though you feel ill unless your doctor tells you to stop. In some cases, you may be given additional medicines to help with side effects. Follow all directions for their use. Call your doctor or health care professional for advice if you get a fever, chills or sore throat, or other symptoms of a cold or flu. Do not treat yourself. This drug decreases your body's ability to fight infections. Try to avoid being around people who are sick. This medicine may increase your risk to bruise or bleed. Call your doctor or health care professional if you notice any unusual bleeding. Be careful brushing and flossing your teeth or using a toothpick because you may get an infection or bleed more easily. If you have any dental work done, tell your dentist you are receiving this medicine. Avoid taking products that contain aspirin, acetaminophen, ibuprofen, naproxen, or ketoprofen unless instructed by your doctor. These medicines may hide a fever. Do not become pregnant while taking this medicine. Women should inform their doctor if they wish to become pregnant or think they might be pregnant. There is a potential for serious side effects to   an unborn child. Talk to your health care professional or pharmacist for more information. Do not breast-feed an  infant while taking this medicine. Drink fluids as directed while you are taking this medicine. This will help protect your kidneys. Call your doctor or health care professional if you get diarrhea. Do not treat yourself. What side effects may I notice from receiving this medicine? Side effects that you should report to your doctor or health care professional as soon as possible: -allergic reactions like skin rash, itching or hives, swelling of the face, lips, or tongue -signs of infection - fever or chills, cough, sore throat, pain or difficulty passing urine -signs of decreased platelets or bleeding - bruising, pinpoint red spots on the skin, black, tarry stools, nosebleeds -signs of decreased red blood cells - unusually weak or tired, fainting spells, lightheadedness -breathing problems -changes in hearing -gout pain -low blood counts - This drug may decrease the number of white blood cells, red blood cells and platelets. You may be at increased risk for infections and bleeding. -nausea and vomiting -pain, swelling, redness or irritation at the injection site -pain, tingling, numbness in the hands or feet -problems with balance, movement -trouble passing urine or change in the amount of urine Side effects that usually do not require medical attention (report to your doctor or health care professional if they continue or are bothersome): -changes in vision -loss of appetite -metallic taste in the mouth or changes in taste This list may not describe all possible side effects. Call your doctor for medical advice about side effects. You may report side effects to FDA at 1-800-FDA-1088. Where should I keep my medicine? This drug is given in a hospital or clinic and will not be stored at home. NOTE: This sheet is a summary. It may not cover all possible information. If you have questions about this medicine, talk to your doctor, pharmacist, or health care provider.    2016, [employer]. (2008-02-20 14:40:54) Bleomycin injection What is this medicine? BLEOMYCIN (blee oh MYE sin) is a chemotherapy drug. It is used to treat many kinds of cancer like lymphoma, cervical cancer, head and neck cancer, and testicular cancer. It is also used to prevent and to treat fluid build-up around the lungs caused by some cancers. This medicine may be used for other purposes; ask your health care provider or pharmacist if you have questions. What should I tell my health care provider before I take this medicine? They need to know if you have any of these conditions: -cigarette smoker -kidney disease -lung disease -recent or ongoing radiation therapy -an unusual or allergic reaction to bleomycin, other chemotherapy agents, other medicines, foods, dyes, or preservatives -pregnant or trying to get pregnant -breast-feeding How should I use this medicine? This drug is given as an infusion into a vein or a body cavity. It can also be given as an injection into a muscle or under the skin. It is administered in a hospital or clinic by a specially trained health care professional. Talk to your pediatrician regarding the use of this medicine in children. Special care may be needed. Overdosage: If you think you have taken too much of this medicine contact a poison control center or emergency room at once. NOTE: This medicine is only for you. Do not share this medicine with others. What if I miss a dose? It is important not to miss your dose. Call your doctor or health care professional if you are unable to keep an  appointment. What may interact with this medicine? -certain antibiotics given by injection -cisplatin -cyclosporine -diuretics -foscarnet -medicines to increase blood counts like filgrastim, pegfilgrastim, sargramostim -vaccines This list may not describe all possible interactions. Give your health care provider a list of all the medicines, herbs, non-prescription drugs, or  dietary supplements you use. Also tell them if you smoke, drink alcohol, or use illegal drugs. Some items may interact with your medicine. What should I watch for while using this medicine? Visit your doctor for checks on your progress. This drug may make you feel generally unwell. This is not uncommon, as chemotherapy can affect healthy cells as well as cancer cells. Report any side effects. Continue your course of treatment even though you feel ill unless your doctor tells you to stop. Call your doctor or health care professional for advice if you get a fever, chills or sore throat, or other symptoms of a cold or flu. Do not treat yourself. This drug decreases your body's ability to fight infections. Try to avoid being around people who are sick. Avoid taking products that contain aspirin, acetaminophen, ibuprofen, naproxen, or ketoprofen unless instructed by your doctor. These medicines may hide a fever. Do not become pregnant while taking this medicine. Women should inform their doctor if they wish to become pregnant or think they might be pregnant. There is a potential for serious side effects to an unborn child. Talk to your health care professional or pharmacist for more information. Do not breast-feed an infant while taking this medicine. There is a maximum amount of this medicine you should receive throughout your life. The amount depends on the medical condition being treated and your overall health. Your doctor will watch how much of this medicine you receive in your lifetime. Tell your doctor if you have taken this medicine before. What side effects may I notice from receiving this medicine? Side effects that you should report to your doctor or health care professional as soon as possible: -allergic reactions like skin rash, itching or hives, swelling of the face, lips, or tongue -breathing problems -chest pain -confusion -cough -fast, irregular heartbeat -feeling faint or lightheaded,  falls -fever or chills -mouth sores -pain, tingling, numbness in the hands or feet -trouble passing urine or change in the amount of urine -yellowing of the eyes or skin Side effects that usually do not require medical attention (report to your doctor or health care professional if they continue or are bothersome): -darker skin color -hair loss -irritation at site where injected -loss of appetite -nail changes -nausea and vomiting -weight loss This list may not describe all possible side effects. Call your doctor for medical advice about side effects. You may report side effects to FDA at 1-800-FDA-1088. Where should I keep my medicine? This drug is given in a hospital or clinic and will not be stored at home. NOTE: This sheet is a summary. It may not cover all possible information. If you have questions about this medicine, talk to your doctor, pharmacist, or health care provider.    2016, [employer]. (2013-03-13 09:36:48) Etoposide, VP-16 injection What is this medicine? ETOPOSIDE, VP-16 (e toe POE side) is a chemotherapy drug. It is used to treat testicular cancer, lung cancer, and other cancers. This medicine may be used for other purposes; ask your health care provider or pharmacist if you have questions. What should I tell my health care provider before I take this medicine? They need to know if you have any of these conditions: -infection -  kidney disease -low blood counts, like low white cell, platelet, or red cell counts -an unusual or allergic reaction to etoposide, other chemotherapeutic agents, other medicines, foods, dyes, or preservatives -pregnant or trying to get pregnant -breast-feeding How should I use this medicine? This medicine is for infusion into a vein. It is administered in a hospital or clinic by a specially trained health care professional. Talk to your pediatrician regarding the use of this medicine in children. Special care may be  needed. Overdosage: If you think you have taken too much of this medicine contact a poison control center or emergency room at once. NOTE: This medicine is only for you. Do not share this medicine with others. What if I miss a dose? It is important not to miss your dose. Call your doctor or health care professional if you are unable to keep an appointment. What may interact with this medicine? -aspirin -certain medications for seizures like carbamazepine, phenobarbital, phenytoin, valproic acid -cyclosporine -levamisole -warfarin This list may not describe all possible interactions. Give your health care provider a list of all the medicines, herbs, non-prescription drugs, or dietary supplements you use. Also tell them if you smoke, drink alcohol, or use illegal drugs. Some items may interact with your medicine. What should I watch for while using this medicine? Visit your doctor for checks on your progress. This drug may make you feel generally unwell. This is not uncommon, as chemotherapy can affect healthy cells as well as cancer cells. Report any side effects. Continue your course of treatment even though you feel ill unless your doctor tells you to stop. In some cases, you may be given additional medicines to help with side effects. Follow all directions for their use. Call your doctor or health care professional for advice if you get a fever, chills or sore throat, or other symptoms of a cold or flu. Do not treat yourself. This drug decreases your body's ability to fight infections. Try to avoid being around people who are sick. This medicine may increase your risk to bruise or bleed. Call your doctor or health care professional if you notice any unusual bleeding. Be careful brushing and flossing your teeth or using a toothpick because you may get an infection or bleed more easily. If you have any dental work done, tell your dentist you are receiving this medicine. Avoid taking products that  contain aspirin, acetaminophen, ibuprofen, naproxen, or ketoprofen unless instructed by your doctor. These medicines may hide a fever. Do not become pregnant while taking this medicine or for at least 6 months after stopping it. Women should inform their doctor if they wish to become pregnant or think they might be pregnant. Women of child-bearing potential will need to have a negative pregnancy test before starting this medicine. There is a potential for serious side effects to an unborn child. Talk to your health care professional or pharmacist for more information. Do not breast-feed an infant while taking this medicine. Men must use a latex condom during sexual contact with a woman while taking this medicine and for at least 4 months after stopping it. A latex condom is needed even if you have had a vasectomy. Contact your doctor right away if your partner becomes pregnant. Do not donate sperm while taking this medicine and for at least 4 months after you stop taking this medicine. Men should inform their doctors if they wish to father a child. This medicine may lower sperm counts. What side effects may I notice from  receiving this medicine? Side effects that you should report to your doctor or health care professional as soon as possible: -allergic reactions like skin rash, itching or hives, swelling of the face, lips, or tongue -low blood counts - this medicine may decrease the number of white blood cells, red blood cells and platelets. You may be at increased risk for infections and bleeding. -signs of infection - fever or chills, cough, sore throat, pain or difficulty passing urine -signs of decreased platelets or bleeding - bruising, pinpoint red spots on the skin, black, tarry stools, blood in the urine -signs of decreased red blood cells - unusually weak or tired, fainting spells, lightheadedness -breathing problems -changes in vision -mouth or throat sores or ulcers -pain, redness, swelling  or irritation at the injection site -pain, tingling, numbness in the hands or feet -redness, blistering, peeling or loosening of the skin, including inside the mouth -seizures -vomiting Side effects that usually do not require medical attention (report to your doctor or health care professional if they continue or are bothersome): -diarrhea -hair loss -loss of appetite -nausea -stomach pain This list may not describe all possible side effects. Call your doctor for medical advice about side effects. You may report side effects to FDA at 1-800-FDA-1088. Where should I keep my medicine? This drug is given in a hospital or clinic and will not be stored at home. NOTE: This sheet is a summary. It may not cover all possible information. If you have questions about this medicine, talk to your doctor, pharmacist, or health care provider.    2016, [employer]. (2014-07-11 12:32:50)

## 2016-05-13 ENCOUNTER — Inpatient Hospital Stay: Payer: BLUE CROSS/BLUE SHIELD

## 2016-05-13 ENCOUNTER — Ambulatory Visit: Payer: BLUE CROSS/BLUE SHIELD | Attending: Hematology and Oncology

## 2016-05-13 DIAGNOSIS — C6211 Malignant neoplasm of descended right testis: Secondary | ICD-10-CM

## 2016-05-17 ENCOUNTER — Inpatient Hospital Stay: Payer: BLUE CROSS/BLUE SHIELD

## 2016-05-17 ENCOUNTER — Other Ambulatory Visit: Payer: Self-pay

## 2016-05-17 ENCOUNTER — Inpatient Hospital Stay (HOSPITAL_BASED_OUTPATIENT_CLINIC_OR_DEPARTMENT_OTHER): Payer: BLUE CROSS/BLUE SHIELD | Admitting: Hematology and Oncology

## 2016-05-17 ENCOUNTER — Other Ambulatory Visit: Payer: Self-pay | Admitting: Hematology and Oncology

## 2016-05-17 ENCOUNTER — Encounter: Payer: Self-pay | Admitting: Hematology and Oncology

## 2016-05-17 VITALS — BP 158/76 | HR 91 | Temp 95.7°F | Resp 18 | Ht 73.5 in | Wt 257.5 lb

## 2016-05-17 DIAGNOSIS — C6211 Malignant neoplasm of descended right testis: Secondary | ICD-10-CM | POA: Diagnosis not present

## 2016-05-17 DIAGNOSIS — Z9079 Acquired absence of other genital organ(s): Secondary | ICD-10-CM

## 2016-05-17 DIAGNOSIS — D649 Anemia, unspecified: Secondary | ICD-10-CM | POA: Diagnosis not present

## 2016-05-17 DIAGNOSIS — Z79899 Other long term (current) drug therapy: Secondary | ICD-10-CM

## 2016-05-17 LAB — COMPREHENSIVE METABOLIC PANEL
ALT: 31 U/L (ref 17–63)
AST: 29 U/L (ref 15–41)
Albumin: 3.8 g/dL (ref 3.5–5.0)
Alkaline Phosphatase: 54 U/L (ref 38–126)
Anion gap: 7 (ref 5–15)
BUN: 15 mg/dL (ref 6–20)
CO2: 24 mmol/L (ref 22–32)
Calcium: 8.7 mg/dL — ABNORMAL LOW (ref 8.9–10.3)
Chloride: 103 mmol/L (ref 101–111)
Creatinine, Ser: 0.93 mg/dL (ref 0.61–1.24)
GFR calc Af Amer: >60 mL/min (ref >60)
GFR calc non Af Amer: >60 mL/min (ref >60)
Glucose, Bld: 263 mg/dL — ABNORMAL HIGH (ref 65–99)
Potassium: 3.7 mmol/L (ref 3.5–5.1)
Sodium: 134 mmol/L — ABNORMAL LOW (ref 135–145)
Total Bilirubin: 0.7 mg/dL (ref 0.3–1.2)
Total Protein: 7.2 g/dL (ref 6.5–8.1)

## 2016-05-17 LAB — CBC WITH DIFFERENTIAL/PLATELET
Basophils Absolute: 0 10*3/uL (ref 0–0.1)
Basophils Relative: 1 %
Eosinophils Absolute: 0.2 10*3/uL (ref 0–0.7)
Eosinophils Relative: 4 %
HCT: 37.8 % — ABNORMAL LOW (ref 40.0–52.0)
Hemoglobin: 13.8 g/dL (ref 13.0–18.0)
Lymphocytes Relative: 30 %
Lymphs Abs: 2 10*3/uL (ref 1.0–3.6)
MCH: 32.8 pg (ref 26.0–34.0)
MCHC: 36.6 g/dL — ABNORMAL HIGH (ref 32.0–36.0)
MCV: 89.8 fL (ref 80.0–100.0)
Monocytes Absolute: 0.5 10*3/uL (ref 0.2–1.0)
Monocytes Relative: 7 %
Neutro Abs: 3.9 10*3/uL (ref 1.4–6.5)
Neutrophils Relative %: 58 %
Platelets: 260 10*3/uL (ref 150–440)
RBC: 4.21 MIL/uL — ABNORMAL LOW (ref 4.40–5.90)
RDW: 12.8 % (ref 11.5–14.5)
WBC: 6.7 10*3/uL (ref 3.8–10.6)

## 2016-05-17 LAB — LACTATE DEHYDROGENASE: LDH: 169 U/L (ref 98–192)

## 2016-05-17 LAB — MAGNESIUM: Magnesium: 1.6 mg/dL — ABNORMAL LOW (ref 1.7–2.4)

## 2016-05-17 MED ORDER — SODIUM CHLORIDE 0.9 % IV SOLN
30.0000 [IU] | Freq: Once | INTRAVENOUS | Status: AC
  Administered 2016-05-17: 30 [IU] via INTRAVENOUS
  Filled 2016-05-17: qty 10

## 2016-05-17 MED ORDER — POTASSIUM CHLORIDE 2 MEQ/ML IV SOLN
Freq: Once | INTRAVENOUS | Status: AC
  Administered 2016-05-17: 10:00:00 via INTRAVENOUS
  Filled 2016-05-17: qty 1000

## 2016-05-17 MED ORDER — SODIUM CHLORIDE 0.9 % IV SOLN
Freq: Once | INTRAVENOUS | Status: AC
  Administered 2016-05-17: 10:00:00 via INTRAVENOUS
  Filled 2016-05-17: qty 1000

## 2016-05-17 MED ORDER — SODIUM CHLORIDE 0.9 % IV SOLN
Freq: Once | INTRAVENOUS | Status: AC
  Administered 2016-05-17: 12:00:00 via INTRAVENOUS
  Filled 2016-05-17: qty 5

## 2016-05-17 MED ORDER — SODIUM CHLORIDE 0.9 % IV SOLN
100.0000 mg/m2 | Freq: Once | INTRAVENOUS | Status: AC
  Administered 2016-05-17: 250 mg via INTRAVENOUS
  Filled 2016-05-17: qty 12.5

## 2016-05-17 MED ORDER — PALONOSETRON HCL INJECTION 0.25 MG/5ML
0.2500 mg | Freq: Once | INTRAVENOUS | Status: AC
  Administered 2016-05-17: 0.25 mg via INTRAVENOUS
  Filled 2016-05-17: qty 5

## 2016-05-17 MED ORDER — SODIUM CHLORIDE 0.9 % IV SOLN
20.0000 mg/m2 | Freq: Once | INTRAVENOUS | Status: AC
  Administered 2016-05-17: 50 mg via INTRAVENOUS
  Filled 2016-05-17: qty 50

## 2016-05-17 NOTE — Progress Notes (Signed)
[facility] day:  05/17/2016  Chief Complaint: Nathan Calderon is a 55 y.o. male with stage IB right testicular cancer who is seen for assessment prior to cycle #1 bleomycin , etoposide, and cisplatin (BEP).  HPI:  The patient was last seen in the medical oncology clinic on 05/05/2016.  At that time, he was seen for initial consultation.  Symptomatically, he denied any complaints.  He was healing well post surgery.  We discussed treatment options.  Decision was made to purse 2 cycles of BEP.  He underwent pulmonary function tests and a baseline audiogram.  PFTs on 05/13/2016 revealed FVC 3.82 liters (75%), FEV1 3.06 liters (84%), TLC 5.5  liters (77%), VC 4.00 liters (79%), and DLCO 28.6 ml/mmHg/min (93%).  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  He notes that his diet is good.  He denies any melena or hematochezia.    Symptomatically, he denies any complaints.   Past Medical History  Diagnosis Date  . Angina     2006, had cardiac cath at the time  . Diabetes mellitus   . Kidney stone     hx of  . Headache(784.0)   . Hypertension   . Hyperlipidemia   . Anxiety   . Sleep apnea   . Testicular cancer ([place]) 04/08/2016    Past Surgical History  Procedure Laterality Date  . Eye surgery      x 2  . Lasik    . Hernia repair      with undecended testicle  . Shoulder surgery      left shoulder  . Pars plana vitrectomy  04/04/2012    Procedure: PARS PLANA VITRECTOMY WITH 25 GAUGE;  Surgeon: [name], MD;  Location: [place];  Service: Ophthalmology;  Laterality: Left;  with laser  . Orchiectomy Right 04/08/2016    Procedure: ORCHIECTOMY/ INGUINAL APPROACH;  Surgeon: [name], MD;  Location: [facility];  Service: Urology;  Laterality: Right;  . Vasectomy Left 04/08/2016    Procedure: VASECTOMY;  Surgeon: [name], MD;  Location: [facility];  Service: Urology;  Laterality: Left;     Family History  Problem Relation Age of Onset  . Kidney disease Neg Hx   . Prostate cancer Neg Hx   . Diabetes Mother   . Heart disease Mother   . Hypertension Mother   . Heart disease Father   . Ovarian cancer Maternal Grandmother     Social History:  reports that he has never smoked. He has never used smokeless tobacco. He reports that he drinks alcohol. He reports that he does not use illicit drugs.  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  The patient is accompanied by his girlfriend today.  Allergies: No Known Allergies  Current Medications: Current Outpatient Prescriptions  Medication Sig Dispense Refill  . ALPRAZolam (XANAX) 0.5 MG tablet Take 0.5 mg by mouth every 6 (six) hours as needed. for anxiety  5  . amLODipine (NORVASC) 10 MG tablet Take 10 mg by mouth daily.  5  . aspirin EC 81 MG tablet Take 81 mg by mouth daily.    Marland Kitchen atorvastatin (LIPITOR) 20 MG tablet Take 20 mg by mouth daily.    Marland Kitchen BAYER CONTOUR  NEXT TEST test strip TEST TWO TIMES DAILY  3  . docusate sodium (COLACE) 100 MG capsule Take 100 mg by mouth 2 (two) times daily.  0  . glimepiride (AMARYL) 2 MG tablet TAKE 1 TABLET BY MOUTH DAILY WITH FIRST MEAL  11  . Insulin Glargine (TOUJEO SOLOSTAR) 300 UNIT/ML SOPN Inject 65 Units into the skin every evening.    Marland Kitchen losartan (COZAAR) 100 MG tablet Take 100 mg by mouth daily.    . metFORMIN (GLUCOPHAGE) 1000 MG tablet Take 1,000 mg by mouth 2 (two) times daily with a meal.    . MUSE 250 MCG pellet Reported on 04/23/2016  1  . [DISCONTINUED] hydrochlorothiazide (HYDRODIURIL) 50 MG tablet Take 50 mg by mouth daily.     No current facility-administered medications for this visit.    Review of Systems:  GENERAL:  Feels good.  Active.  No fevers, sweats or weight loss. PERFORMANCE STATUS  (ECOG):  0 HEENT:  No visual changes, runny nose, sore throat, mouth sores or tenderness. Lungs: No shortness of breath or cough.  No hemoptysis. Cardiac:  No chest pain, palpitations, orthopnea, or PND. GI:  No nausea, vomiting, diarrhea, constipation, melena or hematochezia. GU:  No urgency, frequency, dysuria, or hematuria. Musculoskeletal:  No back pain.  No joint pain.  No muscle tenderness. Extremities:  No pain or swelling. Skin:  No rashes or skin changes. Neuro:  No headache, numbness or weakness, balance or coordination issues. Endocrine:  Diabetes.  No thyroid issues, hot flashes or night sweats. Psych:  No mood changes, depression or anxiety. Pain:  No focal pain. Review of systems:  All other systems reviewed and found to be negative.  Physical Exam: Blood pressure 158/76, pulse 91, temperature 95.7 F (35.4 C), temperature source Tympanic, resp. rate 18, height 6' 1.5" (1.867 m), weight 257 lb 8 oz (116.8 kg). GENERAL:  Well developed, well nourished, gentleman sitting comfortably in the exam room in no acute distress. MENTAL STATUS:  Alert and oriented to person, place and time. HEAD:  Short hair.  Male pattern baldness. Graying beard.  Normocephalic, atraumatic, face symmetric, no Cushingoid features. EYES:  Glasses.  Brown eyes.  Pupils equal round and reactive to light and accomodation.  No conjunctivitis or scleral icterus. ENT:  Oropharynx clear without lesion.  Tongue normal. Mucous membranes moist.  RESPIRATORY:  Clear to auscultation without rales, wheezes or rhonchi. CARDIOVASCULAR:  Regular rate and rhythm without murmur, rub or gallop. ABDOMEN:  Ventral hernia.  Soft, non-tender, with active bowel sounds, and no hepatosplenomegaly.  No masses. SKIN:  No rashes, ulcers or lesions. EXTREMITIES: Right > left chronic lower extremity edema.  No skin discoloration or tenderness.  No palpable cords. LYMPH NODES: No palpable cervical, supraclavicular, axillary or  inguinal adenopathy  NEUROLOGICAL: Unremarkable. PSYCH:  Appropriate.  Appointment on 05/17/2016  Component Date Value Ref Range Status  . WBC 05/17/2016 6.7  3.8 - 10.6 K/uL Final  . RBC 05/17/2016 4.21* 4.40 - 5.90 MIL/uL Final  . Hemoglobin 05/17/2016 13.8  13.0 - 18.0 g/dL Final   RESULT REPEATED AND VERIFIED  . HCT 05/17/2016 37.8* 40.0 - 52.0 % Final   RESULT REPEATED AND VERIFIED  . MCV 05/17/2016 89.8  80.0 - 100.0 fL Final  . MCH 05/17/2016 32.8  26.0 - 34.0 pg Final  . MCHC 05/17/2016 36.6* 32.0 - 36.0 g/dL Final  . RDW 05/17/2016 12.8  11.5 - 14.5 % Final  . Platelets 05/17/2016 260  150 - 440 K/uL Final  .  Neutrophils Relative % 05/17/2016 58%   Final  . Neutro Abs 05/17/2016 3.9  1.4 - 6.5 K/uL Final  . Lymphocytes Relative 05/17/2016 30%   Final  . Lymphs Abs 05/17/2016 2.0  1.0 - 3.6 K/uL Final  . Monocytes Relative 05/17/2016 7%   Final  . Monocytes Absolute 05/17/2016 0.5  0.2 - 1.0 K/uL Final  . Eosinophils Relative 05/17/2016 4%   Final  . Eosinophils Absolute 05/17/2016 0.2  0 - 0.7 K/uL Final  . Basophils Relative 05/17/2016 1%   Final  . Basophils Absolute 05/17/2016 0.0  0 - 0.1 K/uL Final  . Sodium 05/17/2016 134* 135 - 145 mmol/L Final  . Potassium 05/17/2016 3.7  3.5 - 5.1 mmol/L Final  . Chloride 05/17/2016 103  101 - 111 mmol/L Final  . CO2 05/17/2016 24  22 - 32 mmol/L Final  . Glucose, Bld 05/17/2016 263* 65 - 99 mg/dL Final  . BUN 05/17/2016 15  6 - 20 mg/dL Final  . Creatinine, Ser 05/17/2016 0.93  0.61 - 1.24 mg/dL Final  . Calcium 05/17/2016 8.7* 8.9 - 10.3 mg/dL Final  . Total Protein 05/17/2016 7.2  6.5 - 8.1 g/dL Final  . Albumin 05/17/2016 3.8  3.5 - 5.0 g/dL Final  . AST 05/17/2016 29  15 - 41 U/L Final  . ALT 05/17/2016 31  17 - 63 U/L Final  . Alkaline Phosphatase 05/17/2016 54  38 - 126 U/L Final  . Total Bilirubin 05/17/2016 0.7  0.3 - 1.2 mg/dL Final  . GFR calc non Af Amer 05/17/2016 >60  >60 mL/min Final  . GFR calc Af Amer  05/17/2016 >60  >60 mL/min Final   Comment: (NOTE) The eGFR has been calculated using the CKD EPI equation. This calculation has not been validated in all clinical situations. eGFR's persistently <60 mL/min signify possible Chronic Kidney Disease.   . Anion gap 05/17/2016 7  5 - 15 Final  . LDH 05/17/2016 169  98 - 192 U/L Final    Assessment:  ACXEL DINGEE is a 55 y.o. male with stage IB right testicular cancer s/p orchiectomy on 04/08/2016.  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  There was lymphovascular invasion. Pathologic stage was T2Nx (stage IB).    Abdominal and pelvic CT scan on 04/15/2016 and chest CT on 05/03/2016 revealed no evidence of metastatic disease.   Labs on 04/02/2016 revealed a normal AFP (2.2), beta-hCG (<1) and LDH (203).   PFTs on 05/13/2016 revealed FVC 3.82 liters (75%), FEV1 3.06 liters (84%), TLC 5.5  liters (77%), VC 4.00 liters (79%), and DLCO 28.6 ml/mmHg/min (93%).  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  Symptomatically, he denies any complaints.  He has a normocytic anemia.  Magnesium is slightly low.  Plan: 1.  Labs today:  CBC with diff, CMP, Mg, AFP, betaHCG, LDH. 2.  Discuss results of PFTs and audiogram. 3.  Discuss chemotherapy plan.  Patient consented to treatment. 4.  Discuss anemia.  Patient notes a good diet.  Denies any melena or hematochezia.  Patient denies prior colonoscopy.  Discuss work-up (labs and guaiac cards).  Anticipate colonoscopy following recovery from chemotherapy. 5.  Begin day 1 of cycle #1 BEP.  Patient to receive etoposide and cisplatin day 1-5 with hydration and weekly bleomycin. 7.  RTC in 1 week for MD assessment, labs (CBC with diff, BMP, Mg), and day 8 bleomycin.  Addendum:  Patient's magnesium is low.  Will reheck on 05/19/2016 and supplement as needed.   Lequita Asal, MD   05/17/2016, 1:38 PM

## 2016-05-17 NOTE — Progress Notes (Signed)
Pt reports he cut out his alcohol.  Last beer on Saturday.  Reports ankles swell every now and again more in the summer and because of walking.

## 2016-05-18 ENCOUNTER — Telehealth: Payer: Self-pay | Admitting: *Deleted

## 2016-05-18 ENCOUNTER — Inpatient Hospital Stay: Payer: BLUE CROSS/BLUE SHIELD

## 2016-05-18 ENCOUNTER — Other Ambulatory Visit: Payer: Self-pay | Admitting: Hematology and Oncology

## 2016-05-18 VITALS — BP 158/72 | HR 86 | Temp 96.5°F | Resp 18

## 2016-05-18 DIAGNOSIS — C6211 Malignant neoplasm of descended right testis: Secondary | ICD-10-CM | POA: Diagnosis not present

## 2016-05-18 LAB — AFP TUMOR MARKER: AFP-Tumor Marker: 2.1 ng/mL (ref 0.0–8.3)

## 2016-05-18 LAB — BETA HCG QUANT (REF LAB): Beta hCG, Tumor Marker: <1 m[IU]/mL (ref 0–3)

## 2016-05-18 MED ORDER — SODIUM CHLORIDE 0.9 % IV SOLN
100.0000 mg/m2 | Freq: Once | INTRAVENOUS | Status: AC
  Administered 2016-05-18: 250 mg via INTRAVENOUS
  Filled 2016-05-18: qty 12.5

## 2016-05-18 MED ORDER — SODIUM CHLORIDE 0.9 % IV SOLN
Freq: Once | INTRAVENOUS | Status: AC
  Administered 2016-05-18: 09:00:00 via INTRAVENOUS
  Filled 2016-05-18: qty 1000

## 2016-05-18 MED ORDER — POTASSIUM CHLORIDE 2 MEQ/ML IV SOLN
Freq: Once | INTRAVENOUS | Status: AC
  Administered 2016-05-18: 10:00:00 via INTRAVENOUS
  Filled 2016-05-18: qty 1000

## 2016-05-18 MED ORDER — SODIUM CHLORIDE 0.9 % IV SOLN
10.0000 mg | Freq: Once | INTRAVENOUS | Status: AC
  Administered 2016-05-18: 10 mg via INTRAVENOUS
  Filled 2016-05-18: qty 1

## 2016-05-18 MED ORDER — SODIUM CHLORIDE 0.9 % IV SOLN
20.0000 mg/m2 | Freq: Once | INTRAVENOUS | Status: AC
  Administered 2016-05-18: 50 mg via INTRAVENOUS
  Filled 2016-05-18: qty 50

## 2016-05-18 NOTE — Telephone Encounter (Signed)
Nathan Calderon called from infusion in regards to pt stating sugar almost 400 this am.  Dr. [name] notified and she called his PCP Dr. Arline Asp [phone] and [name] spoke to the dr lamb and she would like to see him and he is to check his sugar on reg. Basis. Called back to office and got an appt for 6/21 6:30 and was told that he should check his sugars three times a day before meals and  Bring to her office. Pt notified of the date for md appt and checking sugars.

## 2016-05-18 NOTE — Telephone Encounter (Signed)
Got a call from [facility] in infusion that pt sugar was over 400.  Dr. [name] called [facility] and spoke to DR. Arline Asp

## 2016-05-19 ENCOUNTER — Inpatient Hospital Stay: Payer: BLUE CROSS/BLUE SHIELD

## 2016-05-19 ENCOUNTER — Other Ambulatory Visit: Payer: Self-pay | Admitting: Hematology and Oncology

## 2016-05-19 ENCOUNTER — Other Ambulatory Visit: Payer: BLUE CROSS/BLUE SHIELD

## 2016-05-19 VITALS — BP 166/81 | HR 80 | Temp 95.9°F | Resp 18

## 2016-05-19 DIAGNOSIS — C6211 Malignant neoplasm of descended right testis: Secondary | ICD-10-CM

## 2016-05-19 DIAGNOSIS — D649 Anemia, unspecified: Secondary | ICD-10-CM

## 2016-05-19 LAB — CBC WITH DIFFERENTIAL/PLATELET
Basophils Absolute: 0 10*3/uL (ref 0–0.1)
Basophils Relative: 0 %
Eosinophils Absolute: 0 10*3/uL (ref 0–0.7)
Eosinophils Relative: 0 %
HCT: 37.5 % — ABNORMAL LOW (ref 40.0–52.0)
Hemoglobin: 13.4 g/dL (ref 13.0–18.0)
Lymphocytes Relative: 19 %
Lymphs Abs: 2 10*3/uL (ref 1.0–3.6)
MCH: 32.3 pg (ref 26.0–34.0)
MCHC: 35.7 g/dL (ref 32.0–36.0)
MCV: 90.4 fL (ref 80.0–100.0)
Monocytes Absolute: 0.6 10*3/uL (ref 0.2–1.0)
Monocytes Relative: 6 %
Neutro Abs: 7.9 10*3/uL — ABNORMAL HIGH (ref 1.4–6.5)
Neutrophils Relative %: 75 %
Platelets: 283 10*3/uL (ref 150–440)
RBC: 4.15 MIL/uL — ABNORMAL LOW (ref 4.40–5.90)
RDW: 12.6 % (ref 11.5–14.5)
WBC: 10.5 10*3/uL (ref 3.8–10.6)

## 2016-05-19 LAB — COMPREHENSIVE METABOLIC PANEL
ALT: 54 U/L (ref 17–63)
AST: 57 U/L — ABNORMAL HIGH (ref 15–41)
Albumin: 3.8 g/dL (ref 3.5–5.0)
Alkaline Phosphatase: 52 U/L (ref 38–126)
Anion gap: 8 (ref 5–15)
BUN: 18 mg/dL (ref 6–20)
CO2: 24 mmol/L (ref 22–32)
Calcium: 8.7 mg/dL — ABNORMAL LOW (ref 8.9–10.3)
Chloride: 99 mmol/L — ABNORMAL LOW (ref 101–111)
Creatinine, Ser: 1.01 mg/dL (ref 0.61–1.24)
GFR calc Af Amer: >60 mL/min (ref >60)
GFR calc non Af Amer: >60 mL/min (ref >60)
Glucose, Bld: 214 mg/dL — ABNORMAL HIGH (ref 65–99)
Potassium: 3.8 mmol/L (ref 3.5–5.1)
Sodium: 131 mmol/L — ABNORMAL LOW (ref 135–145)
Total Bilirubin: 1 mg/dL (ref 0.3–1.2)
Total Protein: 7.5 g/dL (ref 6.5–8.1)

## 2016-05-19 LAB — VITAMIN B12: Vitamin B-12: 350 pg/mL (ref 180–914)

## 2016-05-19 LAB — RETICULOCYTES
RBC.: 4.15 MIL/uL — ABNORMAL LOW (ref 4.40–5.90)
Retic Count, Absolute: 99.6 10*3/uL (ref 19.0–183.0)
Retic Ct Pct: 2.4 % (ref 0.4–3.1)

## 2016-05-19 LAB — TSH: TSH: 2.737 u[IU]/mL (ref 0.350–4.500)

## 2016-05-19 LAB — FERRITIN: Ferritin: 186 ng/mL (ref 24–336)

## 2016-05-19 LAB — SEDIMENTATION RATE: Sed Rate: 27 mm/hr — ABNORMAL HIGH (ref 0–20)

## 2016-05-19 LAB — FOLATE: Folate: 20.2 ng/mL (ref >5.9)

## 2016-05-19 MED ORDER — SODIUM CHLORIDE 0.9 % IV SOLN
20.0000 mg/m2 | Freq: Once | INTRAVENOUS | Status: AC
  Administered 2016-05-19: 50 mg via INTRAVENOUS
  Filled 2016-05-19: qty 50

## 2016-05-19 MED ORDER — SODIUM CHLORIDE 0.9 % IV SOLN
100.0000 mg/m2 | Freq: Once | INTRAVENOUS | Status: AC
  Administered 2016-05-19: 250 mg via INTRAVENOUS
  Filled 2016-05-19: qty 12.5

## 2016-05-19 MED ORDER — SODIUM CHLORIDE 0.9 % IV SOLN
Freq: Once | INTRAVENOUS | Status: AC
  Administered 2016-05-19: 12:00:00 via INTRAVENOUS
  Filled 2016-05-19: qty 5

## 2016-05-19 MED ORDER — POTASSIUM CHLORIDE 2 MEQ/ML IV SOLN
Freq: Once | INTRAVENOUS | Status: AC
  Administered 2016-05-19: 10:00:00 via INTRAVENOUS
  Filled 2016-05-19: qty 1000

## 2016-05-19 MED ORDER — SODIUM CHLORIDE 0.9 % IV SOLN
Freq: Once | INTRAVENOUS | Status: AC
  Administered 2016-05-19: 09:00:00 via INTRAVENOUS
  Filled 2016-05-19: qty 1000

## 2016-05-19 MED ORDER — PALONOSETRON HCL INJECTION 0.25 MG/5ML
0.2500 mg | Freq: Once | INTRAVENOUS | Status: AC
  Administered 2016-05-19: 0.25 mg via INTRAVENOUS
  Filled 2016-05-19: qty 5

## 2016-05-20 ENCOUNTER — Encounter: Payer: Self-pay | Admitting: *Deleted

## 2016-05-20 ENCOUNTER — Inpatient Hospital Stay: Payer: BLUE CROSS/BLUE SHIELD

## 2016-05-20 ENCOUNTER — Telehealth: Payer: Self-pay | Admitting: *Deleted

## 2016-05-20 ENCOUNTER — Other Ambulatory Visit: Payer: Self-pay | Admitting: *Deleted

## 2016-05-20 ENCOUNTER — Other Ambulatory Visit: Payer: Self-pay | Admitting: Hematology and Oncology

## 2016-05-20 VITALS — BP 157/83 | HR 76 | Temp 95.7°F | Resp 18

## 2016-05-20 DIAGNOSIS — C6211 Malignant neoplasm of descended right testis: Secondary | ICD-10-CM

## 2016-05-20 LAB — MAGNESIUM: Magnesium: 2.1 mg/dL (ref 1.7–2.4)

## 2016-05-20 MED ORDER — SODIUM CHLORIDE 0.9 % IV SOLN: 100.0000 mg/m2 | Freq: Once | INTRAVENOUS | Status: DC

## 2016-05-20 MED ORDER — SODIUM CHLORIDE 0.9 % IV SOLN
Freq: Once | INTRAVENOUS | Status: AC
  Administered 2016-05-20: 09:00:00 via INTRAVENOUS
  Filled 2016-05-20: qty 1000

## 2016-05-20 MED ORDER — SODIUM CHLORIDE 0.9 % IV SOLN
10.0000 mg | Freq: Once | INTRAVENOUS | Status: AC
  Administered 2016-05-20: 10 mg via INTRAVENOUS
  Filled 2016-05-20: qty 1

## 2016-05-20 MED ORDER — SODIUM CHLORIDE 0.9 % IV SOLN
20.0000 mg/m2 | Freq: Once | INTRAVENOUS | Status: AC
  Administered 2016-05-20: 50 mg via INTRAVENOUS
  Filled 2016-05-20: qty 50

## 2016-05-20 MED ORDER — POTASSIUM CHLORIDE 2 MEQ/ML IV SOLN
Freq: Once | INTRAVENOUS | Status: AC
  Administered 2016-05-20: 10:00:00 via INTRAVENOUS
  Filled 2016-05-20: qty 1000

## 2016-05-20 MED ORDER — SODIUM CHLORIDE 0.9 % IV SOLN
100.0000 mg/m2 | Freq: Once | INTRAVENOUS | Status: AC
  Administered 2016-05-20: 250 mg via INTRAVENOUS
  Filled 2016-05-20: qty 12.5

## 2016-05-20 MED ORDER — PROCHLORPERAZINE MALEATE 10 MG PO TABS: 10.0000 mg | ORAL_TABLET | Freq: Four times a day (QID) | ORAL | Status: DC | PRN

## 2016-05-20 NOTE — Telephone Encounter (Signed)
spoke about nausea med for home. Pt getting aloxi and Baldomero Lamy covers him for 48 hours and he is getting it every other day. We will send rx for compazine in case he gets nauseated at home.  Pt was told by infusion nurse nausea was being sent in.

## 2016-05-20 NOTE — Progress Notes (Unsigned)
Dr. [name] has noticed changes in ht with pt.  When I first checked in pt on 6/7 he stated he was 6 ft 1.6 in. Tall.Nathan Calderon He also said it is right because he is measured at work.  Then corcoran saw different versions of the ht from this office and other offices in epic system.  I physically checke dhis ht and wt today and it is entered into epic. i also checked with pharmacy on his chemo regimen and with the new changes it does not change his BSA or dose of chemo.

## 2016-05-21 ENCOUNTER — Inpatient Hospital Stay: Payer: BLUE CROSS/BLUE SHIELD

## 2016-05-21 ENCOUNTER — Other Ambulatory Visit: Payer: Self-pay | Admitting: Hematology and Oncology

## 2016-05-21 VITALS — BP 160/85 | HR 76 | Temp 96.2°F | Resp 18

## 2016-05-21 DIAGNOSIS — C6211 Malignant neoplasm of descended right testis: Secondary | ICD-10-CM | POA: Diagnosis not present

## 2016-05-21 MED ORDER — SODIUM CHLORIDE 0.9 % IV SOLN
100.0000 mg/m2 | Freq: Once | INTRAVENOUS | Status: AC
  Administered 2016-05-21: 250 mg via INTRAVENOUS
  Filled 2016-05-21: qty 12.5

## 2016-05-21 MED ORDER — PALONOSETRON HCL INJECTION 0.25 MG/5ML
0.2500 mg | Freq: Once | INTRAVENOUS | Status: AC
  Administered 2016-05-21: 0.25 mg via INTRAVENOUS
  Filled 2016-05-21: qty 5

## 2016-05-21 MED ORDER — SODIUM CHLORIDE 0.9 % IV SOLN
Freq: Once | INTRAVENOUS | Status: AC
  Administered 2016-05-21: 12:00:00 via INTRAVENOUS
  Filled 2016-05-21: qty 5

## 2016-05-21 MED ORDER — SODIUM CHLORIDE 0.9 % IV SOLN
Freq: Once | INTRAVENOUS | Status: AC
  Administered 2016-05-21: 09:00:00 via INTRAVENOUS
  Filled 2016-05-21: qty 1000

## 2016-05-21 MED ORDER — SODIUM CHLORIDE 0.9 % IV SOLN
20.0000 mg/m2 | Freq: Once | INTRAVENOUS | Status: AC
  Administered 2016-05-21: 50 mg via INTRAVENOUS
  Filled 2016-05-21: qty 50

## 2016-05-21 MED ORDER — SODIUM CHLORIDE 0.9 % IV SOLN: 100.0000 mg/m2 | Freq: Once | INTRAVENOUS | Status: DC

## 2016-05-21 MED ORDER — POTASSIUM CHLORIDE 2 MEQ/ML IV SOLN
Freq: Once | INTRAVENOUS | Status: AC
  Administered 2016-05-21: 09:00:00 via INTRAVENOUS
  Filled 2016-05-21: qty 1000

## 2016-05-23 ENCOUNTER — Other Ambulatory Visit: Payer: Self-pay | Admitting: Hematology and Oncology

## 2016-05-24 ENCOUNTER — Encounter: Payer: Self-pay | Admitting: Hematology and Oncology

## 2016-05-24 ENCOUNTER — Inpatient Hospital Stay: Payer: BLUE CROSS/BLUE SHIELD

## 2016-05-24 ENCOUNTER — Inpatient Hospital Stay (HOSPITAL_BASED_OUTPATIENT_CLINIC_OR_DEPARTMENT_OTHER): Payer: BLUE CROSS/BLUE SHIELD | Admitting: Hematology and Oncology

## 2016-05-24 VITALS — BP 158/81 | HR 83 | Temp 94.3°F | Resp 16 | Ht 73.0 in | Wt 250.4 lb

## 2016-05-24 DIAGNOSIS — D649 Anemia, unspecified: Secondary | ICD-10-CM | POA: Diagnosis not present

## 2016-05-24 DIAGNOSIS — Z79899 Other long term (current) drug therapy: Secondary | ICD-10-CM

## 2016-05-24 DIAGNOSIS — C6211 Malignant neoplasm of descended right testis: Secondary | ICD-10-CM

## 2016-05-24 DIAGNOSIS — R110 Nausea: Secondary | ICD-10-CM

## 2016-05-24 DIAGNOSIS — E871 Hypo-osmolality and hyponatremia: Secondary | ICD-10-CM

## 2016-05-24 DIAGNOSIS — Z9079 Acquired absence of other genital organ(s): Secondary | ICD-10-CM

## 2016-05-24 LAB — BASIC METABOLIC PANEL
Anion gap: 5 (ref 5–15)
BUN: 16 mg/dL (ref 6–20)
CO2: 29 mmol/L (ref 22–32)
Calcium: 8.8 mg/dL — ABNORMAL LOW (ref 8.9–10.3)
Chloride: 95 mmol/L — ABNORMAL LOW (ref 101–111)
Creatinine, Ser: 0.98 mg/dL (ref 0.61–1.24)
GFR calc Af Amer: >60 mL/min (ref >60)
GFR calc non Af Amer: >60 mL/min (ref >60)
Glucose, Bld: 319 mg/dL — ABNORMAL HIGH (ref 65–99)
Potassium: 4.3 mmol/L (ref 3.5–5.1)
Sodium: 129 mmol/L — ABNORMAL LOW (ref 135–145)

## 2016-05-24 LAB — CBC WITH DIFFERENTIAL/PLATELET
Basophils Absolute: 0 10*3/uL (ref 0–0.1)
Basophils Relative: 0 %
Eosinophils Absolute: 0.1 10*3/uL (ref 0–0.7)
Eosinophils Relative: 2 %
HCT: 37.8 % — ABNORMAL LOW (ref 40.0–52.0)
Hemoglobin: 13.6 g/dL (ref 13.0–18.0)
Lymphocytes Relative: 26 %
Lymphs Abs: 1.2 10*3/uL (ref 1.0–3.6)
MCH: 32.3 pg (ref 26.0–34.0)
MCHC: 35.9 g/dL (ref 32.0–36.0)
MCV: 90 fL (ref 80.0–100.0)
Monocytes Absolute: 0.1 10*3/uL — ABNORMAL LOW (ref 0.2–1.0)
Monocytes Relative: 1 %
Neutro Abs: 3.4 10*3/uL (ref 1.4–6.5)
Neutrophils Relative %: 71 %
Platelets: 242 10*3/uL (ref 150–440)
RBC: 4.2 MIL/uL — ABNORMAL LOW (ref 4.40–5.90)
RDW: 12.6 % (ref 11.5–14.5)
WBC: 4.8 10*3/uL (ref 3.8–10.6)

## 2016-05-24 LAB — MAGNESIUM: Magnesium: 1.7 mg/dL (ref 1.7–2.4)

## 2016-05-24 MED ORDER — BLEOMYCIN SULFATE CHEMO INJECTION 30 UNIT
30.0000 [IU] | Freq: Once | INTRAMUSCULAR | Status: AC
Start: 2016-05-24 — End: 2016-05-24
  Administered 2016-05-24: 30 [IU] via INTRAVENOUS
  Filled 2016-05-24: qty 10

## 2016-05-24 MED ORDER — ONDANSETRON HCL 8 MG PO TABS: 8.0000 mg | ORAL_TABLET | Freq: Three times a day (TID) | ORAL | Status: DC | PRN

## 2016-05-24 MED ORDER — SODIUM CHLORIDE 0.9 % IV SOLN
Freq: Once | INTRAVENOUS | Status: AC
  Administered 2016-05-24: 10:00:00 via INTRAVENOUS
  Filled 2016-05-24: qty 1000

## 2016-05-24 NOTE — Progress Notes (Signed)
No changes other nausea since the treatment.

## 2016-05-24 NOTE — Progress Notes (Signed)
[facility] day:  05/24/2016  Chief Complaint: Nathan Calderon is a 55 y.o. male with stage IB right testicular cancer who is seen for assessment prior to day 8 of cycle #1 bleomycin , etoposide, and cisplatin (BEP).  HPI:  The patient was last seen in the medical oncology clinic on 05/17/2016.  At that time, he began cycle #1 BEP.  He tolerated his chemotherapy well.  He notes a little nausea.  He was able to eat.  He notes that his blood sugar has been better.  He states that he saw his PCP.  He "doubled my pill and increased the insulin by 10 units".  He states that he is now "throttling down" on his medications.  Blood sugar was 107 yesterday.  He has been drinking a lot of plain water.   Past Medical History  Diagnosis Date  . Angina     2006, had cardiac cath at the time  . Diabetes mellitus   . Kidney stone     hx of  . Headache(784.0)   . Hypertension   . Hyperlipidemia   . Anxiety   . Sleep apnea   . Testicular cancer ([place]) 04/08/2016    Past Surgical History  Procedure Laterality Date  . Eye surgery      x 2  . Lasik    . Hernia repair      with undecended testicle  . Shoulder surgery      left shoulder  . Pars plana vitrectomy  04/04/2012    Procedure: PARS PLANA VITRECTOMY WITH 25 GAUGE;  Surgeon: [name], MD;  Location: [place];  Service: Ophthalmology;  Laterality: Left;  with laser  . Orchiectomy Right 04/08/2016    Procedure: ORCHIECTOMY/ INGUINAL APPROACH;  Surgeon: [name], MD;  Location: [facility];  Service: Urology;  Laterality: Right;  . Vasectomy Left 04/08/2016    Procedure: VASECTOMY;  Surgeon: [name], MD;  Location: [facility];  Service: Urology;  Laterality: Left;    Family History  Problem Relation Age of Onset  . Kidney disease Neg Hx   . Prostate cancer Neg Hx   . Diabetes Mother   . Heart disease Mother   . Hypertension Mother   . Heart disease Father   . Ovarian cancer Maternal  Grandmother     Social History:  reports that he has never smoked. He has never used smokeless tobacco. He reports that he drinks alcohol. He reports that he does not use illicit drugs.  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  The patient is alone today.  Allergies: No Known Allergies  Current Medications: Current Outpatient Prescriptions  Medication Sig Dispense Refill  . ALPRAZolam (XANAX) 0.5 MG tablet Take 0.5 mg by mouth every 6 (six) hours as needed. for anxiety  5  . amLODipine (NORVASC) 10 MG tablet Take 10 mg by mouth daily.  5  . aspirin EC 81 MG tablet Take 81 mg by mouth daily.    Marland Kitchen atorvastatin (LIPITOR) 20 MG tablet Take 20 mg by mouth daily.    Marland Kitchen BAYER CONTOUR NEXT TEST test strip TEST TWO TIMES DAILY  3  . glimepiride (AMARYL) 2 MG tablet TAKE 1 TABLET BY MOUTH DAILY WITH FIRST MEAL  11  .  Insulin Glargine (TOUJEO SOLOSTAR) 300 UNIT/ML SOPN Inject 65 Units into the skin every evening.    Marland Kitchen losartan (COZAAR) 100 MG tablet Take 100 mg by mouth daily.    . metFORMIN (GLUCOPHAGE) 1000 MG tablet Take 1,000 mg by mouth 2 (two) times daily with a meal.    . MUSE 250 MCG pellet Reported on 04/23/2016  1  . prochlorperazine (COMPAZINE) 10 MG tablet Take 1 tablet (10 mg total) by mouth every 6 (six) hours as needed for nausea or vomiting. 30 tablet 1  . docusate sodium (COLACE) 100 MG capsule Take 100 mg by mouth 2 (two) times daily. Reported on 05/24/2016  0  . [DISCONTINUED] hydrochlorothiazide (HYDRODIURIL) 50 MG tablet Take 50 mg by mouth daily.     No current facility-administered medications for this visit.    Review of Systems:  GENERAL:  Feels "fine".  No fever or sweats.  Weight loss of 7 pounds. PERFORMANCE STATUS (ECOG):  0 HEENT:  No visual changes, runny nose, sore throat, mouth sores or  tenderness. Lungs: No shortness of breath or cough.  No hemoptysis. Cardiac:  No chest pain, palpitations, orthopnea, or PND. GI:  Nausea, well controlled with Compazine.  No vomiting, diarrhea, constipation, melena or hematochezia. GU:  No urgency, frequency, dysuria, or hematuria. Musculoskeletal:  No back pain.  No joint pain.  No muscle tenderness. Extremities:  No pain or swelling. Skin:  No rashes or skin changes. Neuro:  No headache, numbness or weakness, balance or coordination issues. Endocrine:  Diabetes.  Blood sugar, improved.  No thyroid issues, hot flashes or night sweats. Psych:  No mood changes, depression or anxiety. Pain:  No focal pain. Review of systems:  All other systems reviewed and found to be negative.  Physical Exam: Blood pressure 158/81, pulse 83, temperature 94.3 F (34.6 C), temperature source Tympanic, resp. rate 16, height 6' 1"  (1.854 m), weight 250 lb 7.1 oz (113.6 kg). GENERAL:  Well developed, well nourished, gentleman sitting comfortably in the exam room in no acute distress. MENTAL STATUS:  Alert and oriented to person, place and time. HEAD:  Short hair.  Male pattern baldness. Graying beard.  Normocephalic, atraumatic, face symmetric, no Cushingoid features. EYES:  Glasses.  Brown eyes.  Pupils equal round and reactive to light and accomodation.  No conjunctivitis or scleral icterus. ENT:  Oropharynx clear without lesion.  Tongue normal. Mucous membranes moist.  RESPIRATORY:  Clear to auscultation without rales, wheezes or rhonchi. CARDIOVASCULAR:  Regular rate and rhythm without murmur, rub or gallop. ABDOMEN:  Ventral hernia.  Soft, non-tender, with active bowel sounds, and no hepatosplenomegaly.  No masses. SKIN:  No rashes, ulcers or lesions. EXTREMITIES: No edema, skin discoloration or tenderness.  No palpable cords. LYMPH NODES: No palpable cervical, supraclavicular, axillary or inguinal adenopathy  NEUROLOGICAL: Unremarkable. PSYCH:   Appropriate.  Appointment on 05/24/2016  Component Date Value Ref Range Status  . WBC 05/24/2016 4.8  3.8 - 10.6 K/uL Final  . RBC 05/24/2016 4.20* 4.40 - 5.90 MIL/uL Final  . Hemoglobin 05/24/2016 13.6  13.0 - 18.0 g/dL Final  . HCT 05/24/2016 37.8* 40.0 - 52.0 % Final  . MCV 05/24/2016 90.0  80.0 - 100.0 fL Final  . MCH 05/24/2016 32.3  26.0 - 34.0 pg Final  . MCHC 05/24/2016 35.9  32.0 - 36.0 g/dL Final  . RDW 05/24/2016 12.6  11.5 - 14.5 % Final  . Platelets 05/24/2016 242  150 - 440 K/uL Final  . Neutrophils Relative % 05/24/2016  71   Final  . Neutro Abs 05/24/2016 3.4  1.4 - 6.5 K/uL Final  . Lymphocytes Relative 05/24/2016 26   Final  . Lymphs Abs 05/24/2016 1.2  1.0 - 3.6 K/uL Final  . Monocytes Relative 05/24/2016 1   Final  . Monocytes Absolute 05/24/2016 0.1* 0.2 - 1.0 K/uL Final  . Eosinophils Relative 05/24/2016 2   Final  . Eosinophils Absolute 05/24/2016 0.1  0 - 0.7 K/uL Final  . Basophils Relative 05/24/2016 0   Final  . Basophils Absolute 05/24/2016 0.0  0 - 0.1 K/uL Final  . Sodium 05/24/2016 129* 135 - 145 mmol/L Final  . Potassium 05/24/2016 4.3  3.5 - 5.1 mmol/L Final  . Chloride 05/24/2016 95* 101 - 111 mmol/L Final  . CO2 05/24/2016 29  22 - 32 mmol/L Final  . Glucose, Bld 05/24/2016 319* 65 - 99 mg/dL Final  . BUN 05/24/2016 16  6 - 20 mg/dL Final  . Creatinine, Ser 05/24/2016 0.98  0.61 - 1.24 mg/dL Final  . Calcium 05/24/2016 8.8* 8.9 - 10.3 mg/dL Final  . GFR calc non Af Amer 05/24/2016 >60  >60 mL/min Final  . GFR calc Af Amer 05/24/2016 >60  >60 mL/min Final   Comment: (NOTE) The eGFR has been calculated using the CKD EPI equation. This calculation has not been validated in all clinical situations. eGFR's persistently <60 mL/min signify possible Chronic Kidney Disease.   . Anion gap 05/24/2016 5  5 - 15 Final  . Magnesium 05/24/2016 1.7  1.7 - 2.4 mg/dL Final    Assessment:  Nathan Calderon is a 55 y.o. male with stage IB right testicular cancer  s/p orchiectomy on 04/08/2016.  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  There was lymphovascular invasion. Pathologic stage was T2Nx (stage IB).    Abdominal and pelvic CT scan on 04/15/2016 and chest CT on 05/03/2016 revealed no evidence of metastatic disease.   Labs on 04/02/2016 revealed a normal AFP (2.2), beta-hCG (<1) and LDH (203).   PFTs on 05/13/2016 revealed FVC 3.82 liters (75%), FEV1 3.06 liters (84%), TLC 5.5  liters (77%), VC 4.00 liters (79%), and DLCO 28.6 ml/mmHg/min (93%).  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  He is currently day 8 of cycle #1 BEP (began 05/17/2016).  Nausea is well controlled with Compazine.  Symptomatically, he has some mild nausea.  He took Compazine this morning.  He has mild hyponatremia.  Plan: 1.  Labs today:  CBC with diff, BMP, Mg. 2.  Day 8 bleomycin today. 3.  Discuss management of nausea.  Patient plans to work this week.  Discuss use of ondansetron if nausea not controlled with Compazine or patient has trouble working with Compazine. 4.  Discuss hyponatremia.  Patient has been admitted in the past with hyponatremia.  Discuss fluids with electrolytes (Gatoraide),  Discuss aiding excess plain water. 5.  Discuss fever and neutropenia precautions. 6.  Rx:  ondansetron 8 mg po q 8 hours prn nausea. 7.  RTC on 06/28 for labs (CBC with diff, BMP) and +/- Neupogen. 8.  RTC in 1 week for MD assessment, labs (CBC with diff, BMP, Mg), and day 15 bleomycin.   Lequita Asal, MD  05/24/2016, 8:55 AM

## 2016-05-24 NOTE — Addendum Note (Signed)
Addended by: Luella Cook on: 05/24/2016 09:50 AM   Modules accepted: Orders

## 2016-05-26 ENCOUNTER — Encounter: Payer: Self-pay | Admitting: Hematology and Oncology

## 2016-05-26 ENCOUNTER — Other Ambulatory Visit: Payer: Self-pay | Admitting: Hematology and Oncology

## 2016-05-26 ENCOUNTER — Inpatient Hospital Stay: Payer: BLUE CROSS/BLUE SHIELD

## 2016-05-26 DIAGNOSIS — C6211 Malignant neoplasm of descended right testis: Secondary | ICD-10-CM

## 2016-05-26 DIAGNOSIS — R110 Nausea: Secondary | ICD-10-CM

## 2016-05-26 DIAGNOSIS — D702 Other drug-induced agranulocytosis: Secondary | ICD-10-CM | POA: Insufficient documentation

## 2016-05-26 DIAGNOSIS — E871 Hypo-osmolality and hyponatremia: Secondary | ICD-10-CM

## 2016-05-26 LAB — CBC WITH DIFFERENTIAL/PLATELET
Basophils Absolute: 0 10*3/uL (ref 0–0.1)
Basophils Relative: 2 %
Eosinophils Absolute: 0.1 10*3/uL (ref 0–0.7)
Eosinophils Relative: 4 %
HCT: 37 % — ABNORMAL LOW (ref 40.0–52.0)
Hemoglobin: 13.2 g/dL (ref 13.0–18.0)
Lymphocytes Relative: 57 %
Lymphs Abs: 1 10*3/uL (ref 1.0–3.6)
MCH: 32.1 pg (ref 26.0–34.0)
MCHC: 35.8 g/dL (ref 32.0–36.0)
MCV: 89.7 fL (ref 80.0–100.0)
Monocytes Absolute: 0 10*3/uL — ABNORMAL LOW (ref 0.2–1.0)
Monocytes Relative: 3 %
Neutro Abs: 0.6 10*3/uL — ABNORMAL LOW (ref 1.4–6.5)
Neutrophils Relative %: 34 %
Platelets: 160 10*3/uL (ref 150–440)
RBC: 4.12 MIL/uL — ABNORMAL LOW (ref 4.40–5.90)
RDW: 12.2 % (ref 11.5–14.5)
WBC: 1.8 10*3/uL — ABNORMAL LOW (ref 3.8–10.6)

## 2016-05-26 LAB — BASIC METABOLIC PANEL
Anion gap: 7 (ref 5–15)
BUN: 11 mg/dL (ref 6–20)
CO2: 26 mmol/L (ref 22–32)
Calcium: 8.7 mg/dL — ABNORMAL LOW (ref 8.9–10.3)
Chloride: 96 mmol/L — ABNORMAL LOW (ref 101–111)
Creatinine, Ser: 0.94 mg/dL (ref 0.61–1.24)
GFR calc Af Amer: >60 mL/min (ref >60)
GFR calc non Af Amer: >60 mL/min (ref >60)
Glucose, Bld: 271 mg/dL — ABNORMAL HIGH (ref 65–99)
Potassium: 4 mmol/L (ref 3.5–5.1)
Sodium: 129 mmol/L — ABNORMAL LOW (ref 135–145)

## 2016-05-26 MED ORDER — FILGRASTIM 480 MCG/0.8ML IJ SOSY
480.0000 ug | PREFILLED_SYRINGE | Freq: Every day | INTRAMUSCULAR | Status: DC
Start: 2016-05-26 — End: 2016-05-26

## 2016-05-26 MED ORDER — FILGRASTIM 480 MCG/0.8ML IJ SOSY: 480.0000 ug | PREFILLED_SYRINGE | Freq: Every day | INTRAMUSCULAR | Status: DC

## 2016-05-26 MED ORDER — TBO-FILGRASTIM 480 MCG/0.8ML ~~LOC~~ SOSY
480.0000 ug | PREFILLED_SYRINGE | Freq: Once | SUBCUTANEOUS | Status: AC
  Administered 2016-05-26: 480 ug via SUBCUTANEOUS
  Filled 2016-05-26: qty 0.8

## 2016-05-27 ENCOUNTER — Telehealth: Payer: Self-pay

## 2016-05-27 ENCOUNTER — Inpatient Hospital Stay: Payer: BLUE CROSS/BLUE SHIELD

## 2016-05-27 DIAGNOSIS — C6211 Malignant neoplasm of descended right testis: Secondary | ICD-10-CM | POA: Diagnosis not present

## 2016-05-27 DIAGNOSIS — D702 Other drug-induced agranulocytosis: Secondary | ICD-10-CM

## 2016-05-27 MED ORDER — TBO-FILGRASTIM 480 MCG/0.8ML ~~LOC~~ SOSY
480.0000 ug | PREFILLED_SYRINGE | Freq: Once | SUBCUTANEOUS | Status: AC
  Administered 2016-05-27: 480 ug via SUBCUTANEOUS
  Filled 2016-05-27: qty 0.8

## 2016-05-27 NOTE — Telephone Encounter (Signed)
Recived a message from pt that was yesterday concerning pts counts.  Pt verbalized that he called on call MD last night and per pt the On-call reported that he would not be comfortable letting pt go back to work without seeing his labs for today.  Pt decided he would stay home today also.   No other concerns noted.

## 2016-05-28 ENCOUNTER — Inpatient Hospital Stay: Payer: BLUE CROSS/BLUE SHIELD

## 2016-05-28 DIAGNOSIS — C6211 Malignant neoplasm of descended right testis: Secondary | ICD-10-CM

## 2016-05-28 DIAGNOSIS — D702 Other drug-induced agranulocytosis: Secondary | ICD-10-CM

## 2016-05-28 LAB — COMPREHENSIVE METABOLIC PANEL
ALT: 66 U/L — ABNORMAL HIGH (ref 17–63)
AST: 28 U/L (ref 15–41)
Albumin: 3.9 g/dL (ref 3.5–5.0)
Alkaline Phosphatase: 48 U/L (ref 38–126)
Anion gap: 6 (ref 5–15)
BUN: 12 mg/dL (ref 6–20)
CO2: 27 mmol/L (ref 22–32)
Calcium: 8.6 mg/dL — ABNORMAL LOW (ref 8.9–10.3)
Chloride: 98 mmol/L — ABNORMAL LOW (ref 101–111)
Creatinine, Ser: 0.87 mg/dL (ref 0.61–1.24)
GFR calc Af Amer: >60 mL/min (ref >60)
GFR calc non Af Amer: >60 mL/min (ref >60)
Glucose, Bld: 261 mg/dL — ABNORMAL HIGH (ref 65–99)
Potassium: 3.9 mmol/L (ref 3.5–5.1)
Sodium: 131 mmol/L — ABNORMAL LOW (ref 135–145)
Total Bilirubin: 0.5 mg/dL (ref 0.3–1.2)
Total Protein: 7.1 g/dL (ref 6.5–8.1)

## 2016-05-28 LAB — CBC WITH DIFFERENTIAL/PLATELET
Basophils Absolute: 0 10*3/uL (ref 0–0.1)
Basophils Relative: 2 %
Eosinophils Absolute: 0.1 10*3/uL (ref 0–0.7)
Eosinophils Relative: 3 %
HCT: 34.2 % — ABNORMAL LOW (ref 40.0–52.0)
Hemoglobin: 12.3 g/dL — ABNORMAL LOW (ref 13.0–18.0)
Lymphocytes Relative: 59 %
Lymphs Abs: 1.2 10*3/uL (ref 1.0–3.6)
MCH: 32.1 pg (ref 26.0–34.0)
MCHC: 35.9 g/dL (ref 32.0–36.0)
MCV: 89.6 fL (ref 80.0–100.0)
Monocytes Absolute: 0.1 10*3/uL — ABNORMAL LOW (ref 0.2–1.0)
Monocytes Relative: 4 %
Neutro Abs: 0.6 10*3/uL — ABNORMAL LOW (ref 1.4–6.5)
Neutrophils Relative %: 32 %
Platelets: 97 10*3/uL — ABNORMAL LOW (ref 150–440)
RBC: 3.82 MIL/uL — ABNORMAL LOW (ref 4.40–5.90)
RDW: 12.2 % (ref 11.5–14.5)
WBC: 2 10*3/uL — ABNORMAL LOW (ref 3.8–10.6)

## 2016-05-28 MED ORDER — TBO-FILGRASTIM 480 MCG/0.8ML ~~LOC~~ SOSY
480.0000 ug | PREFILLED_SYRINGE | Freq: Once | SUBCUTANEOUS | Status: AC
  Administered 2016-05-28: 480 ug via SUBCUTANEOUS
  Filled 2016-05-28: qty 0.8

## 2016-05-30 ENCOUNTER — Other Ambulatory Visit: Payer: Self-pay | Admitting: Hematology and Oncology

## 2016-05-31 ENCOUNTER — Inpatient Hospital Stay: Payer: BLUE CROSS/BLUE SHIELD | Attending: Hematology and Oncology | Admitting: Hematology and Oncology

## 2016-05-31 ENCOUNTER — Telehealth: Payer: Self-pay

## 2016-05-31 ENCOUNTER — Encounter: Payer: Self-pay | Admitting: Hematology and Oncology

## 2016-05-31 ENCOUNTER — Inpatient Hospital Stay: Payer: BLUE CROSS/BLUE SHIELD

## 2016-05-31 VITALS — BP 154/79 | HR 84 | Temp 97.8°F | Resp 18 | Wt 250.2 lb

## 2016-05-31 DIAGNOSIS — D702 Other drug-induced agranulocytosis: Secondary | ICD-10-CM | POA: Diagnosis not present

## 2016-05-31 DIAGNOSIS — C6291 Malignant neoplasm of right testis, unspecified whether descended or undescended: Secondary | ICD-10-CM | POA: Diagnosis present

## 2016-05-31 DIAGNOSIS — E119 Type 2 diabetes mellitus without complications: Secondary | ICD-10-CM | POA: Diagnosis not present

## 2016-05-31 DIAGNOSIS — Z7982 Long term (current) use of aspirin: Secondary | ICD-10-CM | POA: Insufficient documentation

## 2016-05-31 DIAGNOSIS — H905 Unspecified sensorineural hearing loss: Secondary | ICD-10-CM | POA: Insufficient documentation

## 2016-05-31 DIAGNOSIS — R112 Nausea with vomiting, unspecified: Secondary | ICD-10-CM | POA: Diagnosis not present

## 2016-05-31 DIAGNOSIS — R110 Nausea: Secondary | ICD-10-CM

## 2016-05-31 DIAGNOSIS — C6211 Malignant neoplasm of descended right testis: Secondary | ICD-10-CM

## 2016-05-31 DIAGNOSIS — E785 Hyperlipidemia, unspecified: Secondary | ICD-10-CM | POA: Insufficient documentation

## 2016-05-31 DIAGNOSIS — I10 Essential (primary) hypertension: Secondary | ICD-10-CM | POA: Insufficient documentation

## 2016-05-31 DIAGNOSIS — R252 Cramp and spasm: Secondary | ICD-10-CM | POA: Diagnosis not present

## 2016-05-31 DIAGNOSIS — E871 Hypo-osmolality and hyponatremia: Secondary | ICD-10-CM

## 2016-05-31 DIAGNOSIS — K5900 Constipation, unspecified: Secondary | ICD-10-CM | POA: Insufficient documentation

## 2016-05-31 DIAGNOSIS — Z79899 Other long term (current) drug therapy: Secondary | ICD-10-CM | POA: Diagnosis not present

## 2016-05-31 DIAGNOSIS — Z87442 Personal history of urinary calculi: Secondary | ICD-10-CM | POA: Diagnosis not present

## 2016-05-31 DIAGNOSIS — D696 Thrombocytopenia, unspecified: Secondary | ICD-10-CM

## 2016-05-31 DIAGNOSIS — Z794 Long term (current) use of insulin: Secondary | ICD-10-CM | POA: Diagnosis not present

## 2016-05-31 DIAGNOSIS — G4730 Sleep apnea, unspecified: Secondary | ICD-10-CM | POA: Diagnosis not present

## 2016-05-31 DIAGNOSIS — R740 Nonspecific elevation of levels of transaminase and lactic acid dehydrogenase [LDH]: Secondary | ICD-10-CM | POA: Diagnosis not present

## 2016-05-31 DIAGNOSIS — Z7984 Long term (current) use of oral hypoglycemic drugs: Secondary | ICD-10-CM | POA: Insufficient documentation

## 2016-05-31 DIAGNOSIS — Z5111 Encounter for antineoplastic chemotherapy: Secondary | ICD-10-CM | POA: Insufficient documentation

## 2016-05-31 DIAGNOSIS — F419 Anxiety disorder, unspecified: Secondary | ICD-10-CM | POA: Diagnosis not present

## 2016-05-31 DIAGNOSIS — Z7689 Persons encountering health services in other specified circumstances: Secondary | ICD-10-CM | POA: Diagnosis not present

## 2016-05-31 LAB — CBC WITH DIFFERENTIAL/PLATELET
Basophils Absolute: 0 10*3/uL (ref 0–0.1)
Basophils Relative: 2 %
Eosinophils Absolute: 0.1 10*3/uL (ref 0–0.7)
Eosinophils Relative: 5 %
HCT: 32 % — ABNORMAL LOW (ref 40.0–52.0)
Hemoglobin: 11.8 g/dL — ABNORMAL LOW (ref 13.0–18.0)
Lymphocytes Relative: 65 %
Lymphs Abs: 0.9 10*3/uL — ABNORMAL LOW (ref 1.0–3.6)
MCH: 32.6 pg (ref 26.0–34.0)
MCHC: 37 g/dL — ABNORMAL HIGH (ref 32.0–36.0)
MCV: 88.2 fL (ref 80.0–100.0)
Monocytes Absolute: 0.2 10*3/uL (ref 0.2–1.0)
Monocytes Relative: 13 %
Neutro Abs: 0.2 10*3/uL — ABNORMAL LOW (ref 1.4–6.5)
Neutrophils Relative %: 15 %
Platelets: 82 10*3/uL — ABNORMAL LOW (ref 150–440)
RBC: 3.62 MIL/uL — ABNORMAL LOW (ref 4.40–5.90)
RDW: 12.2 % (ref 11.5–14.5)
WBC: 1.4 10*3/uL — CL (ref 3.8–10.6)

## 2016-05-31 LAB — BASIC METABOLIC PANEL
Anion gap: 6 (ref 5–15)
BUN: 9 mg/dL (ref 6–20)
CO2: 28 mmol/L (ref 22–32)
Calcium: 8.7 mg/dL — ABNORMAL LOW (ref 8.9–10.3)
Chloride: 98 mmol/L — ABNORMAL LOW (ref 101–111)
Creatinine, Ser: 0.74 mg/dL (ref 0.61–1.24)
GFR calc Af Amer: >60 mL/min (ref >60)
GFR calc non Af Amer: >60 mL/min (ref >60)
Glucose, Bld: 299 mg/dL — ABNORMAL HIGH (ref 65–99)
Potassium: 4 mmol/L (ref 3.5–5.1)
Sodium: 132 mmol/L — ABNORMAL LOW (ref 135–145)

## 2016-05-31 LAB — MAGNESIUM: Magnesium: 1.6 mg/dL — ABNORMAL LOW (ref 1.7–2.4)

## 2016-05-31 MED ORDER — MAGNESIUM SULFATE 2 GM/50ML IV SOLN
2.0000 g | Freq: Once | INTRAVENOUS | Status: AC
  Administered 2016-05-31: 2 g via INTRAVENOUS
  Filled 2016-05-31: qty 50

## 2016-05-31 MED ORDER — SODIUM CHLORIDE 0.9 % IV SOLN
30.0000 [IU] | Freq: Once | INTRAVENOUS | Status: AC
  Administered 2016-05-31: 30 [IU] via INTRAVENOUS
  Filled 2016-05-31: qty 10

## 2016-05-31 MED ORDER — SODIUM CHLORIDE 0.9 % IV SOLN
Freq: Once | INTRAVENOUS | Status: AC
  Administered 2016-05-31: 10:00:00 via INTRAVENOUS
  Filled 2016-05-31: qty 1000

## 2016-05-31 NOTE — Telephone Encounter (Signed)
Critical ANC called by Lanelle Bal of 0.2 and a white count of 1.4.  MD aware.

## 2016-05-31 NOTE — Progress Notes (Signed)
N=0.2, plt =82.  MD to proceed with treatment of bleomycin today.

## 2016-05-31 NOTE — Progress Notes (Signed)
[facility] day:  05/31/2016  Chief Complaint: Nathan Calderon is a 55 y.o. male with stage IB right testicular cancer who is seen for assessment prior to day 15 of cycle #1 bleomycin , etoposide, and cisplatin (BEP).  HPI:  The patient was last seen in the medical oncology clinic on 05/24/2016.  At that time, he began received day 8 bleomycin.  He tolerated his chemotherapy well.  He felt better than the week before.  Counts included a WBC of 4800 and an ANC of 3400.  He had follow-up counts on 05/28/2016.  WBC was 1800 with an ANC of 600.  He received Granix 480 mcg x 3 days.  Counts on 05/28/2016 revealed an [place] of 600 and platelet count of 97,000.  He stats that he was off work last week.  He works with coolant with bacteria.  Symptomatically, he has loose stool once in awhile.  He is drinking fluids with electrolytes ("stock in Gatorade).  He switched from taking Compazine to ondansetron.  His nausea is well controlled.  He took ondansetron this morning.  Blood sugar has been better.   Past Medical History  Diagnosis Date  . Angina     2006, had cardiac cath at the time  . Diabetes mellitus   . Kidney stone     hx of  . Headache(784.0)   . Hypertension   . Hyperlipidemia   . Anxiety   . Sleep apnea   . Testicular cancer ([place]) 04/08/2016    Past Surgical History  Procedure Laterality Date  . Eye surgery      x 2  . Lasik    . Hernia repair      with undecended testicle  . Shoulder surgery      left shoulder  . Pars plana vitrectomy  04/04/2012    Procedure: PARS PLANA VITRECTOMY WITH 25 GAUGE;  Surgeon: [name], MD;  Location: [place];  Service: Ophthalmology;  Laterality: Left;  with laser  . Orchiectomy Right 04/08/2016    Procedure: ORCHIECTOMY/ INGUINAL APPROACH;  Surgeon: [name], MD;  Location: [facility];  Service: Urology;  Laterality: Right;  . Vasectomy Left 04/08/2016    Procedure: VASECTOMY;  Surgeon: [name], MD;  Location: [facility];  Service: Urology;  Laterality: Left;    Family History  Problem Relation Age of Onset  . Kidney disease Neg Hx   . Prostate cancer Neg Hx   . Diabetes Mother   . Heart disease Mother   . Hypertension Mother   . Heart disease Father   . Ovarian cancer Maternal Grandmother     Social History:  reports that he has never smoked. He has never used smokeless tobacco. He reports that he drinks alcohol. He reports that he does not use illicit drugs.  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  The patient is accompanied by his wife..  Allergies: No Known Allergies  Current Medications: Current Outpatient Prescriptions  Medication Sig Dispense Refill  . ALPRAZolam (XANAX) 0.5 MG tablet Take 0.5 mg by mouth every 6 (six) hours as needed. for anxiety  5  . amLODipine (NORVASC) 10 MG tablet Take 10 mg by mouth daily.  5  . aspirin EC 81 MG tablet Take  81 mg by mouth daily.    Marland Kitchen atorvastatin (LIPITOR) 20 MG tablet Take 20 mg by mouth daily.    Marland Kitchen BAYER CONTOUR NEXT TEST test strip TEST TWO TIMES DAILY  3  . docusate sodium (COLACE) 100 MG capsule Take 100 mg by mouth 2 (two) times daily. Reported on 05/24/2016  0  . glimepiride (AMARYL) 2 MG tablet TAKE 1 TABLET BY MOUTH DAILY WITH FIRST MEAL  11  . Insulin Glargine (TOUJEO SOLOSTAR) 300 UNIT/ML SOPN Inject 65 Units into the skin every evening.    Marland Kitchen losartan (COZAAR) 100 MG tablet Take 100 mg by mouth daily.    . metFORMIN (GLUCOPHAGE) 1000 MG tablet Take 1,000 mg by mouth 2 (two) times daily with a meal.    . MUSE 250 MCG pellet Reported on 04/23/2016  1  . ondansetron (ZOFRAN) 8 MG tablet Take 1 tablet (8 mg total) by mouth every 8 (eight) hours as needed for nausea or vomiting. 20 tablet 0  . prochlorperazine (COMPAZINE) 10 MG tablet Take 1  tablet (10 mg total) by mouth every 6 (six) hours as needed for nausea or vomiting. 30 tablet 1  . [DISCONTINUED] hydrochlorothiazide (HYDRODIURIL) 50 MG tablet Take 50 mg by mouth daily.     No current facility-administered medications for this visit.    Review of Systems:  GENERAL:  Feels "fine".  No fever or sweats.  Weight stable. PERFORMANCE STATUS (ECOG):  0 HEENT:  No visual changes, runny nose, sore throat, mouth sores or tenderness. Lungs: No shortness of breath or cough.  No hemoptysis. Cardiac:  No chest pain, palpitations, orthopnea, or PND. GI:  Nausea, well controlled with ondansetron.  Little loose stool.  No vomiting, diarrhea, constipation, melena or hematochezia. GU:  No urgency, frequency, dysuria, or hematuria. Musculoskeletal:  No back pain.  No joint pain.  No muscle tenderness. Extremities:  No pain or swelling. Skin:  No rashes or skin changes. Neuro:  No headache, numbness or weakness, balance or coordination issues. Endocrine:  Diabetes.  Blood sugar, improved.  No thyroid issues, hot flashes or night sweats. Psych:  No mood changes, depression or anxiety. Pain:  No focal pain. Review of systems:  All other systems reviewed and found to be negative.  Physical Exam: Blood pressure 154/79, pulse 84, temperature 97.8 F (36.6 C), temperature source Tympanic, resp. rate 18, weight 250 lb 3.6 oz (113.5 kg). GENERAL:  Well developed, well nourished, gentleman sitting comfortably in the exam room in no acute distress. MENTAL STATUS:  Alert and oriented to person, place and time. HEAD:  Short hair.  Male pattern baldness. Graying beard.  Normocephalic, atraumatic, face symmetric, no Cushingoid features. EYES:  Glasses.  Brown eyes.  Pupils equal round and reactive to light and accomodation.  No conjunctivitis or scleral icterus. ENT:  Oropharynx clear without lesion.  Tongue normal. Mucous membranes moist.  RESPIRATORY:  Clear to auscultation without rales, wheezes or  rhonchi. CARDIOVASCULAR:  Regular rate and rhythm without murmur, rub or gallop. ABDOMEN:  Ventral hernia.  Soft, non-tender, with active bowel sounds, and no hepatosplenomegaly.  No masses. SKIN:  No rashes, ulcers or lesions. EXTREMITIES: No edema, skin discoloration or tenderness.  No palpable cords. LYMPH NODES: No palpable cervical, supraclavicular, axillary or inguinal adenopathy  NEUROLOGICAL: Unremarkable. PSYCH:  Appropriate.  Appointment on 05/31/2016  Component Date Value Ref Range Status  . WBC 05/31/2016 1.4* 3.8 - 10.6 K/uL Final   Comment: RESULT REPEATED AND VERIFIED [facility]ENTER CRITICAL VALUE PROTOCOL   .  RBC 05/31/2016 3.62* 4.40 - 5.90 MIL/uL Final  . Hemoglobin 05/31/2016 11.8* 13.0 - 18.0 g/dL Final   RESULT REPEATED AND VERIFIED  . HCT 05/31/2016 32.0* 40.0 - 52.0 % Final   RESULT REPEATED AND VERIFIED  . MCV 05/31/2016 88.2  80.0 - 100.0 fL Final  . MCH 05/31/2016 32.6  26.0 - 34.0 pg Final  . MCHC 05/31/2016 37.0* 32.0 - 36.0 g/dL Final  . RDW 05/31/2016 12.2  11.5 - 14.5 % Final  . Platelets 05/31/2016 82* 150 - 440 K/uL Final  . Neutrophils Relative % 05/31/2016 15%   Final  . Neutro Abs 05/31/2016 0.2* 1.4 - 6.5 K/uL Final   Comment: RESULT REPEATED AND VERIFIED CRITICAL RESULT CALLED TO, READ BACK BY AND VERIFIED WITH: NYO TO BRANDY MOYA 0850 05/31/16   . Lymphocytes Relative 05/31/2016 65%   Final  . Lymphs Abs 05/31/2016 0.9* 1.0 - 3.6 K/uL Final  . Monocytes Relative 05/31/2016 13%   Final  . Monocytes Absolute 05/31/2016 0.2  0.2 - 1.0 K/uL Final  . Eosinophils Relative 05/31/2016 5%   Final  . Eosinophils Absolute 05/31/2016 0.1  0 - 0.7 K/uL Final  . Basophils Relative 05/31/2016 2%   Final  . Basophils Absolute 05/31/2016 0.0  0 - 0.1 K/uL Final  . Sodium 05/31/2016 132* 135 - 145 mmol/L Final  . Potassium 05/31/2016 4.0  3.5 - 5.1 mmol/L Final  . Chloride 05/31/2016 98* 101 - 111 mmol/L Final  . CO2 05/31/2016 28  22 - 32 mmol/L Final  .  Glucose, Bld 05/31/2016 299* 65 - 99 mg/dL Final  . BUN 05/31/2016 9  6 - 20 mg/dL Final  . Creatinine, Ser 05/31/2016 0.74  0.61 - 1.24 mg/dL Final  . Calcium 05/31/2016 8.7* 8.9 - 10.3 mg/dL Final  . GFR calc non Af Amer 05/31/2016 >60  >60 mL/min Final  . GFR calc Af Amer 05/31/2016 >60  >60 mL/min Final   Comment: (NOTE) The eGFR has been calculated using the CKD EPI equation. This calculation has not been validated in all clinical situations. eGFR's persistently <60 mL/min signify possible Chronic Kidney Disease.   . Anion gap 05/31/2016 6  5 - 15 Final  . Magnesium 05/31/2016 1.6* 1.7 - 2.4 mg/dL Final    Assessment:  Nathan Calderon is a 55 y.o. male with stage IB right testicular cancer s/p orchiectomy on 04/08/2016.  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  There was lymphovascular invasion. Pathologic stage was T2Nx (stage IB).    Abdominal and pelvic CT scan on 04/15/2016 and chest CT on 05/03/2016 revealed no evidence of metastatic disease.   Labs on 04/02/2016 revealed a normal AFP (2.2), beta-hCG (<1) and LDH (203).   PFTs on 05/13/2016 revealed FVC 3.82 liters (75%), FEV1 3.06 liters (84%), TLC 5.5  liters (77%), VC 4.00 liters (79%), and DLCO 28.6 ml/mmHg/min (93%).  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  He is currently day 15 of cycle #1 BEP (began 05/17/2016).  He became neutropenic on day 10.  He received 3 days of Granix.  Nausea is well controlled with ondansetron.  Symptomatically, he has mild nausea controlled with ondansetron.  ANC is 200 and platelet count 82,000.  He has mild hyponatremia and hypomagnesemia.  Plan: 1.  Labs today:  CBC with diff, BMP, Mg. 2.  Day 15 bleomycin today. 3.  Magnesium 2 gm IV  today. 4.  Continue fluids with electrolytes. 5.  Discuss neutropenic precautions.  Discuss continuation of Granix this week  until [place] > 5000.. 6.  Review fever and neutropenia precautions. 7.  RTC on 06/02/2016 for CBC with diff +/- Granix. 8.  RTC on 07/06 and 07/07 for Granix. 9.  RTC on 06/07/2016 for MD assessment, labs (CBC with diff, CMP, Mg, AFP, beta HCG, LDH), and day 1 of cycle #2 BEP.   Lequita Asal, MD  05/31/2016, 9:13 AM

## 2016-05-31 NOTE — Progress Notes (Signed)
Patient is here for follow up appointment

## 2016-06-02 ENCOUNTER — Telehealth: Payer: Self-pay

## 2016-06-02 ENCOUNTER — Inpatient Hospital Stay: Payer: BLUE CROSS/BLUE SHIELD

## 2016-06-02 DIAGNOSIS — D702 Other drug-induced agranulocytosis: Secondary | ICD-10-CM

## 2016-06-02 DIAGNOSIS — D696 Thrombocytopenia, unspecified: Secondary | ICD-10-CM | POA: Insufficient documentation

## 2016-06-02 DIAGNOSIS — C6291 Malignant neoplasm of right testis, unspecified whether descended or undescended: Secondary | ICD-10-CM | POA: Diagnosis not present

## 2016-06-02 DIAGNOSIS — C6211 Malignant neoplasm of descended right testis: Secondary | ICD-10-CM

## 2016-06-02 DIAGNOSIS — E871 Hypo-osmolality and hyponatremia: Secondary | ICD-10-CM | POA: Insufficient documentation

## 2016-06-02 LAB — CBC WITH DIFFERENTIAL/PLATELET
Basophils Absolute: 0 10*3/uL (ref 0–0.1)
Basophils Relative: 1 %
Eosinophils Absolute: 0.1 10*3/uL (ref 0–0.7)
Eosinophils Relative: 4 %
HCT: 30.5 % — ABNORMAL LOW (ref 40.0–52.0)
Hemoglobin: 11.1 g/dL — ABNORMAL LOW (ref 13.0–18.0)
Lymphocytes Relative: 59 %
Lymphs Abs: 1 10*3/uL (ref 1.0–3.6)
MCH: 32.1 pg (ref 26.0–34.0)
MCHC: 36.2 g/dL — ABNORMAL HIGH (ref 32.0–36.0)
MCV: 88.7 fL (ref 80.0–100.0)
Monocytes Absolute: 0.2 10*3/uL (ref 0.2–1.0)
Monocytes Relative: 12 %
Neutro Abs: 0.4 10*3/uL — ABNORMAL LOW (ref 1.7–7.7)
Neutrophils Relative %: 24 %
Platelets: 136 10*3/uL — ABNORMAL LOW (ref 150–440)
RBC: 3.44 MIL/uL — ABNORMAL LOW (ref 4.40–5.90)
RDW: 12.1 % (ref 11.5–14.5)
WBC: 1.8 10*3/uL — ABNORMAL LOW (ref 3.8–10.6)

## 2016-06-02 MED ORDER — TBO-FILGRASTIM 480 MCG/0.8ML ~~LOC~~ SOSY
480.0000 ug | PREFILLED_SYRINGE | Freq: Once | SUBCUTANEOUS | Status: AC
  Administered 2016-06-02: 480 ug via SUBCUTANEOUS
  Filled 2016-06-02: qty 0.8

## 2016-06-02 NOTE — Telephone Encounter (Signed)
Critical ANC called By Erline Levine RN of 0.2 at 15:30.  MD aware

## 2016-06-02 NOTE — Telephone Encounter (Signed)
Patient has critical [place] results of 0.4.  Dr. [name] notified at 1528 on 06/02/16.  Patient to receive Granix injection today.

## 2016-06-03 ENCOUNTER — Inpatient Hospital Stay: Payer: BLUE CROSS/BLUE SHIELD

## 2016-06-03 ENCOUNTER — Encounter: Payer: Self-pay | Admitting: Hematology and Oncology

## 2016-06-03 ENCOUNTER — Inpatient Hospital Stay (HOSPITAL_BASED_OUTPATIENT_CLINIC_OR_DEPARTMENT_OTHER): Payer: BLUE CROSS/BLUE SHIELD | Admitting: Hematology and Oncology

## 2016-06-03 VITALS — BP 138/83 | HR 91 | Temp 97.6°F | Resp 18

## 2016-06-03 DIAGNOSIS — R252 Cramp and spasm: Secondary | ICD-10-CM

## 2016-06-03 DIAGNOSIS — R110 Nausea: Secondary | ICD-10-CM

## 2016-06-03 DIAGNOSIS — E119 Type 2 diabetes mellitus without complications: Secondary | ICD-10-CM

## 2016-06-03 DIAGNOSIS — C6291 Malignant neoplasm of right testis, unspecified whether descended or undescended: Secondary | ICD-10-CM

## 2016-06-03 DIAGNOSIS — M62830 Muscle spasm of back: Secondary | ICD-10-CM

## 2016-06-03 DIAGNOSIS — F419 Anxiety disorder, unspecified: Secondary | ICD-10-CM

## 2016-06-03 DIAGNOSIS — I10 Essential (primary) hypertension: Secondary | ICD-10-CM

## 2016-06-03 DIAGNOSIS — E785 Hyperlipidemia, unspecified: Secondary | ICD-10-CM

## 2016-06-03 DIAGNOSIS — D702 Other drug-induced agranulocytosis: Secondary | ICD-10-CM | POA: Diagnosis not present

## 2016-06-03 DIAGNOSIS — C6211 Malignant neoplasm of descended right testis: Secondary | ICD-10-CM

## 2016-06-03 DIAGNOSIS — E871 Hypo-osmolality and hyponatremia: Secondary | ICD-10-CM

## 2016-06-03 DIAGNOSIS — H905 Unspecified sensorineural hearing loss: Secondary | ICD-10-CM

## 2016-06-03 DIAGNOSIS — Z7982 Long term (current) use of aspirin: Secondary | ICD-10-CM

## 2016-06-03 DIAGNOSIS — Z7689 Persons encountering health services in other specified circumstances: Secondary | ICD-10-CM

## 2016-06-03 DIAGNOSIS — Z7984 Long term (current) use of oral hypoglycemic drugs: Secondary | ICD-10-CM

## 2016-06-03 DIAGNOSIS — Z79899 Other long term (current) drug therapy: Secondary | ICD-10-CM

## 2016-06-03 DIAGNOSIS — G4730 Sleep apnea, unspecified: Secondary | ICD-10-CM

## 2016-06-03 DIAGNOSIS — Z794 Long term (current) use of insulin: Secondary | ICD-10-CM

## 2016-06-03 DIAGNOSIS — Z87442 Personal history of urinary calculi: Secondary | ICD-10-CM

## 2016-06-03 MED ORDER — TBO-FILGRASTIM 480 MCG/0.8ML ~~LOC~~ SOSY
480.0000 ug | PREFILLED_SYRINGE | Freq: Once | SUBCUTANEOUS | Status: AC
  Administered 2016-06-03: 480 ug via SUBCUTANEOUS
  Filled 2016-06-03: qty 0.8

## 2016-06-03 NOTE — Progress Notes (Signed)
Patient is here for Granix injection today and has concerns of new back spasms with chest discomfort/spasm.  He will have a back spasm that feels like it radiates to the chest area or has the chest area discomfort with cough.  Was not able to go to work today due to the spasms.

## 2016-06-04 ENCOUNTER — Other Ambulatory Visit: Payer: Self-pay | Admitting: Hematology and Oncology

## 2016-06-04 ENCOUNTER — Inpatient Hospital Stay: Payer: BLUE CROSS/BLUE SHIELD

## 2016-06-04 DIAGNOSIS — C6291 Malignant neoplasm of right testis, unspecified whether descended or undescended: Secondary | ICD-10-CM | POA: Diagnosis not present

## 2016-06-04 DIAGNOSIS — D702 Other drug-induced agranulocytosis: Secondary | ICD-10-CM

## 2016-06-04 MED ORDER — TBO-FILGRASTIM 480 MCG/0.8ML ~~LOC~~ SOSY
480.0000 ug | PREFILLED_SYRINGE | Freq: Once | SUBCUTANEOUS | Status: AC
  Administered 2016-06-04: 480 ug via SUBCUTANEOUS
  Filled 2016-06-04: qty 0.8

## 2016-06-07 ENCOUNTER — Encounter: Payer: Self-pay | Admitting: Hematology and Oncology

## 2016-06-07 ENCOUNTER — Inpatient Hospital Stay: Payer: BLUE CROSS/BLUE SHIELD

## 2016-06-07 ENCOUNTER — Inpatient Hospital Stay (HOSPITAL_BASED_OUTPATIENT_CLINIC_OR_DEPARTMENT_OTHER): Payer: BLUE CROSS/BLUE SHIELD | Admitting: Hematology and Oncology

## 2016-06-07 ENCOUNTER — Other Ambulatory Visit: Payer: Self-pay | Admitting: Hematology and Oncology

## 2016-06-07 VITALS — BP 138/78 | HR 90 | Resp 20

## 2016-06-07 VITALS — BP 154/81 | HR 92 | Temp 95.8°F | Wt 246.5 lb

## 2016-06-07 DIAGNOSIS — E119 Type 2 diabetes mellitus without complications: Secondary | ICD-10-CM

## 2016-06-07 DIAGNOSIS — R252 Cramp and spasm: Secondary | ICD-10-CM

## 2016-06-07 DIAGNOSIS — I10 Essential (primary) hypertension: Secondary | ICD-10-CM

## 2016-06-07 DIAGNOSIS — D702 Other drug-induced agranulocytosis: Secondary | ICD-10-CM

## 2016-06-07 DIAGNOSIS — E871 Hypo-osmolality and hyponatremia: Secondary | ICD-10-CM

## 2016-06-07 DIAGNOSIS — C6211 Malignant neoplasm of descended right testis: Secondary | ICD-10-CM

## 2016-06-07 DIAGNOSIS — Z7689 Persons encountering health services in other specified circumstances: Secondary | ICD-10-CM

## 2016-06-07 DIAGNOSIS — Z87442 Personal history of urinary calculi: Secondary | ICD-10-CM

## 2016-06-07 DIAGNOSIS — F419 Anxiety disorder, unspecified: Secondary | ICD-10-CM

## 2016-06-07 DIAGNOSIS — C6291 Malignant neoplasm of right testis, unspecified whether descended or undescended: Secondary | ICD-10-CM | POA: Diagnosis not present

## 2016-06-07 DIAGNOSIS — R110 Nausea: Secondary | ICD-10-CM

## 2016-06-07 DIAGNOSIS — H905 Unspecified sensorineural hearing loss: Secondary | ICD-10-CM

## 2016-06-07 DIAGNOSIS — Z794 Long term (current) use of insulin: Secondary | ICD-10-CM

## 2016-06-07 DIAGNOSIS — G4730 Sleep apnea, unspecified: Secondary | ICD-10-CM

## 2016-06-07 DIAGNOSIS — Z79899 Other long term (current) drug therapy: Secondary | ICD-10-CM

## 2016-06-07 DIAGNOSIS — E785 Hyperlipidemia, unspecified: Secondary | ICD-10-CM

## 2016-06-07 LAB — CBC WITH DIFFERENTIAL/PLATELET
Basophils Absolute: 0 10*3/uL (ref 0–0.1)
Basophils Relative: 0 %
Eosinophils Absolute: 0.1 10*3/uL (ref 0–0.7)
Eosinophils Relative: 1 %
HCT: 35.2 % — ABNORMAL LOW (ref 40.0–52.0)
Hemoglobin: 12.8 g/dL — ABNORMAL LOW (ref 13.0–18.0)
Lymphocytes Relative: 20 %
Lymphs Abs: 2.7 10*3/uL (ref 1.0–3.6)
MCH: 32.4 pg (ref 26.0–34.0)
MCHC: 36.4 g/dL — ABNORMAL HIGH (ref 32.0–36.0)
MCV: 89 fL (ref 80.0–100.0)
Monocytes Absolute: 1.1 10*3/uL — ABNORMAL HIGH (ref 0.2–1.0)
Monocytes Relative: 9 %
Neutro Abs: 9.3 10*3/uL — ABNORMAL HIGH (ref 1.4–6.5)
Neutrophils Relative %: 70 %
Platelets: 191 10*3/uL (ref 150–440)
RBC: 3.96 MIL/uL — ABNORMAL LOW (ref 4.40–5.90)
RDW: 12.1 % (ref 11.5–14.5)
WBC: 13.2 10*3/uL — ABNORMAL HIGH (ref 3.8–10.6)

## 2016-06-07 LAB — COMPREHENSIVE METABOLIC PANEL
ALT: 35 U/L (ref 17–63)
AST: 22 U/L (ref 15–41)
Albumin: 3.8 g/dL (ref 3.5–5.0)
Alkaline Phosphatase: 130 U/L — ABNORMAL HIGH (ref 38–126)
Anion gap: 9 (ref 5–15)
BUN: 10 mg/dL (ref 6–20)
CO2: 26 mmol/L (ref 22–32)
Calcium: 8.7 mg/dL — ABNORMAL LOW (ref 8.9–10.3)
Chloride: 96 mmol/L — ABNORMAL LOW (ref 101–111)
Creatinine, Ser: 0.99 mg/dL (ref 0.61–1.24)
GFR calc Af Amer: >60 mL/min (ref >60)
GFR calc non Af Amer: >60 mL/min (ref >60)
Glucose, Bld: 352 mg/dL — ABNORMAL HIGH (ref 65–99)
Potassium: 3.8 mmol/L (ref 3.5–5.1)
Sodium: 131 mmol/L — ABNORMAL LOW (ref 135–145)
Total Bilirubin: 0.5 mg/dL (ref 0.3–1.2)
Total Protein: 7.1 g/dL (ref 6.5–8.1)

## 2016-06-07 LAB — LACTATE DEHYDROGENASE: LDH: 450 U/L — ABNORMAL HIGH (ref 98–192)

## 2016-06-07 LAB — MAGNESIUM: Magnesium: 1.5 mg/dL — ABNORMAL LOW (ref 1.7–2.4)

## 2016-06-07 MED ORDER — SODIUM CHLORIDE 0.9 % IV SOLN
Freq: Once | INTRAVENOUS | Status: AC
  Administered 2016-06-07: 10:00:00 via INTRAVENOUS
  Filled 2016-06-07: qty 1000

## 2016-06-07 MED ORDER — SODIUM CHLORIDE 0.9 % IV SOLN
100.0000 mg/m2 | Freq: Once | INTRAVENOUS | Status: AC
  Administered 2016-06-07: 250 mg via INTRAVENOUS
  Filled 2016-06-07: qty 12.5

## 2016-06-07 MED ORDER — PALONOSETRON HCL INJECTION 0.25 MG/5ML
0.2500 mg | Freq: Once | INTRAVENOUS | Status: AC
  Administered 2016-06-07: 0.25 mg via INTRAVENOUS
  Filled 2016-06-07: qty 5

## 2016-06-07 MED ORDER — CISPLATIN CHEMO INJECTION 100MG/100ML
20.0000 mg/m2 | Freq: Once | INTRAVENOUS | Status: AC
  Administered 2016-06-07: 50 mg via INTRAVENOUS
  Filled 2016-06-07: qty 50

## 2016-06-07 MED ORDER — MAGNESIUM SULFATE 2 GM/50ML IV SOLN
2.0000 g | Freq: Once | INTRAVENOUS | Status: AC
  Administered 2016-06-07: 2 g via INTRAVENOUS
  Filled 2016-06-07: qty 50

## 2016-06-07 MED ORDER — SODIUM CHLORIDE 0.9 % IV SOLN
30.0000 [IU] | Freq: Once | INTRAVENOUS | Status: AC
  Administered 2016-06-07: 30 [IU] via INTRAVENOUS
  Filled 2016-06-07: qty 10

## 2016-06-07 MED ORDER — FOSAPREPITANT DIMEGLUMINE INJECTION 150 MG
Freq: Once | INTRAVENOUS | Status: AC
  Administered 2016-06-07: 13:00:00 via INTRAVENOUS
  Filled 2016-06-07: qty 5

## 2016-06-07 MED ORDER — POTASSIUM CHLORIDE 2 MEQ/ML IV SOLN
Freq: Once | INTRAVENOUS | Status: AC
  Administered 2016-06-07: 11:00:00 via INTRAVENOUS
  Filled 2016-06-07: qty 1000

## 2016-06-07 NOTE — Progress Notes (Signed)
[facility] day:  06/03/2016  Chief Complaint: Nathan Calderon is a 55 y.o. male with stage IB right testicular cancer who is seen for sick call visit on day 18 of cycle #1 bleomycin , etoposide, and cisplatin (BEP).  HPI:  The patient was last seen in the medical oncology clinic on 05/31/2016.  At that time, he received day 15 bleomycin.  He had mild nausea controlled with ondansetron.  ANC was 200 with a platelet count of 82,000.  He had mild hyponatremia and hypomagnesemia.  He received Granix.  Counts on 06/02/2016 included an ANC of 400 and platelets 136,000.  He received Granix.  He came in for day 6 Granix today and noted significant back spasms.  He has a tickle in his throat.  If he coughs, he has back pain with radiation.  He denies any shortness of breath or pleuritic chest pain.  He denies any pain radiating to his jaw or arm.  He denies any urinary symptoms.   Past Medical History  Diagnosis Date  . Angina     2006, had cardiac cath at the time  . Diabetes mellitus   . Kidney stone     hx of  . Headache(784.0)   . Hypertension   . Hyperlipidemia   . Anxiety   . Sleep apnea   . Testicular cancer ([place]) 04/08/2016    Past Surgical History  Procedure Laterality Date  . Eye surgery      x 2  . Lasik    . Hernia repair      with undecended testicle  . Shoulder surgery      left shoulder  . Pars plana vitrectomy  04/04/2012    Procedure: PARS PLANA VITRECTOMY WITH 25 GAUGE;  Surgeon: [name], MD;  Location: [place];  Service: Ophthalmology;  Laterality: Left;  with laser  . Orchiectomy Right 04/08/2016    Procedure: ORCHIECTOMY/ INGUINAL APPROACH;  Surgeon: [name], MD;  Location: [facility];  Service: Urology;  Laterality: Right;  . Vasectomy Left 04/08/2016    Procedure: VASECTOMY;  Surgeon: [name], MD;  Location: [facility];  Service: Urology;  Laterality: Left;    Family History  Problem Relation Age of  Onset  . Kidney disease Neg Hx   . Prostate cancer Neg Hx   . Diabetes Mother   . Heart disease Mother   . Hypertension Mother   . Heart disease Father   . Ovarian cancer Maternal Grandmother     Social History:  reports that he has never smoked. He has never used smokeless tobacco. He reports that he drinks alcohol. He reports that he does not use illicit drugs.  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  The patient is alone today.  Allergies: No Known Allergies  Current Medications: Current Outpatient Prescriptions  Medication Sig Dispense Refill  . ALPRAZolam (XANAX) 0.5 MG tablet Take 0.5 mg by mouth every 6 (six) hours as needed. for anxiety  5  . amLODipine (NORVASC) 10 MG tablet Take 10 mg by mouth daily.  5  . aspirin EC 81 MG tablet Take 81 mg by mouth daily.    Marland Kitchen atorvastatin (LIPITOR) 20 MG tablet Take 20 mg by mouth daily.    Marland Kitchen  BAYER CONTOUR NEXT TEST test strip TEST TWO TIMES DAILY  3  . docusate sodium (COLACE) 100 MG capsule Take 100 mg by mouth 2 (two) times daily. Reported on 05/24/2016  0  . glimepiride (AMARYL) 2 MG tablet TAKE 1 TABLET BY MOUTH DAILY WITH FIRST MEAL  11  . Insulin Glargine (TOUJEO SOLOSTAR) 300 UNIT/ML SOPN Inject 65 Units into the skin every evening.    Marland Kitchen losartan (COZAAR) 100 MG tablet Take 100 mg by mouth daily.    . metFORMIN (GLUCOPHAGE) 1000 MG tablet Take 1,000 mg by mouth 2 (two) times daily with a meal.    . MUSE 250 MCG pellet Reported on 04/23/2016  1  . ondansetron (ZOFRAN) 8 MG tablet Take 1 tablet (8 mg total) by mouth every 8 (eight) hours as needed for nausea or vomiting. 20 tablet 0  . prochlorperazine (COMPAZINE) 10 MG tablet Take 1 tablet (10 mg total) by mouth every 6 (six) hours as needed for nausea or vomiting. 30 tablet 1  . [DISCONTINUED]  hydrochlorothiazide (HYDRODIURIL) 50 MG tablet Take 50 mg by mouth daily.     No current facility-administered medications for this visit.    Review of Systems:  GENERAL:  Feels "ok".  No fever or sweats.  No new weight. PERFORMANCE STATUS (ECOG):  0 HEENT:  Tickle in throat.  No visual changes, runny nose, sore throat, mouth sores or tenderness. Lungs: No shortness of breath or cough.  No hemoptysis. Cardiac:  No chest pain, palpitations, orthopnea, or PND. GI:  Nausea, well controlled.  No vomiting, diarrhea, constipation, melena or hematochezia. GU:  No urgency, frequency, dysuria, or hematuria. Musculoskeletal:  Back pain/spasm.  No joint pain.  No muscle tenderness. Extremities:  No pain or swelling. Skin:  No rashes or skin changes. Neuro:  No headache, numbness or weakness, balance or coordination issues. Endocrine:  Diabetes.  Blood sugar, better.  No thyroid issues, hot flashes or night sweats. Psych:  No mood changes, depression or anxiety. Pain:  No focal pain. Review of systems:  All other systems reviewed and found to be negative.  Physical Exam: Blood pressure 138/83, pulse 91, temperature 97.6 F (36.4 C), temperature source Tympanic, resp. rate 18. GENERAL:  Well developed, well nourished, gentleman sitting comfortably in the exam room in no acute distress. MENTAL STATUS:  Alert and oriented to person, place and time. HEAD:  Short hair.  Male pattern baldness. Graying beard.  Normocephalic, atraumatic, face symmetric, no Cushingoid features. EYES:  Glasses.  Brown eyes.  No conjunctivitis or scleral icterus. RESPIRATORY:  Clear to auscultation without rales, wheezes or rhonchi. CARDIOVASCULAR:  Regular rate and rhythm without murmur, rub or gallop. ABDOMEN:  Soft, non-tender, with active bowel sounds, and no hepatosplenomegaly.  No masses. BACK:  No CVA tenderness.  Discomfort localizes to lower lumbar area. SKIN:  No rashes, ulcers or lesions. EXTREMITIES: No edema,  skin discoloration or tenderness.  No palpable cords. NEUROLOGICAL: Unremarkable. PSYCH:  Appropriate.  Appointment on 06/02/2016  Component Date Value Ref Range Status  . WBC 06/02/2016 1.8* 3.8 - 10.6 K/uL Final  . RBC 06/02/2016 3.44* 4.40 - 5.90 MIL/uL Final  . Hemoglobin 06/02/2016 11.1* 13.0 - 18.0 g/dL Final  . HCT 06/02/2016 30.5* 40.0 - 52.0 % Final  . MCV 06/02/2016 88.7  80.0 - 100.0 fL Final  . MCH 06/02/2016 32.1  26.0 - 34.0 pg Final  . MCHC 06/02/2016 36.2* 32.0 - 36.0 g/dL Final  . RDW 06/02/2016 12.1  11.5 - 14.5 %  Final  . Platelets 06/02/2016 136* 150 - 440 K/uL Final  . Neutrophils Relative % 06/02/2016 24   Final  . Neutro Abs 06/02/2016 0.4* 1.7 - 7.7 K/uL Final   Comment: RESULT REPEATED AND VERIFIED CRITICAL RESULT CALLED TO, READ BACK BY AND VERIFIED WITH: NYO TO STACEY MCDONALD 06/02/16 1520   . Lymphocytes Relative 06/02/2016 59   Final  . Lymphs Abs 06/02/2016 1.0  1.0 - 3.6 K/uL Final  . Monocytes Relative 06/02/2016 12   Final  . Monocytes Absolute 06/02/2016 0.2  0.2 - 1.0 K/uL Final  . Eosinophils Relative 06/02/2016 4   Final  . Eosinophils Absolute 06/02/2016 0.1  0 - 0.7 K/uL Final  . Basophils Relative 06/02/2016 1   Final  . Basophils Absolute 06/02/2016 0.0  0 - 0.1 K/uL Final    Assessment:  Nathan Calderon is a 55 y.o. male with stage IB right testicular cancer s/p orchiectomy on 04/08/2016.  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  There was lymphovascular invasion. Pathologic stage was T2Nx (stage IB).    Abdominal and pelvic CT scan on 04/15/2016 and chest CT on 05/03/2016 revealed no evidence of metastatic disease.   Labs on 04/02/2016 revealed a normal AFP (2.2), beta-hCG (<1) and LDH (203).   PFTs on 05/13/2016 revealed FVC 3.82 liters (75%), FEV1 3.06 liters (84%), TLC 5.5  liters (77%), VC 4.00 liters (79%), and DLCO 28.6 ml/mmHg/min (93%).  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  He is currently day 18 of cycle #1 BEP (began 05/17/2016).  He became neutropenic on day 10.  He has received 6 days of Granix.  Nausea is well controlled with  ondansetron.  Symptomatically, he has back spasms likely related to Granix.  Plan: 1.  Discuss use of Claritin. 2.  Symptomatic relief with Tylenol.  Try heating pad. 3.  Continue neutropenic precautions. 4.  RTC on 06/07/2016 for MD assessment, labs (CBC with diff, CMP, Mg, AFP, beta HCG, LDH), and day 1 of cycle #2 BEP.   Lequita Asal, MD  06/07/2016, 3:35 AM

## 2016-06-07 NOTE — Progress Notes (Signed)
[facility] day:  06/07/2016  Chief Complaint: Nathan Calderon is a 55 y.o. male with stage IB right testicular cancer who is seen for assessment prior to cycle #2 bleomycin , etoposide, and cisplatin (BEP).  HPI:  The patient was last seen in the medical oncology clinic on 06/03/2016.  At that time, he was seen for a sick call visit secondary to lower back spasms likely due to Granix.  Claritin was recommended as well as Tylenol prn and a heating pad.  During the interim, he has done well.  His back spasms have resolved.  Claritin helped.  He notes slight nausea.  He feels a little fatigued.   Past Medical History  Diagnosis Date  . Angina     2006, had cardiac cath at the time  . Diabetes mellitus   . Kidney stone     hx of  . Headache(784.0)   . Hypertension   . Hyperlipidemia   . Anxiety   . Sleep apnea   . Testicular cancer ([place]) 04/08/2016    Past Surgical History  Procedure Laterality Date  . Eye surgery      x 2  . Lasik    . Hernia repair      with undecended testicle  . Shoulder surgery      left shoulder  . Pars plana vitrectomy  04/04/2012    Procedure: PARS PLANA VITRECTOMY WITH 25 GAUGE;  Surgeon: [name], MD;  Location: [facility];  Service: Ophthalmology;  Laterality: Left;  with laser  . Orchiectomy Right 04/08/2016    Procedure: ORCHIECTOMY/ INGUINAL APPROACH;  Surgeon: [name], MD;  Location: [facility];  Service: Urology;  Laterality: Right;  . Vasectomy Left 04/08/2016    Procedure: VASECTOMY;  Surgeon: [name], MD;  Location: [facility];  Service: Urology;  Laterality: Left;    Family History  Problem Relation Age of Onset  . Kidney disease Neg Hx   . Prostate cancer Neg Hx   . Diabetes Mother   . Heart disease Mother   . Hypertension Mother   . Heart disease Father   . Ovarian cancer Maternal Grandmother     Social History:  reports that he has never smoked. He has never used smokeless  tobacco. He reports that he drinks alcohol. He reports that he does not use illicit drugs.  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  The patient is alone today.  Allergies: No Known Allergies  Current Medications: Current Outpatient Prescriptions  Medication Sig Dispense Refill  . ALPRAZolam (XANAX) 0.5 MG tablet Take 0.5 mg by mouth every 6 (six) hours as needed. for anxiety  5  . amLODipine (NORVASC) 10 MG tablet Take 10 mg by mouth daily.  5  . aspirin EC 81 MG tablet Take 81 mg by mouth daily.    Marland Kitchen atorvastatin (LIPITOR) 20 MG tablet Take 20 mg by mouth daily.    Marland Kitchen BAYER CONTOUR NEXT TEST test strip TEST TWO TIMES DAILY  3  . docusate sodium (COLACE) 100 MG capsule Take 100 mg by mouth 2 (two) times daily. Reported on 05/24/2016  0  . glimepiride (AMARYL) 2 MG tablet TAKE 1 TABLET BY MOUTH DAILY WITH FIRST MEAL  11  . Insulin Glargine (  TOUJEO SOLOSTAR) 300 UNIT/ML SOPN Inject 65 Units into the skin every evening.    . loratadine (CLARITIN) 10 MG tablet Take 10 mg by mouth daily.    Marland Kitchen losartan (COZAAR) 100 MG tablet Take 100 mg by mouth daily.    . metFORMIN (GLUCOPHAGE) 1000 MG tablet Take 1,000 mg by mouth 2 (two) times daily with a meal.    . MUSE 250 MCG pellet Reported on 04/23/2016  1  . ondansetron (ZOFRAN) 8 MG tablet Take 1 tablet (8 mg total) by mouth every 8 (eight) hours as needed for nausea or vomiting. 20 tablet 0  . prochlorperazine (COMPAZINE) 10 MG tablet Take 1 tablet (10 mg total) by mouth every 6 (six) hours as needed for nausea or vomiting. 30 tablet 1  . [DISCONTINUED] hydrochlorothiazide (HYDRODIURIL) 50 MG tablet Take 50 mg by mouth daily.     No current facility-administered medications for this visit.    Review of Systems:  GENERAL:  Feels a little fatigued (stable).  No fever or sweats.   Weigh down 4 pounds. PERFORMANCE STATUS (ECOG):  0 HEENT:  No visual changes, runny nose, sore throat, mouth sores or tenderness. Lungs: No shortness of breath or cough.  No hemoptysis. Cardiac:  No chest pain, palpitations, orthopnea, or PND. GI:  Nausea, minimal.  No vomiting, diarrhea, constipation, melena or hematochezia. GU:  No urgency, frequency, dysuria, or hematuria. Musculoskeletal:  No back pain.  No joint pain.  No muscle tenderness. Extremities:  No pain or swelling. Skin:  No rashes or skin changes. Neuro:  No headache, numbness or weakness, balance or coordination issues. Endocrine:  Diabetes.  No thyroid issues, hot flashes or night sweats. Psych:  No mood changes, depression or anxiety. Pain:  No focal pain. Review of systems:  All other systems reviewed and found to be negative.  Physical Exam: Blood pressure 154/81, pulse 92, temperature 95.8 F (35.4 C), temperature source Tympanic, weight 246 lb 7.6 oz (111.8 kg). GENERAL:  Well developed, well nourished, gentleman sitting comfortably in the exam room in no acute distress. MENTAL STATUS:  Alert and oriented to person, place and time. HEAD:  Short hair.  Male pattern baldness. Graying beard.  Normocephalic, atraumatic, face symmetric, no Cushingoid features. EYES:  Glasses.  Brown eyes.  No conjunctivitis or scleral icterus. RESPIRATORY:  Clear to auscultation without rales, wheezes or rhonchi. CARDIOVASCULAR:  Regular rate and rhythm without murmur, rub or gallop. ABDOMEN:  Soft, non-tender, with active bowel sounds, and no hepatosplenomegaly.  No masses. SKIN:  No rashes, ulcers or lesions. EXTREMITIES: No edema, skin discoloration or tenderness.  No palpable cords. NEUROLOGICAL: Unremarkable. PSYCH:  Appropriate.  Appointment on 06/07/2016  Component Date Value Ref Range Status  . WBC 06/07/2016 13.2* 3.8 - 10.6 K/uL Final  . RBC 06/07/2016 3.96* 4.40 - 5.90 MIL/uL Final  . Hemoglobin 06/07/2016 12.8* 13.0 -  18.0 g/dL Final   RESULT REPEATED AND VERIFIED  . HCT 06/07/2016 35.2* 40.0 - 52.0 % Final   RESULT REPEATED AND VERIFIED  . MCV 06/07/2016 89.0  80.0 - 100.0 fL Final  . MCH 06/07/2016 32.4  26.0 - 34.0 pg Final  . MCHC 06/07/2016 36.4* 32.0 - 36.0 g/dL Final  . RDW 06/07/2016 12.1  11.5 - 14.5 % Final  . Platelets 06/07/2016 191  150 - 440 K/uL Final  . Neutrophils Relative % 06/07/2016 70%   Final  . Neutro Abs 06/07/2016 9.3* 1.4 - 6.5 K/uL Final  . Lymphocytes Relative 06/07/2016 20%  Final  . Lymphs Abs 06/07/2016 2.7  1.0 - 3.6 K/uL Final  . Monocytes Relative 06/07/2016 9%   Final  . Monocytes Absolute 06/07/2016 1.1* 0.2 - 1.0 K/uL Final  . Eosinophils Relative 06/07/2016 1%   Final  . Eosinophils Absolute 06/07/2016 0.1  0 - 0.7 K/uL Final  . Basophils Relative 06/07/2016 0%   Final  . Basophils Absolute 06/07/2016 0.0  0 - 0.1 K/uL Final  . Sodium 06/07/2016 131* 135 - 145 mmol/L Final  . Potassium 06/07/2016 3.8  3.5 - 5.1 mmol/L Final  . Chloride 06/07/2016 96* 101 - 111 mmol/L Final  . CO2 06/07/2016 26  22 - 32 mmol/L Final  . Glucose, Bld 06/07/2016 352* 65 - 99 mg/dL Final  . BUN 06/07/2016 10  6 - 20 mg/dL Final  . Creatinine, Ser 06/07/2016 0.99  0.61 - 1.24 mg/dL Final  . Calcium 06/07/2016 8.7* 8.9 - 10.3 mg/dL Final  . Total Protein 06/07/2016 7.1  6.5 - 8.1 g/dL Final  . Albumin 06/07/2016 3.8  3.5 - 5.0 g/dL Final  . AST 06/07/2016 22  15 - 41 U/L Final  . ALT 06/07/2016 35  17 - 63 U/L Final  . Alkaline Phosphatase 06/07/2016 130* 38 - 126 U/L Final  . Total Bilirubin 06/07/2016 0.5  0.3 - 1.2 mg/dL Final  . GFR calc non Af Amer 06/07/2016 >60  >60 mL/min Final  . GFR calc Af Amer 06/07/2016 >60  >60 mL/min Final   Comment: (NOTE) The eGFR has been calculated using the CKD EPI equation. This calculation has not been validated in all clinical situations. eGFR's persistently <60 mL/min signify possible Chronic Kidney Disease.   . Anion gap 06/07/2016 9   5 - 15 Final  . Magnesium 06/07/2016 1.5* 1.7 - 2.4 mg/dL Final  . LDH 06/07/2016 450* 98 - 192 U/L Final    Assessment:  SHAYNE DIGUGLIELMO is a 55 y.o. male with stage IB right testicular cancer s/p orchiectomy on 04/08/2016.  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  There was lymphovascular invasion. Pathologic stage was T2Nx (stage IB).    Abdominal and pelvic CT scan on 04/15/2016 and chest CT on 05/03/2016 revealed no evidence of metastatic disease.   Labs on 04/02/2016 revealed a normal AFP (2.2), beta-hCG (<1) and LDH (203).   PFTs on 05/13/2016 revealed FVC 3.82 liters (75%), FEV1 3.06 liters (84%), TLC 5.5  liters (77%), VC 4.00 liters (79%), and DLCO 28.6 ml/mmHg/min (93%).  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  He is currently day 1 of cycle #2 BEP (began 05/17/2016).  He became neutropenic on day 10 of cycle #1.  He has received 6 days of Granix.  Nausea is well controlled with  ondansetron.  Symptomatically, he has slight fatigue and mild nausea.  He has lost 4 pounds.  Blood sugar is elevated.  He has mild hyponatremia and hypomagnesemia.  Plan: 1.  Labs today:  CBC with diff, CMP, Mg, AFP, beta HCG, LDH. 2.  Begin cycle #2 BEP (Monday-Friday this week). 3.  Magnesium 2 gm IV today. 4.  Discuss elevated blood sugar.  Patient to monitor closely and follow-up with PCP. 5.  Discuss elevated LDH.  Etiology unclear.  Follow-up markers (beta-HCG and AFP).  Repeat LDH. 6.  Discuss low magnesium and plan for IV magnesium today.  Recheck magnesium later in week. 7.  Discuss  plan for On-Pro Neulasta on 06/11/2016 secondary to neutropenia associated with cycle #1. 8.  RTC on 06/14/2016 for MD assessment, labs (CBC with diff, CMP, Mg), and day 8 of cycle #2 BEP.   Lequita Asal, MD  06/07/2016, 9:19 AM

## 2016-06-07 NOTE — Progress Notes (Signed)
Pt here for possible chemotherapy. Feels mildly nauseated this morning. Legs feel achy. He feels tired, fatigued. Appetite has been fair. Patient has lost 4 lbs since last visit. He states the claritin did help with the pain he was having from his granix shots last week.

## 2016-06-08 ENCOUNTER — Inpatient Hospital Stay: Payer: BLUE CROSS/BLUE SHIELD

## 2016-06-08 ENCOUNTER — Other Ambulatory Visit: Payer: Self-pay | Admitting: Hematology and Oncology

## 2016-06-08 VITALS — BP 177/67 | HR 120 | Temp 95.9°F | Resp 18

## 2016-06-08 DIAGNOSIS — R740 Nonspecific elevation of levels of transaminase and lactic acid dehydrogenase [LDH]: Secondary | ICD-10-CM

## 2016-06-08 DIAGNOSIS — C6211 Malignant neoplasm of descended right testis: Secondary | ICD-10-CM

## 2016-06-08 DIAGNOSIS — C6291 Malignant neoplasm of right testis, unspecified whether descended or undescended: Secondary | ICD-10-CM | POA: Diagnosis not present

## 2016-06-08 DIAGNOSIS — R7402 Elevation of levels of lactic acid dehydrogenase (LDH): Secondary | ICD-10-CM

## 2016-06-08 DIAGNOSIS — I2510 Atherosclerotic heart disease of native coronary artery without angina pectoris: Secondary | ICD-10-CM | POA: Insufficient documentation

## 2016-06-08 LAB — LACTATE DEHYDROGENASE: LDH: 393 U/L — ABNORMAL HIGH (ref 98–192)

## 2016-06-08 LAB — TROPONIN I: Troponin I: 0.1 ng/mL (ref <0.03)

## 2016-06-08 LAB — CK: Total CK: 50 U/L (ref 49–397)

## 2016-06-08 LAB — BETA HCG QUANT (REF LAB): Beta hCG, Tumor Marker: <1 m[IU]/mL (ref 0–3)

## 2016-06-08 LAB — AFP TUMOR MARKER: AFP-Tumor Marker: 4.9 ng/mL (ref 0.0–8.3)

## 2016-06-08 MED ORDER — SODIUM CHLORIDE 0.9 % IV SOLN
Freq: Once | INTRAVENOUS | Status: AC
  Administered 2016-06-08: 09:00:00 via INTRAVENOUS
  Filled 2016-06-08: qty 1000

## 2016-06-08 MED ORDER — SODIUM CHLORIDE 0.9 % IV SOLN
10.0000 mg | Freq: Once | INTRAVENOUS | Status: AC
  Administered 2016-06-08: 10 mg via INTRAVENOUS
  Filled 2016-06-08: qty 1

## 2016-06-08 MED ORDER — SODIUM CHLORIDE 0.9 % IV SOLN
20.0000 mg/m2 | Freq: Once | INTRAVENOUS | Status: AC
  Administered 2016-06-08: 50 mg via INTRAVENOUS
  Filled 2016-06-08: qty 50

## 2016-06-08 MED ORDER — SODIUM CHLORIDE 0.9 % IV SOLN
100.0000 mg/m2 | Freq: Once | INTRAVENOUS | Status: AC
  Administered 2016-06-08: 250 mg via INTRAVENOUS
  Filled 2016-06-08: qty 12.5

## 2016-06-08 MED ORDER — POTASSIUM CHLORIDE 2 MEQ/ML IV SOLN
Freq: Once | INTRAVENOUS | Status: AC
  Administered 2016-06-08: 10:00:00 via INTRAVENOUS
  Filled 2016-06-08: qty 10

## 2016-06-09 ENCOUNTER — Inpatient Hospital Stay: Payer: BLUE CROSS/BLUE SHIELD

## 2016-06-09 ENCOUNTER — Other Ambulatory Visit: Payer: Self-pay | Admitting: Hematology and Oncology

## 2016-06-09 VITALS — BP 149/75 | HR 86 | Temp 96.2°F | Resp 18

## 2016-06-09 DIAGNOSIS — C6291 Malignant neoplasm of right testis, unspecified whether descended or undescended: Secondary | ICD-10-CM | POA: Diagnosis not present

## 2016-06-09 DIAGNOSIS — C6211 Malignant neoplasm of descended right testis: Secondary | ICD-10-CM

## 2016-06-09 MED ORDER — HEPARIN SOD (PORK) LOCK FLUSH 100 UNIT/ML IV SOLN: 500.0000 [IU] | Freq: Once | INTRAVENOUS | Status: DC

## 2016-06-09 MED ORDER — SODIUM CHLORIDE 0.9 % IV SOLN
100.0000 mg/m2 | Freq: Once | INTRAVENOUS | Status: AC
  Administered 2016-06-09: 250 mg via INTRAVENOUS
  Filled 2016-06-09: qty 12.5

## 2016-06-09 MED ORDER — POTASSIUM CHLORIDE 2 MEQ/ML IV SOLN
Freq: Once | INTRAVENOUS | Status: AC
  Administered 2016-06-09: 09:00:00 via INTRAVENOUS
  Filled 2016-06-09: qty 10

## 2016-06-09 MED ORDER — PALONOSETRON HCL INJECTION 0.25 MG/5ML
0.2500 mg | Freq: Once | INTRAVENOUS | Status: AC
  Administered 2016-06-09: 0.25 mg via INTRAVENOUS
  Filled 2016-06-09: qty 5

## 2016-06-09 MED ORDER — SODIUM CHLORIDE 0.9 % IV SOLN
Freq: Once | INTRAVENOUS | Status: AC
  Administered 2016-06-09: 11:00:00 via INTRAVENOUS
  Filled 2016-06-09: qty 5

## 2016-06-09 MED ORDER — SODIUM CHLORIDE 0.9 % IV SOLN
Freq: Once | INTRAVENOUS | Status: AC
  Administered 2016-06-09: 09:00:00 via INTRAVENOUS
  Filled 2016-06-09: qty 1000

## 2016-06-09 MED ORDER — SODIUM CHLORIDE 0.9 % IV SOLN
20.0000 mg/m2 | Freq: Once | INTRAVENOUS | Status: AC
  Administered 2016-06-09: 50 mg via INTRAVENOUS
  Filled 2016-06-09: qty 50

## 2016-06-10 ENCOUNTER — Inpatient Hospital Stay: Payer: BLUE CROSS/BLUE SHIELD

## 2016-06-10 ENCOUNTER — Telehealth: Payer: Self-pay | Admitting: *Deleted

## 2016-06-10 ENCOUNTER — Other Ambulatory Visit: Payer: Self-pay | Admitting: Hematology and Oncology

## 2016-06-10 VITALS — BP 177/78 | HR 87 | Temp 95.1°F | Resp 18

## 2016-06-10 DIAGNOSIS — C6291 Malignant neoplasm of right testis, unspecified whether descended or undescended: Secondary | ICD-10-CM | POA: Diagnosis not present

## 2016-06-10 DIAGNOSIS — C6211 Malignant neoplasm of descended right testis: Secondary | ICD-10-CM

## 2016-06-10 DIAGNOSIS — D702 Other drug-induced agranulocytosis: Secondary | ICD-10-CM

## 2016-06-10 DIAGNOSIS — E871 Hypo-osmolality and hyponatremia: Secondary | ICD-10-CM

## 2016-06-10 LAB — COMPREHENSIVE METABOLIC PANEL
ALT: 22 U/L (ref 17–63)
AST: 19 U/L (ref 15–41)
Albumin: 3.7 g/dL (ref 3.5–5.0)
Alkaline Phosphatase: 87 U/L (ref 38–126)
Anion gap: 6 (ref 5–15)
BUN: 18 mg/dL (ref 6–20)
CO2: 25 mmol/L (ref 22–32)
Calcium: 8.1 mg/dL — ABNORMAL LOW (ref 8.9–10.3)
Chloride: 96 mmol/L — ABNORMAL LOW (ref 101–111)
Creatinine, Ser: 1.03 mg/dL (ref 0.61–1.24)
GFR calc Af Amer: >60 mL/min (ref >60)
GFR calc non Af Amer: >60 mL/min (ref >60)
Glucose, Bld: 302 mg/dL — ABNORMAL HIGH (ref 65–99)
Potassium: 4 mmol/L (ref 3.5–5.1)
Sodium: 127 mmol/L — ABNORMAL LOW (ref 135–145)
Total Bilirubin: 0.7 mg/dL (ref 0.3–1.2)
Total Protein: 6.7 g/dL (ref 6.5–8.1)

## 2016-06-10 LAB — CBC WITH DIFFERENTIAL/PLATELET
Basophils Absolute: 0 10*3/uL (ref 0–0.1)
Basophils Relative: 0 %
Eosinophils Absolute: 0 10*3/uL (ref 0–0.7)
Eosinophils Relative: 0 %
HCT: 29.6 % — ABNORMAL LOW (ref 40.0–52.0)
Hemoglobin: 11 g/dL — ABNORMAL LOW (ref 13.0–18.0)
Lymphocytes Relative: 11 %
Lymphs Abs: 0.6 10*3/uL — ABNORMAL LOW (ref 1.0–3.6)
MCH: 32.4 pg (ref 26.0–34.0)
MCHC: 37 g/dL — ABNORMAL HIGH (ref 32.0–36.0)
MCV: 87.6 fL (ref 80.0–100.0)
Monocytes Absolute: 0.4 10*3/uL (ref 0.2–1.0)
Monocytes Relative: 6 %
Neutro Abs: 5 10*3/uL (ref 1.4–6.5)
Neutrophils Relative %: 83 %
Platelets: 217 10*3/uL (ref 150–440)
RBC: 3.38 MIL/uL — ABNORMAL LOW (ref 4.40–5.90)
RDW: 12.5 % (ref 11.5–14.5)
WBC: 6.1 10*3/uL (ref 3.8–10.6)

## 2016-06-10 LAB — MAGNESIUM: Magnesium: 2 mg/dL (ref 1.7–2.4)

## 2016-06-10 LAB — LACTATE DEHYDROGENASE: LDH: 234 U/L — ABNORMAL HIGH (ref 98–192)

## 2016-06-10 MED ORDER — DEXAMETHASONE SODIUM PHOSPHATE 100 MG/10ML IJ SOLN
10.0000 mg | Freq: Once | INTRAMUSCULAR | Status: AC
  Administered 2016-06-10: 10 mg via INTRAVENOUS
  Filled 2016-06-10: qty 1

## 2016-06-10 MED ORDER — FUROSEMIDE 20 MG PO TABS
10.0000 mg | ORAL_TABLET | Freq: Once | ORAL | Status: AC
  Administered 2016-06-10: 10 mg via ORAL
  Filled 2016-06-10: qty 1

## 2016-06-10 MED ORDER — SODIUM CHLORIDE 0.9 % IV SOLN
Freq: Once | INTRAVENOUS | Status: AC
  Administered 2016-06-10: 09:00:00 via INTRAVENOUS
  Filled 2016-06-10: qty 1000

## 2016-06-10 MED ORDER — SODIUM CHLORIDE 0.9 % IV SOLN
100.0000 mg/m2 | Freq: Once | INTRAVENOUS | Status: AC
  Administered 2016-06-10: 250 mg via INTRAVENOUS
  Filled 2016-06-10: qty 12.5

## 2016-06-10 MED ORDER — POTASSIUM CHLORIDE 2 MEQ/ML IV SOLN
Freq: Once | INTRAVENOUS | Status: AC
  Administered 2016-06-10: 10:00:00 via INTRAVENOUS
  Filled 2016-06-10: qty 1000

## 2016-06-10 MED ORDER — SODIUM CHLORIDE 0.9 % IV SOLN
20.0000 mg/m2 | Freq: Once | INTRAVENOUS | Status: AC
  Administered 2016-06-10: 50 mg via INTRAVENOUS
  Filled 2016-06-10: qty 50

## 2016-06-10 NOTE — Telephone Encounter (Signed)
Went and got pt from infusion and weighed him and told him why because of his sodium.  This is what happened below:   I spoke to [place] about this and his wt today 114.2 kg and on 7/10 it was 113.5 kg.  That is wt gain of 6 lbs. You wanted him to have lasix 10 mg po and to take it after he finished all fluids and treatment today. I ordered the med, called allison and asked her to tell pt why he needs it and that it will make him urinate a lot and we will check wt tom also. She said she would tell him.

## 2016-06-10 NOTE — Telephone Encounter (Signed)
-----   Message from Lequita Asal, MD sent at 06/10/2016  9:31 AM EDT -----   Sodium low.  Can they weight Nathan Calderon? He may needs some Lasix.  M ----- Message -----    From: Lab In [place]: 06/10/2016   8:55 AM      To: Lequita Asal, MD

## 2016-06-11 ENCOUNTER — Other Ambulatory Visit: Payer: Self-pay | Admitting: Hematology and Oncology

## 2016-06-11 ENCOUNTER — Inpatient Hospital Stay: Payer: BLUE CROSS/BLUE SHIELD

## 2016-06-11 VITALS — BP 150/77 | HR 80 | Temp 96.2°F | Resp 18

## 2016-06-11 DIAGNOSIS — C6211 Malignant neoplasm of descended right testis: Secondary | ICD-10-CM

## 2016-06-11 DIAGNOSIS — C6291 Malignant neoplasm of right testis, unspecified whether descended or undescended: Secondary | ICD-10-CM | POA: Diagnosis not present

## 2016-06-11 MED ORDER — SODIUM CHLORIDE 0.9 % IV SOLN
Freq: Once | INTRAVENOUS | Status: AC
  Administered 2016-06-11: 09:00:00 via INTRAVENOUS
  Filled 2016-06-11: qty 1000

## 2016-06-11 MED ORDER — SODIUM CHLORIDE 0.9 % IV SOLN
100.0000 mg/m2 | Freq: Once | INTRAVENOUS | Status: AC
  Administered 2016-06-11: 250 mg via INTRAVENOUS
  Filled 2016-06-11: qty 12.5

## 2016-06-11 MED ORDER — PEGFILGRASTIM 6 MG/0.6ML ~~LOC~~ PSKT
6.0000 mg | PREFILLED_SYRINGE | Freq: Once | SUBCUTANEOUS | Status: AC
  Administered 2016-06-11: 6 mg via SUBCUTANEOUS
  Filled 2016-06-11: qty 0.6

## 2016-06-11 MED ORDER — SODIUM CHLORIDE 0.9 % IV SOLN
20.0000 mg/m2 | Freq: Once | INTRAVENOUS | Status: AC
  Administered 2016-06-11: 50 mg via INTRAVENOUS
  Filled 2016-06-11: qty 50

## 2016-06-11 MED ORDER — PALONOSETRON HCL INJECTION 0.25 MG/5ML
0.2500 mg | Freq: Once | INTRAVENOUS | Status: AC
  Administered 2016-06-11: 0.25 mg via INTRAVENOUS
  Filled 2016-06-11: qty 5

## 2016-06-11 MED ORDER — POTASSIUM CHLORIDE 2 MEQ/ML IV SOLN
Freq: Once | INTRAVENOUS | Status: AC
  Administered 2016-06-11: 09:00:00 via INTRAVENOUS
  Filled 2016-06-11: qty 1000

## 2016-06-11 MED ORDER — POLYETHYLENE GLYCOL 3350 17 G PO PACK
17.0000 g | PACK | Freq: Once | ORAL | Status: AC
  Administered 2016-06-11: 17 g via ORAL
  Filled 2016-06-11: qty 1

## 2016-06-11 MED ORDER — SODIUM CHLORIDE 0.9 % IV SOLN
Freq: Once | INTRAVENOUS | Status: AC
  Administered 2016-06-11: 11:00:00 via INTRAVENOUS
  Filled 2016-06-11: qty 5

## 2016-06-13 ENCOUNTER — Other Ambulatory Visit: Payer: Self-pay | Admitting: Hematology and Oncology

## 2016-06-14 ENCOUNTER — Inpatient Hospital Stay (HOSPITAL_BASED_OUTPATIENT_CLINIC_OR_DEPARTMENT_OTHER): Payer: BLUE CROSS/BLUE SHIELD | Admitting: Hematology and Oncology

## 2016-06-14 ENCOUNTER — Inpatient Hospital Stay: Payer: BLUE CROSS/BLUE SHIELD

## 2016-06-14 ENCOUNTER — Encounter: Payer: Self-pay | Admitting: Hematology and Oncology

## 2016-06-14 ENCOUNTER — Other Ambulatory Visit: Payer: Self-pay | Admitting: Hematology and Oncology

## 2016-06-14 VITALS — BP 140/78 | HR 92 | Temp 97.6°F | Resp 18 | Wt 246.5 lb

## 2016-06-14 DIAGNOSIS — H905 Unspecified sensorineural hearing loss: Secondary | ICD-10-CM

## 2016-06-14 DIAGNOSIS — R112 Nausea with vomiting, unspecified: Secondary | ICD-10-CM

## 2016-06-14 DIAGNOSIS — R740 Nonspecific elevation of levels of transaminase and lactic acid dehydrogenase [LDH]: Secondary | ICD-10-CM | POA: Diagnosis not present

## 2016-06-14 DIAGNOSIS — E785 Hyperlipidemia, unspecified: Secondary | ICD-10-CM

## 2016-06-14 DIAGNOSIS — C6211 Malignant neoplasm of descended right testis: Secondary | ICD-10-CM

## 2016-06-14 DIAGNOSIS — Z7982 Long term (current) use of aspirin: Secondary | ICD-10-CM

## 2016-06-14 DIAGNOSIS — I10 Essential (primary) hypertension: Secondary | ICD-10-CM

## 2016-06-14 DIAGNOSIS — Z87442 Personal history of urinary calculi: Secondary | ICD-10-CM

## 2016-06-14 DIAGNOSIS — C6291 Malignant neoplasm of right testis, unspecified whether descended or undescended: Secondary | ICD-10-CM | POA: Diagnosis not present

## 2016-06-14 DIAGNOSIS — D702 Other drug-induced agranulocytosis: Secondary | ICD-10-CM

## 2016-06-14 DIAGNOSIS — F419 Anxiety disorder, unspecified: Secondary | ICD-10-CM

## 2016-06-14 DIAGNOSIS — K5900 Constipation, unspecified: Secondary | ICD-10-CM

## 2016-06-14 DIAGNOSIS — E871 Hypo-osmolality and hyponatremia: Secondary | ICD-10-CM

## 2016-06-14 DIAGNOSIS — Z794 Long term (current) use of insulin: Secondary | ICD-10-CM

## 2016-06-14 DIAGNOSIS — G4730 Sleep apnea, unspecified: Secondary | ICD-10-CM

## 2016-06-14 DIAGNOSIS — R110 Nausea: Secondary | ICD-10-CM

## 2016-06-14 DIAGNOSIS — Z7689 Persons encountering health services in other specified circumstances: Secondary | ICD-10-CM

## 2016-06-14 DIAGNOSIS — R252 Cramp and spasm: Secondary | ICD-10-CM | POA: Diagnosis not present

## 2016-06-14 DIAGNOSIS — E119 Type 2 diabetes mellitus without complications: Secondary | ICD-10-CM

## 2016-06-14 DIAGNOSIS — Z79899 Other long term (current) drug therapy: Secondary | ICD-10-CM

## 2016-06-14 DIAGNOSIS — Z7984 Long term (current) use of oral hypoglycemic drugs: Secondary | ICD-10-CM

## 2016-06-14 LAB — COMPREHENSIVE METABOLIC PANEL
ALT: 19 U/L (ref 17–63)
AST: 16 U/L (ref 15–41)
Albumin: 3.4 g/dL — ABNORMAL LOW (ref 3.5–5.0)
Alkaline Phosphatase: 93 U/L (ref 38–126)
Anion gap: 8 (ref 5–15)
BUN: 15 mg/dL (ref 6–20)
CO2: 28 mmol/L (ref 22–32)
Calcium: 8.2 mg/dL — ABNORMAL LOW (ref 8.9–10.3)
Chloride: 91 mmol/L — ABNORMAL LOW (ref 101–111)
Creatinine, Ser: 0.83 mg/dL (ref 0.61–1.24)
GFR calc Af Amer: >60 mL/min (ref >60)
GFR calc non Af Amer: >60 mL/min (ref >60)
Glucose, Bld: 367 mg/dL — ABNORMAL HIGH (ref 65–99)
Potassium: 3.8 mmol/L (ref 3.5–5.1)
Sodium: 127 mmol/L — ABNORMAL LOW (ref 135–145)
Total Bilirubin: 0.8 mg/dL (ref 0.3–1.2)
Total Protein: 6.1 g/dL — ABNORMAL LOW (ref 6.5–8.1)

## 2016-06-14 LAB — CBC WITH DIFFERENTIAL/PLATELET
Basophils Absolute: 0 10*3/uL (ref 0–0.1)
Basophils Relative: 0 %
Eosinophils Absolute: 0 10*3/uL (ref 0–0.7)
Eosinophils Relative: 0 %
HCT: 29.5 % — ABNORMAL LOW (ref 40.0–52.0)
Hemoglobin: 10.7 g/dL — ABNORMAL LOW (ref 13.0–18.0)
Lymphocytes Relative: 7 %
Lymphs Abs: 0.9 10*3/uL — ABNORMAL LOW (ref 1.0–3.6)
MCH: 31.9 pg (ref 26.0–34.0)
MCHC: 36.2 g/dL — ABNORMAL HIGH (ref 32.0–36.0)
MCV: 88.2 fL (ref 80.0–100.0)
Monocytes Absolute: 0.2 10*3/uL (ref 0.2–1.0)
Monocytes Relative: 1 %
Neutro Abs: 12.1 10*3/uL — ABNORMAL HIGH (ref 1.4–6.5)
Neutrophils Relative %: 92 %
Platelets: 222 10*3/uL (ref 150–440)
RBC: 3.35 MIL/uL — ABNORMAL LOW (ref 4.40–5.90)
RDW: 12.3 % (ref 11.5–14.5)
WBC: 13.2 10*3/uL — ABNORMAL HIGH (ref 3.8–10.6)

## 2016-06-14 LAB — MAGNESIUM: Magnesium: 1.3 mg/dL — ABNORMAL LOW (ref 1.7–2.4)

## 2016-06-14 MED ORDER — SODIUM CHLORIDE 0.9 % IV SOLN
Freq: Once | INTRAVENOUS | Status: DC
  Filled 2016-06-14: qty 4

## 2016-06-14 MED ORDER — PROCHLORPERAZINE MALEATE 10 MG PO TABS: 10.0000 mg | ORAL_TABLET | Freq: Once | ORAL | Status: DC

## 2016-06-14 MED ORDER — SODIUM CHLORIDE 0.9 % IV SOLN
Freq: Once | INTRAVENOUS | Status: DC
  Filled 2016-06-14: qty 1000

## 2016-06-14 MED ORDER — MAGNESIUM SULFATE 2 GM/50ML IV SOLN
2.0000 g | Freq: Once | INTRAVENOUS | Status: DC
  Filled 2016-06-14: qty 50

## 2016-06-14 MED ORDER — SODIUM CHLORIDE 0.9 % IJ SOLN
10.0000 mL | INTRAMUSCULAR | Status: DC | PRN
  Filled 2016-06-14: qty 10

## 2016-06-14 MED ORDER — MAGNESIUM OXIDE 400 MG PO TABS: 400.0000 mg | ORAL_TABLET | Freq: Two times a day (BID) | ORAL | Status: DC

## 2016-06-14 MED ORDER — SODIUM CHLORIDE 0.9 % IV SOLN
30.0000 [IU] | Freq: Once | INTRAVENOUS | Status: DC
  Filled 2016-06-14: qty 10

## 2016-06-14 NOTE — Progress Notes (Signed)
Patient had said the last few times he has been stuck it has been hard to get IV. He feels today will be a hard stick due to he feels dehydrated and nauseated- he took nausea med at 11 am.  He was offered fluids today and also admission to hosp. Pt was only agreeable to fluids here but he really wanted to just go home  But agreed to have infusion try to stick him., Maudie Mercury from infusion called  And said that she stuck him once and he got worse with nausea an almost passed out.  i went over to see pt and he says just the thought of IV insertion has made him feel so much worse and nausea worse. He just wants to go home. I told him it is the request of md to stay and we can admit but he is going home.  I told him that if he feels worse when he gets home she should have his girlfriend take him to ER. He needs to come back tom. And he is agreeable.

## 2016-06-14 NOTE — Progress Notes (Signed)
Patient is here for a follow up, he has been feeling bad since Saturday. Denies any fever, has loose stools. Just overall not feeling very well and weak.

## 2016-06-14 NOTE — Progress Notes (Signed)
[facility] day:  06/14/2016  Chief Complaint: Nathan Calderon is a 55 y.o. male with stage IB right testicular cancer who is seen for assessment on day 8 of cycle #2 bleomycin , etoposide, and cisplatin (BEP).  HPI:  The patient was last seen in the medical oncology clinic on 06/07/2016.  At that time, he began cycle #1 BEP.  He received his 5 days of chemotherapy uneventfully last week.  He received On-Pro Neulasta on 06/11/2016.  Labs last week were notable for an elevated LDH of 450 (98-192).  Follow-up testing on 06/08/2016 revealed a normal CPK and a slightly elevated troponin (0.10).  He was seen by Dr. Nehemiah Massed of cardiology on 06/08/2016.  Chemotherapy continued.  He was also noted to have hypomagnesemia and hyponatremia.  He received extra magnesium on 06/07/2016.  He noted that his oral fluid intake had been adjusted (less fluids with electrolytes).  Weight was up.  He was given oral Lasix.  Extra fluids weight resolved.  Follow-up labs on 06/10/2016 revealed a sodium of 127, blood sugar 302, magnesium 2.0, and LDH 234.  Symptomatically, he has felt weak since 06/11/2016.  He notes issues with constipation.  He received a laxative in clinic.  He ate well on 06/11/2016.  He describes his "stomach in a knot" on 06/12/2016.  He took a stool softener in the morning and evening. Followed by Miralax on 06/13/2016.  He states his bowels "started working this morning".  He notes nausea and emesis x 2.  He has had little to eat.  He describes sipping on fluids.  He has had no fever.   Past Medical History  Diagnosis Date  . Angina     2006, had cardiac cath at the time  . Diabetes mellitus   . Kidney stone     hx of  . Headache(784.0)   . Hypertension   . Hyperlipidemia   . Anxiety   . Sleep apnea   . Testicular cancer ([place]) 04/08/2016    Past Surgical History  Procedure Laterality Date  . Eye surgery      x 2  . Lasik    . Hernia repair       with undecended testicle  . Shoulder surgery      left shoulder  . Pars plana vitrectomy  04/04/2012    Procedure: PARS PLANA VITRECTOMY WITH 25 GAUGE;  Surgeon: [name], MD;  Location: [facility];  Service: Ophthalmology;  Laterality: Left;  with laser  . Orchiectomy Right 04/08/2016    Procedure: ORCHIECTOMY/ INGUINAL APPROACH;  Surgeon: [name], MD;  Location: [facility];  Service: Urology;  Laterality: Right;  . Vasectomy Left 04/08/2016    Procedure: VASECTOMY;  Surgeon: [name], MD;  Location: [facility];  Service: Urology;  Laterality: Left;    Family History  Problem Relation Age of Onset  . Kidney disease Neg Hx   . Prostate cancer Neg Hx   . Diabetes Mother   . Heart disease Mother   . Hypertension Mother   . Heart disease Father   . Ovarian cancer Maternal Grandmother     Social History:  reports that he has never smoked. He has never used smokeless tobacco. He reports that he drinks alcohol. He reports that he does not use illicit drugs.  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  The patient is alone today.  Allergies: No Known Allergies  Current Medications: Current Outpatient Prescriptions  Medication Sig Dispense Refill  . magnesium oxide (MAG-OX) 400 MG tablet Take 1 tablet (400 mg total) by mouth 2 (two) times daily. For 15 days 30 tablet 0  . ALPRAZolam (XANAX) 0.5 MG tablet Take 0.5 mg by mouth every 6 (six) hours as needed. for anxiety  5  . amLODipine (NORVASC) 10 MG tablet Take 10 mg by mouth daily.  5  . aspirin EC 81 MG tablet Take 81 mg by mouth daily.    Marland Kitchen atorvastatin (LIPITOR) 20 MG tablet Take 20 mg by mouth daily.    Marland Kitchen BAYER CONTOUR NEXT TEST test strip TEST TWO TIMES DAILY  3  . docusate sodium (COLACE) 100 MG capsule Take 100 mg by mouth 2 (two) times daily.  Reported on 05/24/2016  0  . glimepiride (AMARYL) 2 MG tablet TAKE 1 TABLET BY MOUTH DAILY WITH FIRST MEAL  11  . Insulin Glargine (TOUJEO SOLOSTAR) 300 UNIT/ML SOPN Inject 65 Units into the skin every evening.    . loratadine (CLARITIN) 10 MG tablet Take 10 mg by mouth daily.    Marland Kitchen losartan (COZAAR) 100 MG tablet Take 100 mg by mouth daily.    . metFORMIN (GLUCOPHAGE) 1000 MG tablet Take 1,000 mg by mouth 2 (two) times daily with a meal.    . MUSE 250 MCG pellet Reported on 04/23/2016  1  . ondansetron (ZOFRAN) 8 MG tablet Take 1 tablet (8 mg total) by mouth every 8 (eight) hours as needed for nausea or vomiting. 20 tablet 0  . prochlorperazine (COMPAZINE) 10 MG tablet Take 1 tablet (10 mg total) by mouth every 6 (six) hours as needed for nausea or vomiting. 30 tablet 1  . [DISCONTINUED] hydrochlorothiazide (HYDRODIURIL) 50 MG tablet Take 50 mg by mouth daily.     No current facility-administered medications for this visit.   Facility-Administered Medications Ordered in Other Visits  Medication Dose Route Frequency Provider Last Rate Last Dose  . 0.9 %  sodium chloride infusion   Intravenous Once Lequita Asal, MD      . 0.9 %  sodium chloride infusion   Intravenous Once Lequita Asal, MD      . bleomycin (BLEOCIN) 30 Units in sodium chloride 0.9 % 50 mL chemo infusion  30 Units Intravenous Once Lequita Asal, MD      . magnesium sulfate IVPB 2 g 50 mL  2 g Intravenous Once Lequita Asal, MD      . ondansetron (ZOFRAN) 8 mg in sodium chloride 0.9 % 50 mL IVPB   Intravenous Once Lequita Asal, MD      . sodium chloride 0.9 % injection 10 mL  10 mL Intracatheter PRN Lequita Asal, MD        Review of Systems:  GENERAL:  Feels weak.  No fever or sweats.  Weigh stable. PERFORMANCE STATUS (ECOG):  2 HEENT:  No visual changes, runny nose, sore throat, mouth sores or tenderness. Lungs: No shortness of breath or cough.  No hemoptysis. Cardiac:  No chest pain,  palpitations, orthopnea, or PND. GI:  Poor oral intake.  Nausea and vomiting x 2.  Constipation requiring Miralax and stool softeners.  No melena or hematochezia. GU:  No urgency, frequency, dysuria, or hematuria. Musculoskeletal:  No back pain.  No joint  pain.  No muscle tenderness. Extremities:  No pain or swelling. Skin:  No rashes or skin changes. Neuro:  General fatigue.  No headache, numbness or weakness, balance or coordination issues. Endocrine:  Diabetes.  No thyroid issues, hot flashes or night sweats. Psych:  No mood changes, depression or anxiety. Pain:  No focal pain. Review of systems:  All other systems reviewed and found to be negative.  Physical Exam: Blood pressure 140/78, pulse 92, temperature 97.6 F (36.4 C), temperature source Tympanic, resp. rate 18, weight 246 lb 7.6 oz (111.8 kg).  No orthostasis. GENERAL:  Well developed, well nourished, gentleman sitting comfortably in the exam room in no acute distress. MENTAL STATUS:  Alert and oriented to person, place and time. HEAD:  Short hair.  Male pattern baldness. Graying beard.  Normocephalic, atraumatic, face symmetric, no Cushingoid features. EYES:  Glasses.  Brown eyes.  No conjunctivitis or scleral icterus. RESPIRATORY:  Clear to auscultation without rales, wheezes or rhonchi. CARDIOVASCULAR:  Regular rate and rhythm without murmur, rub or gallop. ABDOMEN:  Soft, non-tender, with active bowel sounds, and no hepatosplenomegaly.  No masses. SKIN:  No rashes, ulcers or lesions. EXTREMITIES: No edema, skin discoloration or tenderness.  No palpable cords. NEUROLOGICAL: Unremarkable. PSYCH:  Appropriate.  Appointment on 06/14/2016  Component Date Value Ref Range Status  . WBC 06/14/2016 13.2* 3.8 - 10.6 K/uL Final  . RBC 06/14/2016 3.35* 4.40 - 5.90 MIL/uL Final  . Hemoglobin 06/14/2016 10.7* 13.0 - 18.0 g/dL Final  . HCT 06/14/2016 29.5* 40.0 - 52.0 % Final  . MCV 06/14/2016 88.2  80.0 - 100.0 fL Final  . MCH  06/14/2016 31.9  26.0 - 34.0 pg Final  . MCHC 06/14/2016 36.2* 32.0 - 36.0 g/dL Final  . RDW 06/14/2016 12.3  11.5 - 14.5 % Final  . Platelets 06/14/2016 222  150 - 440 K/uL Final  . Neutrophils Relative % 06/14/2016 92   Final  . Neutro Abs 06/14/2016 12.1* 1.4 - 6.5 K/uL Final  . Lymphocytes Relative 06/14/2016 7   Final  . Lymphs Abs 06/14/2016 0.9* 1.0 - 3.6 K/uL Final  . Monocytes Relative 06/14/2016 1   Final  . Monocytes Absolute 06/14/2016 0.2  0.2 - 1.0 K/uL Final  . Eosinophils Relative 06/14/2016 0   Final  . Eosinophils Absolute 06/14/2016 0.0  0 - 0.7 K/uL Final  . Basophils Relative 06/14/2016 0   Final  . Basophils Absolute 06/14/2016 0.0  0 - 0.1 K/uL Final  . Sodium 06/14/2016 127* 135 - 145 mmol/L Final  . Potassium 06/14/2016 3.8  3.5 - 5.1 mmol/L Final  . Chloride 06/14/2016 91* 101 - 111 mmol/L Final  . CO2 06/14/2016 28  22 - 32 mmol/L Final  . Glucose, Bld 06/14/2016 367* 65 - 99 mg/dL Final  . BUN 06/14/2016 15  6 - 20 mg/dL Final  . Creatinine, Ser 06/14/2016 0.83  0.61 - 1.24 mg/dL Final  . Calcium 06/14/2016 8.2* 8.9 - 10.3 mg/dL Final  . Total Protein 06/14/2016 6.1* 6.5 - 8.1 g/dL Final  . Albumin 06/14/2016 3.4* 3.5 - 5.0 g/dL Final  . AST 06/14/2016 16  15 - 41 U/L Final  . ALT 06/14/2016 19  17 - 63 U/L Final  . Alkaline Phosphatase 06/14/2016 93  38 - 126 U/L Final  . Total Bilirubin 06/14/2016 0.8  0.3 - 1.2 mg/dL Final  . GFR calc non Af Amer 06/14/2016 >60  >60 mL/min Final  . GFR calc Af Amer 06/14/2016 >60  >  60 mL/min Final   Comment: (NOTE) The eGFR has been calculated using the CKD EPI equation. This calculation has not been validated in all clinical situations. eGFR's persistently <60 mL/min signify possible Chronic Kidney Disease.   . Anion gap 06/14/2016 8  5 - 15 Final  . Magnesium 06/14/2016 1.3* 1.7 - 2.4 mg/dL Final    Assessment:  Nathan Calderon is a 55 y.o. male with stage IB right testicular cancer s/p orchiectomy on 04/08/2016.   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  There was lymphovascular invasion. Pathologic stage was T2Nx (stage IB).    Abdominal and pelvic CT scan on 04/15/2016 and chest CT on 05/03/2016 revealed no evidence of metastatic disease.   Labs on 04/02/2016 revealed a normal AFP (2.2), beta-hCG (<1) and LDH (203).  Labs on 06/07/2016 revealed a normal AFP (4.9) and beta-hCG (<1).  PFTs on 05/13/2016 revealed FVC 3.82 liters (75%), FEV1 3.06 liters (84%), TLC 5.5  liters (77%), VC 4.00 liters (79%), and DLCO 28.6 ml/mmHg/min (93%).  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  He is currently day 8 of cycle #2 BEP (began 05/17/2016 - 06/07/2016).  He became neutropenic on day 10 of cycle #1.  He has received 6 days of Granix.  He received On-Pro Neulasta with cycle #2.  Labs on 06/07/2016 revealed an elevated LDH of 450 (98-192).  Follow-up testing on 06/08/2016 revealed a normal CPK (50) and a slightly elevated troponin (0.10).  He was seen by cardiology on 06/08/2016.  LDH was 234 on 06/10/2016.  Symptomatically, he has had nausea, vomiting.  Constipation was recently relieved with stool softeners and Miralax.  Oral intake is poor.  He has hyponatremia and hypomagnesemia.  Plan: 1.  Labs today:  CBC with diff, CMP, Mg. 2.  Day 8 bleomycin today. 3.  Discuss IV fluids, IV magnesium, and anti-emetics (Patient does not want to have IV placed secondary to difficulty with lab draw.  He will allow one attempt).  Discuss admission to the hospital (patient declines). 4.  RTC in 1 week for Md assessment, labs (CBC with diff, CMP, Mg), and day 15 bleomycin.  Addendum:  Patient felt faint with IV attempt.  He would not allow further attempt or admission to the hospital.  He left clinic.  Appointment is made for tomorrow for labs (BMP, Mg), IV fluids, electrolyte replacement, anti-emetics,  and day 8 bleomycin.  A prescription for oral magnesium oxide was provided.   Lequita Asal, MD  06/14/2016, 2:00 PM

## 2016-06-15 ENCOUNTER — Inpatient Hospital Stay: Payer: BLUE CROSS/BLUE SHIELD

## 2016-06-15 ENCOUNTER — Encounter: Payer: Self-pay | Admitting: Hematology and Oncology

## 2016-06-15 ENCOUNTER — Inpatient Hospital Stay (HOSPITAL_BASED_OUTPATIENT_CLINIC_OR_DEPARTMENT_OTHER): Payer: BLUE CROSS/BLUE SHIELD | Admitting: Hematology and Oncology

## 2016-06-15 ENCOUNTER — Other Ambulatory Visit: Payer: Self-pay | Admitting: *Deleted

## 2016-06-15 VITALS — BP 135/68 | HR 96 | Temp 98.1°F | Resp 18 | Ht 73.0 in | Wt 245.4 lb

## 2016-06-15 DIAGNOSIS — R110 Nausea: Secondary | ICD-10-CM | POA: Diagnosis not present

## 2016-06-15 DIAGNOSIS — C6211 Malignant neoplasm of descended right testis: Secondary | ICD-10-CM

## 2016-06-15 DIAGNOSIS — Z79899 Other long term (current) drug therapy: Secondary | ICD-10-CM

## 2016-06-15 DIAGNOSIS — E871 Hypo-osmolality and hyponatremia: Secondary | ICD-10-CM

## 2016-06-15 DIAGNOSIS — C6291 Malignant neoplasm of right testis, unspecified whether descended or undescended: Secondary | ICD-10-CM

## 2016-06-15 LAB — BASIC METABOLIC PANEL
Anion gap: 7 (ref 5–15)
BUN: 12 mg/dL (ref 6–20)
CO2: 28 mmol/L (ref 22–32)
Calcium: 8.1 mg/dL — ABNORMAL LOW (ref 8.9–10.3)
Chloride: 92 mmol/L — ABNORMAL LOW (ref 101–111)
Creatinine, Ser: 0.84 mg/dL (ref 0.61–1.24)
GFR calc Af Amer: >60 mL/min (ref >60)
GFR calc non Af Amer: >60 mL/min (ref >60)
Glucose, Bld: 304 mg/dL — ABNORMAL HIGH (ref 65–99)
Potassium: 3.6 mmol/L (ref 3.5–5.1)
Sodium: 127 mmol/L — ABNORMAL LOW (ref 135–145)

## 2016-06-15 LAB — MAGNESIUM: Magnesium: 1.4 mg/dL — ABNORMAL LOW (ref 1.7–2.4)

## 2016-06-15 MED ORDER — ONDANSETRON HCL 40 MG/20ML IJ SOLN
Freq: Once | INTRAMUSCULAR | Status: AC
  Administered 2016-06-15: 12:00:00 via INTRAVENOUS
  Filled 2016-06-15: qty 4

## 2016-06-15 MED ORDER — SODIUM CHLORIDE 0.9 % IV SOLN
30.0000 [IU] | Freq: Once | INTRAVENOUS | Status: AC
  Administered 2016-06-15: 30 [IU] via INTRAVENOUS
  Filled 2016-06-15: qty 10

## 2016-06-15 MED ORDER — PROCHLORPERAZINE MALEATE 10 MG PO TABS: 10.0000 mg | ORAL_TABLET | Freq: Once | ORAL | Status: DC

## 2016-06-15 MED ORDER — MAGNESIUM SULFATE 50 % IJ SOLN
Freq: Once | INTRAMUSCULAR | Status: AC
  Administered 2016-06-15: 12:00:00 via INTRAVENOUS
  Filled 2016-06-15: qty 1000

## 2016-06-15 MED ORDER — SODIUM CHLORIDE 0.9 % IV SOLN
Freq: Once | INTRAVENOUS | Status: AC
  Administered 2016-06-15: 12:00:00 via INTRAVENOUS
  Filled 2016-06-15: qty 1000

## 2016-06-15 NOTE — Progress Notes (Signed)
[facility] day:  06/15/2016  Chief Complaint: Nathan Calderon is a 55 y.o. male with stage IB right testicular cancer who is seen for assessment on day 9 of cycle #2 bleomycin , etoposide, and cisplatin (BEP).  HPI:  The patient was last seen in the medical oncology clinic on 06/14/2016.  At that time, he had nausea and vomiting.  Constipation was relieved with stool softeners and Miralax.  Oral intake was poor.  He had hyponatremia and hypomagnesemia.  We discussed IVF, electrolytes, and day 8 bleomycin.   Patient felt faint with IV attempt.  He would not allow further attempts or admission to the hospital.  He left clinic.   He states that he is doing better today.  He is less nauseated.  He has eaten soup, crackers, and popsicles.  He took oral magnesium.  He has had a bowel movement that was loose.  He would like like to receive what he didn't get yesterday.  He doesn't want to come to clinic the rest of the week.  He wants to try and work.   Past Medical History  Diagnosis Date  . Angina     2006, had cardiac cath at the time  . Diabetes mellitus   . Kidney stone     hx of  . Headache(784.0)   . Hypertension   . Hyperlipidemia   . Anxiety   . Sleep apnea   . Testicular cancer ([place]) 04/08/2016    Past Surgical History  Procedure Laterality Date  . Eye surgery      x 2  . Lasik    . Hernia repair      with undecended testicle  . Shoulder surgery      left shoulder  . Pars plana vitrectomy  04/04/2012    Procedure: PARS PLANA VITRECTOMY WITH 25 GAUGE;  Surgeon: [name], MD;  Location: [facility];  Service: Ophthalmology;  Laterality: Left;  with laser  . Orchiectomy Right 04/08/2016    Procedure: ORCHIECTOMY/ INGUINAL APPROACH;  Surgeon: [name], MD;  Location: [facility];  Service: Urology;  Laterality: Right;  . Vasectomy Left 04/08/2016    Procedure: VASECTOMY;  Surgeon: [name], MD;  Location: [facility];  Service:  Urology;  Laterality: Left;    Family History  Problem Relation Age of Onset  . Kidney disease Neg Hx   . Prostate cancer Neg Hx   . Diabetes Mother   . Heart disease Mother   . Hypertension Mother   . Heart disease Father   . Ovarian cancer Maternal Grandmother     Social History:  reports that he has never smoked. He has never used smokeless tobacco. He reports that he drinks alcohol. He reports that he does not use illicit drugs.  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  The patient is alone today.  Allergies: No Known Allergies  Current Medications: Current Outpatient Prescriptions  Medication Sig Dispense Refill  . ALPRAZolam (XANAX) 0.5 MG tablet Take 0.5 mg by mouth every 6 (six) hours as needed. for anxiety  5  . amLODipine (NORVASC) 10 MG tablet Take 10 mg by mouth daily.  5  . aspirin EC 81 MG tablet Take 81 mg by mouth daily.    Marland Kitchen  atorvastatin (LIPITOR) 20 MG tablet Take 20 mg by mouth daily.    Marland Kitchen BAYER CONTOUR NEXT TEST test strip TEST TWO TIMES DAILY  3  . docusate sodium (COLACE) 100 MG capsule Take 100 mg by mouth 2 (two) times daily. Reported on 05/24/2016  0  . glimepiride (AMARYL) 2 MG tablet TAKE 1 TABLET BY MOUTH DAILY WITH FIRST MEAL  11  . Insulin Glargine (TOUJEO SOLOSTAR) 300 UNIT/ML SOPN Inject 65 Units into the skin every evening.    . loratadine (CLARITIN) 10 MG tablet Take 10 mg by mouth daily.    Marland Kitchen losartan (COZAAR) 100 MG tablet Take 100 mg by mouth daily.    . magnesium oxide (MAG-OX) 400 MG tablet Take 1 tablet (400 mg total) by mouth 2 (two) times daily. For 15 days 30 tablet 0  . metFORMIN (GLUCOPHAGE) 1000 MG tablet Take 1,000 mg by mouth 2 (two) times daily with a meal.    . MUSE 250 MCG pellet Reported on 04/23/2016  1  . ondansetron (ZOFRAN) 8 MG tablet Take 1 tablet (8 mg  total) by mouth every 8 (eight) hours as needed for nausea or vomiting. 20 tablet 0  . prochlorperazine (COMPAZINE) 10 MG tablet Take 1 tablet (10 mg total) by mouth every 6 (six) hours as needed for nausea or vomiting. 30 tablet 1  . [DISCONTINUED] hydrochlorothiazide (HYDRODIURIL) 50 MG tablet Take 50 mg by mouth daily.     No current facility-administered medications for this visit.   Facility-Administered Medications Ordered in Other Visits  Medication Dose Route Frequency Provider Last Rate Last Dose  . 0.9 %  sodium chloride infusion   Intravenous Once Lequita Asal, MD      . 0.9 %  sodium chloride infusion   Intravenous Once Lequita Asal, MD      . bleomycin (BLEOCIN) 30 Units in sodium chloride 0.9 % 50 mL chemo infusion  30 Units Intravenous Once Lequita Asal, MD      . magnesium sulfate IVPB 2 g 50 mL  2 g Intravenous Once Lequita Asal, MD      . ondansetron (ZOFRAN) 8 mg in sodium chloride 0.9 % 50 mL IVPB   Intravenous Once Lequita Asal, MD      . sodium chloride 0.9 % injection 10 mL  10 mL Intracatheter PRN Lequita Asal, MD        Review of Systems:  GENERAL:  Feels better.  No fever or sweats.  Weigh down 1 pound. PERFORMANCE STATUS (ECOG):  2 HEENT:  No visual changes, runny nose, sore throat, mouth sores or tenderness. Lungs: No shortness of breath or cough.  No hemoptysis. Cardiac:  No chest pain, palpitations, orthopnea, or PND. GI:   Oral intake, slightly improved.  Nausea, taking oral antiemetics.  No constipation, melena or hematochezia. GU:  No urgency, frequency, dysuria, or hematuria. Musculoskeletal:  No back pain.  No joint pain.  No muscle tenderness. Extremities:  No pain or swelling. Skin:  No rashes or skin changes. Neuro:  General fatigue, improved.  No headache, numbness or weakness, balance or coordination issues. Endocrine:  Diabetes.  No thyroid issues, hot flashes or night sweats. Psych:  No mood changes, depression  or anxiety. Pain:  No focal pain. Review of systems:  All other systems reviewed and found to be negative.  Physical Exam: Blood pressure 135/68, pulse 96, temperature 98.1 F (36.7 C), temperature source Tympanic, resp. rate 18, height 6' 1"  (1.854 m),  weight 245 lb 6 oz (111.3 kg).  No orthostasis. GENERAL:  Well developed, well nourished, gentleman sitting comfortably in the exam room in no acute distress. MENTAL STATUS:  Alert and oriented to person, place and time. HEAD:  Short hair.  Male pattern baldness. Graying beard.  Normocephalic, atraumatic, face symmetric, no Cushingoid features. EYES:  Glasses.  Brown eyes.  No conjunctivitis or scleral icterus. RESPIRATORY:  Clear to auscultation without rales, wheezes or rhonchi. CARDIOVASCULAR:  Regular rate and rhythm without murmur, rub or gallop. ABDOMEN:  Soft, non-tender, with active bowel sounds, and no hepatosplenomegaly.  No masses. SKIN:  No rashes, ulcers or lesions. EXTREMITIES: No edema, skin discoloration or tenderness.  No palpable cords. NEUROLOGICAL: Unremarkable. PSYCH:  Appropriate.   Appointment on 06/15/2016  Component Date Value Ref Range Status  . Magnesium 06/15/2016 1.4* 1.7 - 2.4 mg/dL Final  . Sodium 06/15/2016 127* 135 - 145 mmol/L Final  . Potassium 06/15/2016 3.6  3.5 - 5.1 mmol/L Final  . Chloride 06/15/2016 92* 101 - 111 mmol/L Final  . CO2 06/15/2016 28  22 - 32 mmol/L Final  . Glucose, Bld 06/15/2016 304* 65 - 99 mg/dL Final  . BUN 06/15/2016 12  6 - 20 mg/dL Final  . Creatinine, Ser 06/15/2016 0.84  0.61 - 1.24 mg/dL Final  . Calcium 06/15/2016 8.1* 8.9 - 10.3 mg/dL Final  . GFR calc non Af Amer 06/15/2016 >60  >60 mL/min Final  . GFR calc Af Amer 06/15/2016 >60  >60 mL/min Final   Comment: (NOTE) The eGFR has been calculated using the CKD EPI equation. This calculation has not been validated in all clinical situations. eGFR's persistently <60 mL/min signify possible Chronic Kidney Disease.    . Anion gap 06/15/2016 7  5 - 15 Final  Appointment on 06/14/2016  Component Date Value Ref Range Status  . WBC 06/14/2016 13.2* 3.8 - 10.6 K/uL Final  . RBC 06/14/2016 3.35* 4.40 - 5.90 MIL/uL Final  . Hemoglobin 06/14/2016 10.7* 13.0 - 18.0 g/dL Final  . HCT 06/14/2016 29.5* 40.0 - 52.0 % Final  . MCV 06/14/2016 88.2  80.0 - 100.0 fL Final  . MCH 06/14/2016 31.9  26.0 - 34.0 pg Final  . MCHC 06/14/2016 36.2* 32.0 - 36.0 g/dL Final  . RDW 06/14/2016 12.3  11.5 - 14.5 % Final  . Platelets 06/14/2016 222  150 - 440 K/uL Final  . Neutrophils Relative % 06/14/2016 92   Final  . Neutro Abs 06/14/2016 12.1* 1.4 - 6.5 K/uL Final  . Lymphocytes Relative 06/14/2016 7   Final  . Lymphs Abs 06/14/2016 0.9* 1.0 - 3.6 K/uL Final  . Monocytes Relative 06/14/2016 1   Final  . Monocytes Absolute 06/14/2016 0.2  0.2 - 1.0 K/uL Final  . Eosinophils Relative 06/14/2016 0   Final  . Eosinophils Absolute 06/14/2016 0.0  0 - 0.7 K/uL Final  . Basophils Relative 06/14/2016 0   Final  . Basophils Absolute 06/14/2016 0.0  0 - 0.1 K/uL Final  . Sodium 06/14/2016 127* 135 - 145 mmol/L Final  . Potassium 06/14/2016 3.8  3.5 - 5.1 mmol/L Final  . Chloride 06/14/2016 91* 101 - 111 mmol/L Final  . CO2 06/14/2016 28  22 - 32 mmol/L Final  . Glucose, Bld 06/14/2016 367* 65 - 99 mg/dL Final  . BUN 06/14/2016 15  6 - 20 mg/dL Final  . Creatinine, Ser 06/14/2016 0.83  0.61 - 1.24 mg/dL Final  . Calcium 06/14/2016 8.2* 8.9 - 10.3 mg/dL Final  .  Total Protein 06/14/2016 6.1* 6.5 - 8.1 g/dL Final  . Albumin 06/14/2016 3.4* 3.5 - 5.0 g/dL Final  . AST 06/14/2016 16  15 - 41 U/L Final  . ALT 06/14/2016 19  17 - 63 U/L Final  . Alkaline Phosphatase 06/14/2016 93  38 - 126 U/L Final  . Total Bilirubin 06/14/2016 0.8  0.3 - 1.2 mg/dL Final  . GFR calc non Af Amer 06/14/2016 >60  >60 mL/min Final  . GFR calc Af Amer 06/14/2016 >60  >60 mL/min Final   Comment: (NOTE) The eGFR has been calculated using the CKD EPI  equation. This calculation has not been validated in all clinical situations. eGFR's persistently <60 mL/min signify possible Chronic Kidney Disease.   . Anion gap 06/14/2016 8  5 - 15 Final  . Magnesium 06/14/2016 1.3* 1.7 - 2.4 mg/dL Final    Assessment:  Nathan Calderon is a 55 y.o. male with stage IB right testicular cancer s/p orchiectomy on 04/08/2016.  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  There was lymphovascular invasion. Pathologic stage was T2Nx (stage IB).    Abdominal and pelvic CT scan on 04/15/2016 and chest CT on 05/03/2016 revealed no evidence of metastatic disease.   Labs on 04/02/2016 revealed a normal AFP (2.2), beta-hCG (<1) and LDH (203).  Labs on 06/07/2016 revealed a normal AFP (4.9) and beta-hCG (<1).  PFTs on 05/13/2016 revealed FVC 3.82 liters (75%), FEV1 3.06 liters (84%), TLC 5.5  liters (77%), VC 4.00 liters (79%), and DLCO 28.6 ml/mmHg/min (93%).  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  He is currently day 9 of cycle #2 BEP (began 05/17/2016 - 06/07/2016).  He became neutropenic on day 10 of cycle #1.  He has received 6 days of Granix.  He received On-Pro Neulasta with cycle #2.  Cycle #2 is notable for nausea and dehydration.  Labs on 06/07/2016 revealed an elevated LDH of 450 (98-192).  Follow-up testing on 06/08/2016 revealed a normal CPK (50) and a slightly elevated troponin (0.10).  He was seen by cardiology on 06/08/2016.  LDH was 234 on 06/10/2016.  Symptomatically, his nausea is better.  Oral intake is slightly better.  He is taking oral magnesium.  He has stable hyponatremia (127) and hypomagnesemia (1.4).  Plan: 1.  Labs today:  BMP, Mg. 2.  Day 8 bleomycin today (he did not receive yesterday). 3.  Discuss IV fluids (500 cc NS), magnesium (2 gm IV), and anti-emetics (ondansetron 8 mg).  4.  Continue fluids with  electrolytes.  Watch free water intake. 5.  Continue magnesium oxide 400 mg po q day. 6.  RTC on 06/21/2016 for MD assessment, labs (CBC with diff, CMP, Mg, LDH), and day 15 bleomycin.   Lequita Asal, MD  06/15/2016, 11:00 AM

## 2016-06-15 NOTE — Progress Notes (Signed)
Pt feeling better today. He was mor ehungry this morning than nauseated but has nasuea lat night for sure and took the nausea med. Last night last dose he took. He had BM this am and it had some form to it but still loose looking. He is not dizzy at all. Took mag. Last night. Feels better but feels like fluids would be a good idea along mag. Today.

## 2016-06-19 ENCOUNTER — Other Ambulatory Visit: Payer: Self-pay | Admitting: Hematology and Oncology

## 2016-06-20 ENCOUNTER — Other Ambulatory Visit: Payer: Self-pay | Admitting: Hematology and Oncology

## 2016-06-20 DIAGNOSIS — E871 Hypo-osmolality and hyponatremia: Secondary | ICD-10-CM

## 2016-06-20 DIAGNOSIS — C6211 Malignant neoplasm of descended right testis: Secondary | ICD-10-CM

## 2016-06-21 ENCOUNTER — Inpatient Hospital Stay: Payer: BLUE CROSS/BLUE SHIELD

## 2016-06-21 ENCOUNTER — Encounter: Payer: Self-pay | Admitting: *Deleted

## 2016-06-21 ENCOUNTER — Inpatient Hospital Stay (HOSPITAL_BASED_OUTPATIENT_CLINIC_OR_DEPARTMENT_OTHER): Payer: BLUE CROSS/BLUE SHIELD | Admitting: Hematology and Oncology

## 2016-06-21 VITALS — BP 150/82 | HR 92 | Temp 98.2°F | Resp 18 | Wt 243.7 lb

## 2016-06-21 VITALS — BP 140/70 | HR 83 | Temp 97.8°F | Resp 18

## 2016-06-21 DIAGNOSIS — C6291 Malignant neoplasm of right testis, unspecified whether descended or undescended: Secondary | ICD-10-CM

## 2016-06-21 DIAGNOSIS — D702 Other drug-induced agranulocytosis: Secondary | ICD-10-CM

## 2016-06-21 DIAGNOSIS — C6211 Malignant neoplasm of descended right testis: Secondary | ICD-10-CM

## 2016-06-21 DIAGNOSIS — R252 Cramp and spasm: Secondary | ICD-10-CM

## 2016-06-21 DIAGNOSIS — R740 Nonspecific elevation of levels of transaminase and lactic acid dehydrogenase [LDH]: Secondary | ICD-10-CM

## 2016-06-21 DIAGNOSIS — K5900 Constipation, unspecified: Secondary | ICD-10-CM

## 2016-06-21 DIAGNOSIS — Z79899 Other long term (current) drug therapy: Secondary | ICD-10-CM

## 2016-06-21 DIAGNOSIS — R112 Nausea with vomiting, unspecified: Secondary | ICD-10-CM

## 2016-06-21 DIAGNOSIS — E871 Hypo-osmolality and hyponatremia: Secondary | ICD-10-CM

## 2016-06-21 LAB — COMPREHENSIVE METABOLIC PANEL
ALT: 20 U/L (ref 17–63)
AST: 22 U/L (ref 15–41)
Albumin: 3.6 g/dL (ref 3.5–5.0)
Alkaline Phosphatase: 117 U/L (ref 38–126)
Anion gap: 12 (ref 5–15)
BUN: 8 mg/dL (ref 6–20)
CO2: 24 mmol/L (ref 22–32)
Calcium: 8.8 mg/dL — ABNORMAL LOW (ref 8.9–10.3)
Chloride: 96 mmol/L — ABNORMAL LOW (ref 101–111)
Creatinine, Ser: 0.76 mg/dL (ref 0.61–1.24)
GFR calc Af Amer: >60 mL/min (ref >60)
GFR calc non Af Amer: >60 mL/min (ref >60)
Glucose, Bld: 282 mg/dL — ABNORMAL HIGH (ref 65–99)
Potassium: 3.7 mmol/L (ref 3.5–5.1)
Sodium: 132 mmol/L — ABNORMAL LOW (ref 135–145)
Total Bilirubin: 0.1 mg/dL — ABNORMAL LOW (ref 0.3–1.2)
Total Protein: 6.7 g/dL (ref 6.5–8.1)

## 2016-06-21 LAB — CBC WITH DIFFERENTIAL/PLATELET
Basophils Absolute: 0.1 10*3/uL (ref 0–0.1)
Basophils Relative: 1 %
Eosinophils Absolute: 0 10*3/uL (ref 0–0.7)
Eosinophils Relative: 0 %
HCT: 30.3 % — ABNORMAL LOW (ref 40.0–52.0)
Hemoglobin: 10.4 g/dL — ABNORMAL LOW (ref 13.0–18.0)
Lymphocytes Relative: 13 %
Lymphs Abs: 2.1 10*3/uL (ref 1.0–3.6)
MCH: 30.8 pg (ref 26.0–34.0)
MCHC: 34.3 g/dL (ref 32.0–36.0)
MCV: 89.9 fL (ref 80.0–100.0)
Monocytes Absolute: 1.2 10*3/uL — ABNORMAL HIGH (ref 0.2–1.0)
Monocytes Relative: 7 %
Neutro Abs: 12.4 10*3/uL — ABNORMAL HIGH (ref 1.4–6.5)
Neutrophils Relative %: 79 %
Platelets: 134 10*3/uL — ABNORMAL LOW (ref 150–440)
RBC: 3.37 MIL/uL — ABNORMAL LOW (ref 4.40–5.90)
RDW: 12.9 % (ref 11.5–14.5)
WBC: 15.7 10*3/uL — ABNORMAL HIGH (ref 3.8–10.6)

## 2016-06-21 LAB — LACTATE DEHYDROGENASE: LDH: 470 U/L — ABNORMAL HIGH (ref 98–192)

## 2016-06-21 LAB — MAGNESIUM: Magnesium: 1.3 mg/dL — ABNORMAL LOW (ref 1.7–2.4)

## 2016-06-21 MED ORDER — SODIUM CHLORIDE 0.9 % IV SOLN
Freq: Once | INTRAVENOUS | Status: AC
  Administered 2016-06-21: 15:00:00 via INTRAVENOUS
  Filled 2016-06-21: qty 1000

## 2016-06-21 MED ORDER — ONDANSETRON HCL 40 MG/20ML IJ SOLN
Freq: Once | INTRAMUSCULAR | Status: AC
  Administered 2016-06-21: 16:00:00 via INTRAVENOUS
  Filled 2016-06-21: qty 4

## 2016-06-21 MED ORDER — SODIUM CHLORIDE 0.9 % IV SOLN
30.0000 [IU] | Freq: Once | INTRAVENOUS | Status: AC
  Administered 2016-06-21: 30 [IU] via INTRAVENOUS
  Filled 2016-06-21: qty 10

## 2016-06-21 MED ORDER — MAGNESIUM SULFATE 2 GM/50ML IV SOLN
2.0000 g | Freq: Once | INTRAVENOUS | Status: AC
  Administered 2016-06-21: 2 g via INTRAVENOUS

## 2016-06-21 NOTE — Progress Notes (Signed)
Patient is here today for follow up, no complaints   Patient is asking for a letter to release him to work Wednesday if he is feeling good and if not would like to go back Monday. Today is last treatment

## 2016-06-21 NOTE — Progress Notes (Signed)
[facility] day:  06/21/16  Chief Complaint: Nathan Calderon is a 55 y.o. male with stage IB right testicular cancer who is seen for assessment on day 15 of cycle #2 bleomycin , etoposide, and cisplatin (BEP).  HPI:  The patient was last seen in the medical oncology clinic on 06/15/2016.  At that time, he was feeling better.  He was less nauseated.  He had stable hyponatremia (127) and hypomagnesemia (1.4).  He received day 8 bleomycin with anti-emetics (ondansetron 8 mg).  In addition, he received IV fluids (500 cc NS) and magnesium (2 gm IV).   He continued his oral magnesium 400 mg a day.  During the interim, he has done well.  His nausea is better.  He has not taken any anti-emetics today.  He stopped the Claritin as it was causing him to be hoarse.  He denied any bone pain caused by Neulasta.  He is drinking Gatorade as well as [employer] (tea and lemonade).   Past Medical History:  Diagnosis Date  . Angina    2006, had cardiac cath at the time  . Anxiety   . Diabetes mellitus   . Headache(784.0)   . Hyperlipidemia   . Hypertension   . Kidney stone    hx of  . Sleep apnea   . Testicular cancer ([place]) 04/08/2016    Past Surgical History:  Procedure Laterality Date  . EYE SURGERY     x 2  . HERNIA REPAIR     with undecended testicle  . LASIK    . ORCHIECTOMY Right 04/08/2016   Procedure: ORCHIECTOMY/ INGUINAL APPROACH;  Surgeon: [name], MD;  Location: [facility];  Service: Urology;  Laterality: Right;  . PARS PLANA VITRECTOMY  04/04/2012   Procedure: PARS PLANA VITRECTOMY WITH 25 GAUGE;  Surgeon: [name], MD;  Location: [place];  Service: Ophthalmology;  Laterality: Left;  with laser  . SHOULDER SURGERY     left shoulder  . VASECTOMY Left 04/08/2016   Procedure: VASECTOMY;  Surgeon: [name], MD;  Location: [facility];  Service: Urology;  Laterality: Left;    Family History  Problem Relation Age of Onset   . Kidney disease Neg Hx   . Prostate cancer Neg Hx   . Diabetes Mother   . Heart disease Mother   . Hypertension Mother   . Heart disease Father   . Ovarian cancer Maternal Grandmother     Social History:  reports that he has never smoked. He has never used smokeless tobacco. He reports that he drinks alcohol. He reports that he does not use drugs.  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  The patient is alone today.  Allergies: No Known Allergies  Current Medications: Current Outpatient Prescriptions  Medication Sig Dispense Refill  . ALPRAZolam (XANAX) 0.5 MG tablet Take 0.5 mg by mouth every 6 (six) hours as needed. for anxiety  5  . amLODipine (NORVASC) 10 MG tablet Take 10 mg by mouth daily.  5  . aspirin EC 81 MG tablet Take 81 mg by mouth daily.    Marland Kitchen atorvastatin (LIPITOR) 20 MG tablet Take 20 mg by mouth daily.    Marland Kitchen BAYER CONTOUR NEXT TEST test strip TEST TWO TIMES  DAILY  3  . docusate sodium (COLACE) 100 MG capsule Take 100 mg by mouth 2 (two) times daily. Reported on 05/24/2016  0  . glimepiride (AMARYL) 2 MG tablet TAKE 1 TABLET BY MOUTH DAILY WITH FIRST MEAL  11  . Insulin Glargine (TOUJEO SOLOSTAR) 300 UNIT/ML SOPN Inject 65 Units into the skin every evening.    Marland Kitchen losartan (COZAAR) 100 MG tablet Take 100 mg by mouth daily.    . magnesium oxide (MAG-OX) 400 MG tablet Take 1 tablet (400 mg total) by mouth 2 (two) times daily. For 15 days 30 tablet 0  . metFORMIN (GLUCOPHAGE) 1000 MG tablet Take 1,000 mg by mouth 2 (two) times daily with a meal.    . MUSE 250 MCG pellet Reported on 04/23/2016  1  . ondansetron (ZOFRAN) 8 MG tablet Take 1 tablet (8 mg total) by mouth every 8 (eight) hours as needed for nausea or vomiting. 20 tablet 0  . prochlorperazine (COMPAZINE) 10 MG tablet Take 1 tablet (10 mg total)  by mouth every 6 (six) hours as needed for nausea or vomiting. 30 tablet 1  . loratadine (CLARITIN) 10 MG tablet Take 10 mg by mouth daily.     No current facility-administered medications for this visit.    Facility-Administered Medications Ordered in Other Visits  Medication Dose Route Frequency Provider Last Rate Last Dose  . 0.9 %  sodium chloride infusion   Intravenous Once Lequita Asal, MD      . 0.9 %  sodium chloride infusion   Intravenous Once Lequita Asal, MD      . bleomycin (BLEOCIN) 30 Units in sodium chloride 0.9 % 50 mL chemo infusion  30 Units Intravenous Once Lequita Asal, MD      . magnesium sulfate IVPB 2 g 50 mL  2 g Intravenous Once Lequita Asal, MD      . ondansetron (ZOFRAN) 8 mg in sodium chloride 0.9 % 50 mL IVPB   Intravenous Once Lequita Asal, MD      . sodium chloride 0.9 % injection 10 mL  10 mL Intracatheter PRN Lequita Asal, MD        Review of Systems:  GENERAL:  Feels better every day.  No fever or sweats.  Weigh down 2 pounds. PERFORMANCE STATUS (ECOG):  2 HEENT:  Claritin dried out sinuses.  No visual changes, runny nose, sore throat, mouth sores or tenderness. Lungs: No shortness of breath or cough.  No hemoptysis. Cardiac:  No chest pain, palpitations, orthopnea, or PND. GI:   Oral intake, improved.  No significant nausea.  Not taking oral antiemetics.  No constipation, melena or hematochezia. GU:  No urgency, frequency, dysuria, or hematuria. Musculoskeletal:  No back pain.  No joint pain.  No muscle tenderness. Extremities:  No pain or swelling. Skin:  No rashes or skin changes. Neuro:  General fatigue, improved.  No headache, numbness or weakness, balance or coordination issues. Endocrine:  Diabetes.  No thyroid issues, hot flashes or night sweats. Psych:  No mood changes, depression or anxiety. Pain:  No focal pain. Review of systems:  All other systems reviewed and found to be negative.  Physical  Exam: Blood pressure (!) 150/82, pulse 92, temperature 98.2 F (36.8 C), temperature source Tympanic, resp. rate 18, weight 243 lb 11.5 oz (110.6 kg).  No orthostasis. GENERAL:  Well developed, well nourished, gentleman sitting comfortably in the exam room in no acute distress. MENTAL STATUS:  Alert and oriented  to person, place and time. HEAD:  Short hair.  Male pattern baldness. Graying beard.  Normocephalic, atraumatic, face symmetric, no Cushingoid features. EYES:  Glasses.  Brown eyes.  No conjunctivitis or scleral icterus. RESPIRATORY:  Clear to auscultation without rales, wheezes or rhonchi. CARDIOVASCULAR:  Regular rate and rhythm without murmur, rub or gallop. ABDOMEN:  Soft, non-tender, with active bowel sounds, and no hepatosplenomegaly.  No masses. SKIN:  No rashes, ulcers or lesions. EXTREMITIES: No edema, skin discoloration or tenderness.  No palpable cords. LYMPH NODES:  No palpable cervical, supraclavicular, axillary or inguinal adenopathy. NEUROLOGICAL: Unremarkable. PSYCH:  Appropriate.   Appointment on 06/21/2016  Component Date Value Ref Range Status  . WBC 06/21/2016 15.7* 3.8 - 10.6 K/uL Final  . RBC 06/21/2016 3.37* 4.40 - 5.90 MIL/uL Final  . Hemoglobin 06/21/2016 10.4* 13.0 - 18.0 g/dL Final  . HCT 06/21/2016 30.3* 40.0 - 52.0 % Final  . MCV 06/21/2016 89.9  80.0 - 100.0 fL Final  . MCH 06/21/2016 30.8  26.0 - 34.0 pg Final  . MCHC 06/21/2016 34.3  32.0 - 36.0 g/dL Final  . RDW 06/21/2016 12.9  11.5 - 14.5 % Final  . Platelets 06/21/2016 134* 150 - 440 K/uL Final  . Neutrophils Relative % 06/21/2016 79%  % Final  . Neutro Abs 06/21/2016 12.4* 1.4 - 6.5 K/uL Final  . Lymphocytes Relative 06/21/2016 13%  % Final  . Lymphs Abs 06/21/2016 2.1  1.0 - 3.6 K/uL Final  . Monocytes Relative 06/21/2016 7%  % Final  . Monocytes Absolute 06/21/2016 1.2* 0.2 - 1.0 K/uL Final  . Eosinophils Relative 06/21/2016 0%  % Final  . Eosinophils Absolute 06/21/2016 0.0  0 - 0.7  K/uL Final  . Basophils Relative 06/21/2016 1%  % Final  . Basophils Absolute 06/21/2016 0.1  0 - 0.1 K/uL Final    Assessment:  ARYEH GASPARIAN is a 55 y.o. male with stage IB right testicular cancer s/p orchiectomy on 04/08/2016.  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  There was lymphovascular invasion. Pathologic stage was T2Nx (stage IB).    Abdominal and pelvic CT scan on 04/15/2016 and chest CT on 05/03/2016 revealed no evidence of metastatic disease.   Labs on 04/02/2016 revealed a normal AFP (2.2), beta-hCG (<1) and LDH (203).  Labs on 06/07/2016 revealed a normal AFP (4.9) and beta-hCG (<1).  PFTs on 05/13/2016 revealed FVC 3.82 liters (75%), FEV1 3.06 liters (84%), TLC 5.5  liters (77%), VC 4.00 liters (79%), and DLCO 28.6 ml/mmHg/min (93%).  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  He is currently day 15 of cycle #2 BEP (05/17/2016 - 06/07/2016).  He became neutropenic on day 10 of cycle #1.  He received 6 days of Granix.  He received On-Pro Neulasta with cycle #2.  Cycle #2 was notable for nausea and dehydration.  Labs on 06/07/2016 revealed an elevated LDH of 450 (98-192).  Follow-up testing on 06/08/2016 revealed a normal CPK (50) and a slightly elevated troponin (0.10).  He was seen by cardiology on 06/08/2016.  LDH was 234 on 06/10/2016.  Symptomatically, his nausea is better.  Oral intake is slightly better.  He is taking oral magnesium.  Magnesium remains low (1.4).  His hyponatremia has improved (132).  Plan: 1.  Labs today:  CBC with diff, CMP, Mg, LDH. 2.  Day 15 bleomycin today. 3.  Magnesium 2 gm IV today  4.  Continue oral magnesium. 5.  Continue fluid with electrolytes. 6.  RTC on 08/04 for labs (CBC with diff, BMP, Mg) +/- IV magnesium 7.  RTC on 08/25 for MD assess, labs (CBC with diff, CMP, LDH, AFP, BHCG) +/- magnesium.   Lequita Asal, MD  06/21/2016, 2:10 PM

## 2016-06-22 ENCOUNTER — Other Ambulatory Visit: Payer: Self-pay | Admitting: *Deleted

## 2016-06-22 DIAGNOSIS — C6211 Malignant neoplasm of descended right testis: Secondary | ICD-10-CM

## 2016-06-28 ENCOUNTER — Encounter: Payer: Self-pay | Admitting: *Deleted

## 2016-06-28 ENCOUNTER — Ambulatory Visit: Payer: BLUE CROSS/BLUE SHIELD

## 2016-06-28 ENCOUNTER — Inpatient Hospital Stay: Payer: BLUE CROSS/BLUE SHIELD

## 2016-06-28 ENCOUNTER — Ambulatory Visit
Admission: RE | Admit: 2016-06-28 | Discharge: 2016-06-28 | Disposition: A | Discharge status: Home / Self Care | Payer: BLUE CROSS/BLUE SHIELD | Source: Ambulatory Visit | Attending: Hematology and Oncology | Admitting: Hematology and Oncology

## 2016-06-28 ENCOUNTER — Inpatient Hospital Stay (HOSPITAL_BASED_OUTPATIENT_CLINIC_OR_DEPARTMENT_OTHER): Payer: BLUE CROSS/BLUE SHIELD | Admitting: Hematology and Oncology

## 2016-06-28 ENCOUNTER — Telehealth: Payer: Self-pay | Admitting: *Deleted

## 2016-06-28 ENCOUNTER — Ambulatory Visit: Payer: BLUE CROSS/BLUE SHIELD | Admitting: Hematology and Oncology

## 2016-06-28 ENCOUNTER — Other Ambulatory Visit: Payer: Self-pay | Admitting: *Deleted

## 2016-06-28 VITALS — BP 142/72 | HR 89 | Temp 96.7°F | Resp 18 | Wt 242.7 lb

## 2016-06-28 DIAGNOSIS — C6211 Malignant neoplasm of descended right testis: Secondary | ICD-10-CM | POA: Diagnosis not present

## 2016-06-28 DIAGNOSIS — R5383 Other fatigue: Secondary | ICD-10-CM

## 2016-06-28 DIAGNOSIS — R252 Cramp and spasm: Secondary | ICD-10-CM

## 2016-06-28 DIAGNOSIS — R05 Cough: Secondary | ICD-10-CM | POA: Insufficient documentation

## 2016-06-28 DIAGNOSIS — E871 Hypo-osmolality and hyponatremia: Secondary | ICD-10-CM

## 2016-06-28 DIAGNOSIS — D649 Anemia, unspecified: Secondary | ICD-10-CM

## 2016-06-28 DIAGNOSIS — R059 Cough, unspecified: Secondary | ICD-10-CM

## 2016-06-28 DIAGNOSIS — Z79899 Other long term (current) drug therapy: Secondary | ICD-10-CM

## 2016-06-28 DIAGNOSIS — D702 Other drug-induced agranulocytosis: Secondary | ICD-10-CM

## 2016-06-28 DIAGNOSIS — C6291 Malignant neoplasm of right testis, unspecified whether descended or undescended: Secondary | ICD-10-CM | POA: Diagnosis not present

## 2016-06-28 DIAGNOSIS — R0602 Shortness of breath: Secondary | ICD-10-CM | POA: Insufficient documentation

## 2016-06-28 DIAGNOSIS — R740 Nonspecific elevation of levels of transaminase and lactic acid dehydrogenase [LDH]: Secondary | ICD-10-CM

## 2016-06-28 DIAGNOSIS — R112 Nausea with vomiting, unspecified: Secondary | ICD-10-CM

## 2016-06-28 DIAGNOSIS — K5900 Constipation, unspecified: Secondary | ICD-10-CM

## 2016-06-28 LAB — COMPREHENSIVE METABOLIC PANEL
ALT: 23 U/L (ref 17–63)
AST: 19 U/L (ref 15–41)
Albumin: 3.7 g/dL (ref 3.5–5.0)
Alkaline Phosphatase: 89 U/L (ref 38–126)
Anion gap: 8 (ref 5–15)
BUN: 10 mg/dL (ref 6–20)
CO2: 26 mmol/L (ref 22–32)
Calcium: 8.9 mg/dL (ref 8.9–10.3)
Chloride: 101 mmol/L (ref 101–111)
Creatinine, Ser: 0.94 mg/dL (ref 0.61–1.24)
GFR calc Af Amer: >60 mL/min (ref >60)
GFR calc non Af Amer: >60 mL/min (ref >60)
Glucose, Bld: 257 mg/dL — ABNORMAL HIGH (ref 65–99)
Potassium: 4.1 mmol/L (ref 3.5–5.1)
Sodium: 135 mmol/L (ref 135–145)
Total Bilirubin: 0.5 mg/dL (ref 0.3–1.2)
Total Protein: 6.9 g/dL (ref 6.5–8.1)

## 2016-06-28 LAB — CBC WITH DIFFERENTIAL/PLATELET
Basophils Absolute: 0 10*3/uL (ref 0–0.1)
Basophils Relative: 0 %
Eosinophils Absolute: 0.1 10*3/uL (ref 0–0.7)
Eosinophils Relative: 1 %
HCT: 28 % — ABNORMAL LOW (ref 40.0–52.0)
Hemoglobin: 10 g/dL — ABNORMAL LOW (ref 13.0–18.0)
Lymphocytes Relative: 12 %
Lymphs Abs: 2.3 10*3/uL (ref 1.0–3.6)
MCH: 31.8 pg (ref 26.0–34.0)
MCHC: 35.5 g/dL (ref 32.0–36.0)
MCV: 89.4 fL (ref 80.0–100.0)
Monocytes Absolute: 1.2 10*3/uL — ABNORMAL HIGH (ref 0.2–1.0)
Monocytes Relative: 6 %
Neutro Abs: 15.7 10*3/uL — ABNORMAL HIGH (ref 1.4–6.5)
Neutrophils Relative %: 81 %
Platelets: 363 10*3/uL (ref 150–440)
RBC: 3.13 MIL/uL — ABNORMAL LOW (ref 4.40–5.90)
RDW: 13.5 % (ref 11.5–14.5)
WBC: 19.3 10*3/uL — ABNORMAL HIGH (ref 3.8–10.6)

## 2016-06-28 LAB — RETICULOCYTES
RBC.: 3.14 MIL/uL — ABNORMAL LOW (ref 4.40–5.90)
Retic Count, Absolute: 191.5 10*3/uL — ABNORMAL HIGH (ref 19.0–183.0)
Retic Ct Pct: 6.1 % — ABNORMAL HIGH (ref 0.4–3.1)

## 2016-06-28 LAB — MAGNESIUM: Magnesium: 1.5 mg/dL — ABNORMAL LOW (ref 1.7–2.4)

## 2016-06-28 NOTE — Progress Notes (Signed)
[facility] day:  06/28/16  Chief Complaint: Nathan Calderon is a 55 y.o. male with stage IB right testicular cancer who is seen for sick call assessment on day 22 of cycle #2 bleomycin , etoposide, and cisplatin (BEP).  HPI:  The patient was last seen in the medical oncology clinic on 06/21/2016.  At that time, he received day 15 bleomycin.  Counts were good.  Sodium had improved.  He required 2 gm of IV magnesium.  He states that he was doing well until recently.  He was pushing a buggy down 3-4 isles at the grocery store and became short of breath.  He states that he can go a little way then has to stop.  He notes a little non-productive cough.  He denies any fever or chills.  He denies any chest pain or lower extremity edema.     Past Medical History:  Diagnosis Date  . Angina    2006, had cardiac cath at the time  . Anxiety   . Diabetes mellitus   . Headache(784.0)   . Hyperlipidemia   . Hypertension   . Kidney stone    hx of  . Sleep apnea   . Testicular cancer ([place]) 04/08/2016    Past Surgical History:  Procedure Laterality Date  . EYE SURGERY     x 2  . HERNIA REPAIR     with undecended testicle  . LASIK    . ORCHIECTOMY Right 04/08/2016   Procedure: ORCHIECTOMY/ INGUINAL APPROACH;  Surgeon: [name], MD;  Location: [facility];  Service: Urology;  Laterality: Right;  . PARS PLANA VITRECTOMY  04/04/2012   Procedure: PARS PLANA VITRECTOMY WITH 25 GAUGE;  Surgeon: [name], MD;  Location: [facility];  Service: Ophthalmology;  Laterality: Left;  with laser  . SHOULDER SURGERY     left shoulder  . VASECTOMY Left 04/08/2016   Procedure: VASECTOMY;  Surgeon: [name], MD;  Location: [facility];  Service: Urology;  Laterality: Left;    Family History  Problem Relation Age of Onset  . Kidney disease Neg Hx   . Prostate cancer Neg Hx   . Diabetes Mother   . Heart disease Mother   . Hypertension Mother   . Heart disease  Father   . Ovarian cancer Maternal Grandmother     Social History:  reports that he has never smoked. He has never used smokeless tobacco. He reports that he drinks alcohol. He reports that he does not use drugs.  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  The patient is alone today.  Allergies: No Known Allergies  Current Medications: Current Outpatient Prescriptions  Medication Sig Dispense Refill  . ALPRAZolam (XANAX) 0.5 MG tablet Take 0.5 mg by mouth every 6 (six) hours as needed. for anxiety  5  . amLODipine (NORVASC) 10 MG tablet Take 10 mg by mouth daily.  5  . aspirin EC 81 MG tablet Take 81 mg by mouth daily.    Marland Kitchen atorvastatin (LIPITOR) 20 MG tablet Take 20 mg by mouth daily.    Marland Kitchen BAYER CONTOUR NEXT TEST test strip TEST TWO TIMES DAILY  3  . docusate sodium (COLACE) 100 MG capsule Take 100 mg by mouth 2 (two) times daily. Reported  on 05/24/2016  0  . glimepiride (AMARYL) 2 MG tablet TAKE 1 TABLET BY MOUTH DAILY WITH FIRST MEAL  11  . Insulin Glargine (TOUJEO SOLOSTAR) 300 UNIT/ML SOPN Inject 65 Units into the skin every evening.    . loratadine (CLARITIN) 10 MG tablet Take 10 mg by mouth daily.    Marland Kitchen losartan (COZAAR) 100 MG tablet Take 100 mg by mouth daily.    . magnesium oxide (MAG-OX) 400 MG tablet Take 1 tablet (400 mg total) by mouth 2 (two) times daily. For 15 days 30 tablet 0  . metFORMIN (GLUCOPHAGE) 1000 MG tablet Take 1,000 mg by mouth 2 (two) times daily with a meal.    . MUSE 250 MCG pellet Reported on 04/23/2016  1  . ondansetron (ZOFRAN) 8 MG tablet Take 1 tablet (8 mg total) by mouth every 8 (eight) hours as needed for nausea or vomiting. 20 tablet 0  . prochlorperazine (COMPAZINE) 10 MG tablet Take 1 tablet (10 mg total) by mouth every 6 (six) hours as needed for nausea or vomiting. 30 tablet 1    No current facility-administered medications for this visit.    Facility-Administered Medications Ordered in Other Visits  Medication Dose Route Frequency Provider Last Rate Last Dose  . 0.9 %  sodium chloride infusion   Intravenous Once Lequita Asal, MD      . 0.9 %  sodium chloride infusion   Intravenous Once Lequita Asal, MD      . bleomycin (BLEOCIN) 30 Units in sodium chloride 0.9 % 50 mL chemo infusion  30 Units Intravenous Once Lequita Asal, MD      . magnesium sulfate IVPB 2 g 50 mL  2 g Intravenous Once Lequita Asal, MD      . ondansetron (ZOFRAN) 8 mg in sodium chloride 0.9 % 50 mL IVPB   Intravenous Once Lequita Asal, MD      . sodium chloride 0.9 % injection 10 mL  10 mL Intracatheter PRN Lequita Asal, MD        Review of Systems:  GENERAL:  Fatigue.  No fever or sweats.  Weigh down 1 pound. PERFORMANCE STATUS (ECOG):  1-2 HEENT:  No visual changes, runny nose, sore throat, mouth sores or tenderness. Lungs:  Shortness of breath with exertion. No productive cough.  No hemoptysis. Cardiac:  No chest pain, palpitations, orthopnea, or PND. GI:   Eating a few things.  No nausea, vomiting, constipation, melena or hematochezia. GU:  No urgency, frequency, dysuria, or hematuria. Musculoskeletal:  No back pain.  No joint pain.  No muscle tenderness. Extremities:  No pain or swelling. Skin:  No rashes or skin changes. Neuro:  General fatigue, improved.  No headache, numbness or weakness, balance or coordination issues. Endocrine:  Diabetes.  No thyroid issues, hot flashes or night sweats. Psych:  No mood changes, depression or anxiety. Pain:  No focal pain. Review of systems:  All other systems reviewed and found to be negative.  Physical Exam: Blood pressure (!) 142/72, pulse 89, temperature (!) 96.7 F (35.9 C), temperature source Tympanic, resp. rate 18, weight 242 lb 11.6 oz (110.1 kg), SpO2 97 %.  GENERAL:  Well developed, well nourished,  gentleman sitting comfortably in the exam room in no acute distress. MENTAL STATUS:  Alert and oriented to person, place and time. HEAD:  Short hair.  Male pattern baldness. Graying beard.  Normocephalic, atraumatic, face symmetric, no Cushingoid features. EYES:  Glasses.  Brown eyes.  No conjunctivitis or scleral icterus. RESPIRATORY:  Clear to auscultation without rales, wheezes or rhonchi. CARDIOVASCULAR:  Regular rate and rhythm without murmur, rub or gallop. ABDOMEN:  Soft, non-tender, with active bowel sounds, and no hepatosplenomegaly.  No masses. SKIN:  No rashes, ulcers or lesions. LYMPH NODES:  No palpable cervical, supraclavicular, axillary or inguinal adenopathy. EXTREMITIES: No edema, skin discoloration or tenderness.  No palpable cords. NEUROLOGICAL: Unremarkable. PSYCH:  Appropriate.   Appointment on 06/28/2016  Component Date Value Ref Range Status  . WBC 06/28/2016 19.3* 3.8 - 10.6 K/uL Final  . RBC 06/28/2016 3.13* 4.40 - 5.90 MIL/uL Final  . Hemoglobin 06/28/2016 10.0* 13.0 - 18.0 g/dL Final  . HCT 06/28/2016 28.0* 40.0 - 52.0 % Final  . MCV 06/28/2016 89.4  80.0 - 100.0 fL Final  . MCH 06/28/2016 31.8  26.0 - 34.0 pg Final  . MCHC 06/28/2016 35.5  32.0 - 36.0 g/dL Final  . RDW 06/28/2016 13.5  11.5 - 14.5 % Final  . Platelets 06/28/2016 363  150 - 440 K/uL Final  . Neutrophils Relative % 06/28/2016 81%  % Final  . Neutro Abs 06/28/2016 15.7* 1.4 - 6.5 K/uL Final  . Lymphocytes Relative 06/28/2016 12%  % Final  . Lymphs Abs 06/28/2016 2.3  1.0 - 3.6 K/uL Final  . Monocytes Relative 06/28/2016 6%  % Final  . Monocytes Absolute 06/28/2016 1.2* 0.2 - 1.0 K/uL Final  . Eosinophils Relative 06/28/2016 1%  % Final  . Eosinophils Absolute 06/28/2016 0.1  0 - 0.7 K/uL Final  . Basophils Relative 06/28/2016 0%  % Final  . Basophils Absolute 06/28/2016 0.0  0 - 0.1 K/uL Final  . Sodium 06/28/2016 135  135 - 145 mmol/L Final  . Potassium 06/28/2016 4.1  3.5 - 5.1 mmol/L  Final  . Chloride 06/28/2016 101  101 - 111 mmol/L Final  . CO2 06/28/2016 26  22 - 32 mmol/L Final  . Glucose, Bld 06/28/2016 257* 65 - 99 mg/dL Final  . BUN 06/28/2016 10  6 - 20 mg/dL Final  . Creatinine, Ser 06/28/2016 0.94  0.61 - 1.24 mg/dL Final  . Calcium 06/28/2016 8.9  8.9 - 10.3 mg/dL Final  . Total Protein 06/28/2016 6.9  6.5 - 8.1 g/dL Final  . Albumin 06/28/2016 3.7  3.5 - 5.0 g/dL Final  . AST 06/28/2016 19  15 - 41 U/L Final  . ALT 06/28/2016 23  17 - 63 U/L Final  . Alkaline Phosphatase 06/28/2016 89  38 - 126 U/L Final  . Total Bilirubin 06/28/2016 0.5  0.3 - 1.2 mg/dL Final  . GFR calc non Af Amer 06/28/2016 >60  >60 mL/min Final  . GFR calc Af Amer 06/28/2016 >60  >60 mL/min Final   Comment: (NOTE) The eGFR has been calculated using the CKD EPI equation. This calculation has not been validated in all clinical situations. eGFR's persistently <60 mL/min signify possible Chronic Kidney Disease.   . Anion gap 06/28/2016 8  5 - 15 Final    Assessment:  Nathan Calderon is a 55 y.o. male with stage IB right testicular cancer s/p orchiectomy on 04/08/2016.  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  There was lymphovascular invasion. Pathologic stage was T2Nx (stage IB).    Abdominal and pelvic CT scan on 04/15/2016 and chest CT on 05/03/2016 revealed no evidence of metastatic disease.   Labs on 04/02/2016 revealed a normal AFP (2.2), beta-hCG (<1) and  LDH (203).  Labs on 06/07/2016 revealed a normal AFP (4.9) and beta-hCG (<1).  PFTs on 05/13/2016 revealed FVC 3.82 liters (75%), FEV1 3.06 liters (84%), TLC 5.5  liters (77%), VC 4.00 liters (79%), and DLCO 28.6 ml/mmHg/min (93%).  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  He received 2 cycles of cycles of BEP (05/17/2016 - 06/07/2016).    Labs on 06/07/2016 revealed an elevated LDH of 450 (98-192).   Follow-up testing on 06/08/2016 revealed a normal CPK (50) and a slightly elevated troponin (0.10).  He was seen by cardiology on 06/08/2016.  LDH was 234 on 06/10/2016.  He has symptomatic anemia.  CXR today reveals no active cardiopulmonary disease.  Symptomatically, he has shortness of breath with exertion likely secondary to his anemia.  Exam is normal.  Hematocrit has decreased from 39.5 to 28.0.  Sodium is normal.  Magnesium has improved (1.5).  Plan: 1.  Labs today:  CBC with diff, CMP, Mg, retic. 2.  CXR (PA and lateral):  cough. 3.  Continue oral magnesium. 4.  Release back to work. 5.  RTC as previously scheduled.   Lequita Asal, MD  06/28/2016, 3:26 PM

## 2016-06-28 NOTE — Progress Notes (Signed)
Patient is here for follow up, mentions he feels like he has little energy, some SOB, he has a dry cough. He feels like he is dragging today. He needs a letter to allow him back to work.

## 2016-06-28 NOTE — Telephone Encounter (Signed)
Pt called this am, so fatigued, noticed he was sob just walking behind the cart. When he sits he is not sob.  Just so fatigued he does not feel like he can go back to work today. He noticed that his hgb was down last week and thinks it is worse.  He is coughing a lot.  I spoke to [place] and she wants him to have cxr today and she and the pt wants to be seen today. Made appt for blood work, cxr, and see md this afternoon and pt notified and he is agreeable to the plan.

## 2016-06-28 NOTE — Telephone Encounter (Signed)
Called pt with retic results and the numbers are coming up so bone marrow is recovering based on these labs.  His mag 1.5 and she wants him to [place] to bid and he will  Increase it and call me if he needs refills..  Based on Dr. [name] and pt speaking today in clinic if mag was coming up he can cancel his appt for Friday and so I am cancelling appt.

## 2016-07-02 ENCOUNTER — Ambulatory Visit: Payer: BLUE CROSS/BLUE SHIELD

## 2016-07-02 ENCOUNTER — Other Ambulatory Visit: Payer: BLUE CROSS/BLUE SHIELD

## 2016-07-04 ENCOUNTER — Encounter: Payer: Self-pay | Admitting: Hematology and Oncology

## 2016-07-05 ENCOUNTER — Inpatient Hospital Stay: Payer: BLUE CROSS/BLUE SHIELD | Attending: Hematology and Oncology

## 2016-07-05 ENCOUNTER — Encounter: Payer: Self-pay | Admitting: *Deleted

## 2016-07-05 ENCOUNTER — Telehealth: Payer: Self-pay | Admitting: *Deleted

## 2016-07-05 ENCOUNTER — Encounter: Payer: Self-pay | Admitting: Hematology and Oncology

## 2016-07-05 ENCOUNTER — Inpatient Hospital Stay (HOSPITAL_BASED_OUTPATIENT_CLINIC_OR_DEPARTMENT_OTHER): Payer: BLUE CROSS/BLUE SHIELD | Admitting: Hematology and Oncology

## 2016-07-05 VITALS — BP 149/77 | HR 100 | Temp 96.6°F | Resp 18 | Wt 243.5 lb

## 2016-07-05 DIAGNOSIS — R740 Nonspecific elevation of levels of transaminase and lactic acid dehydrogenase [LDH]: Secondary | ICD-10-CM

## 2016-07-05 DIAGNOSIS — Z7982 Long term (current) use of aspirin: Secondary | ICD-10-CM | POA: Diagnosis not present

## 2016-07-05 DIAGNOSIS — E871 Hypo-osmolality and hyponatremia: Secondary | ICD-10-CM | POA: Insufficient documentation

## 2016-07-05 DIAGNOSIS — Z7984 Long term (current) use of oral hypoglycemic drugs: Secondary | ICD-10-CM | POA: Insufficient documentation

## 2016-07-05 DIAGNOSIS — I10 Essential (primary) hypertension: Secondary | ICD-10-CM | POA: Insufficient documentation

## 2016-07-05 DIAGNOSIS — Z87442 Personal history of urinary calculi: Secondary | ICD-10-CM | POA: Insufficient documentation

## 2016-07-05 DIAGNOSIS — E119 Type 2 diabetes mellitus without complications: Secondary | ICD-10-CM | POA: Insufficient documentation

## 2016-07-05 DIAGNOSIS — H905 Unspecified sensorineural hearing loss: Secondary | ICD-10-CM

## 2016-07-05 DIAGNOSIS — C6211 Malignant neoplasm of descended right testis: Secondary | ICD-10-CM

## 2016-07-05 DIAGNOSIS — D649 Anemia, unspecified: Secondary | ICD-10-CM | POA: Diagnosis not present

## 2016-07-05 DIAGNOSIS — C6291 Malignant neoplasm of right testis, unspecified whether descended or undescended: Secondary | ICD-10-CM | POA: Insufficient documentation

## 2016-07-05 DIAGNOSIS — E785 Hyperlipidemia, unspecified: Secondary | ICD-10-CM | POA: Insufficient documentation

## 2016-07-05 DIAGNOSIS — G4730 Sleep apnea, unspecified: Secondary | ICD-10-CM | POA: Diagnosis not present

## 2016-07-05 DIAGNOSIS — Z79899 Other long term (current) drug therapy: Secondary | ICD-10-CM | POA: Insufficient documentation

## 2016-07-05 DIAGNOSIS — F419 Anxiety disorder, unspecified: Secondary | ICD-10-CM | POA: Diagnosis not present

## 2016-07-05 DIAGNOSIS — R05 Cough: Secondary | ICD-10-CM

## 2016-07-05 DIAGNOSIS — Z794 Long term (current) use of insulin: Secondary | ICD-10-CM | POA: Insufficient documentation

## 2016-07-05 DIAGNOSIS — R059 Cough, unspecified: Secondary | ICD-10-CM

## 2016-07-05 LAB — COMPREHENSIVE METABOLIC PANEL
ALT: 18 U/L (ref 17–63)
AST: 16 U/L (ref 15–41)
Albumin: 3.7 g/dL (ref 3.5–5.0)
Alkaline Phosphatase: 69 U/L (ref 38–126)
Anion gap: 7 (ref 5–15)
BUN: 16 mg/dL (ref 6–20)
CO2: 24 mmol/L (ref 22–32)
Calcium: 8.7 mg/dL — ABNORMAL LOW (ref 8.9–10.3)
Chloride: 99 mmol/L — ABNORMAL LOW (ref 101–111)
Creatinine, Ser: 0.87 mg/dL (ref 0.61–1.24)
GFR calc Af Amer: >60 mL/min (ref >60)
GFR calc non Af Amer: >60 mL/min (ref >60)
Glucose, Bld: 384 mg/dL — ABNORMAL HIGH (ref 65–99)
Potassium: 4.2 mmol/L (ref 3.5–5.1)
Sodium: 130 mmol/L — ABNORMAL LOW (ref 135–145)
Total Bilirubin: 0.5 mg/dL (ref 0.3–1.2)
Total Protein: 6.7 g/dL (ref 6.5–8.1)

## 2016-07-05 LAB — CBC WITH DIFFERENTIAL/PLATELET
Basophils Absolute: 0 10*3/uL (ref 0–0.1)
Basophils Relative: 1 %
Eosinophils Absolute: 0.1 10*3/uL (ref 0–0.7)
Eosinophils Relative: 1 %
HCT: 29.3 % — ABNORMAL LOW (ref 40.0–52.0)
Hemoglobin: 10.5 g/dL — ABNORMAL LOW (ref 13.0–18.0)
Lymphocytes Relative: 16 %
Lymphs Abs: 1.1 10*3/uL (ref 1.0–3.6)
MCH: 32.7 pg (ref 26.0–34.0)
MCHC: 35.9 g/dL (ref 32.0–36.0)
MCV: 91.3 fL (ref 80.0–100.0)
Monocytes Absolute: 0.7 10*3/uL (ref 0.2–1.0)
Monocytes Relative: 10 %
Neutro Abs: 5.1 10*3/uL (ref 1.4–6.5)
Neutrophils Relative %: 72 %
Platelets: 335 10*3/uL (ref 150–440)
RBC: 3.21 MIL/uL — ABNORMAL LOW (ref 4.40–5.90)
RDW: 15 % — ABNORMAL HIGH (ref 11.5–14.5)
WBC: 7 10*3/uL (ref 3.8–10.6)

## 2016-07-05 LAB — SAMPLE TO BLOOD BANK

## 2016-07-05 LAB — MAGNESIUM: Magnesium: 1.7 mg/dL (ref 1.7–2.4)

## 2016-07-05 MED ORDER — BENZONATATE 100 MG PO CAPS: 100.0000 mg | ORAL_CAPSULE | Freq: Three times a day (TID) | ORAL | 0 refills | Status: DC | PRN

## 2016-07-05 NOTE — Telephone Encounter (Signed)
Pt called and is so fatigued he can't do anything.  He does not feel like he has energy and wants to know if he can come in and get labs and bee seen today.  He is so tired that he wants afternoon appt.  Called pt back and has lab appt 1:15 and see md 1:45. He is agreeable to this.

## 2016-07-05 NOTE — Progress Notes (Signed)
Patient says he is not feeling well but not bad. He has a nagging cough that keeps him up at night. No fever.

## 2016-07-05 NOTE — Progress Notes (Signed)
[facility]  Clinic day:  07/05/16  Chief Complaint: Nathan Calderon is a 55 y.o. male with stage IB right testicular cancer who is seen for sick call assessment on day 29 of cycle #2 bleomycin , etoposide, and cisplatin (BEP).  HPI:  The patient was last seen in the medical oncology clinic on 06/28/2016.  At that time, he was seen for sick call visit.  He had shortness of breath with exertion likely secondary to his anemia.  Exam was normal.  Hematocrit had decreased from a baseline of 39.5 to 28.0.  CXR was normal.  The patient states that he felt better over the weekend. He is waking up at night with a dry cough. Without sleep, he feels tired. He denies any postnasal drip. He has a tickle in the back of his throat.  He is concerned about returning to work where it is hot. When he is hot, he coughs.  He denies any shortness of breath.  He notes that when he has a cough, it wears him out. He is drinking less Gatorade (1 a day instead of 2 a day) sees. He is eating foods with salt. He ate pizza recently. He feels that his energy level is overall better.  He has a follow-up appointment with cardiology on 07/23/2016.   Past Medical History:  Diagnosis Date  . Angina    2006, had cardiac cath at the time  . Anxiety   . Diabetes mellitus   . Headache(784.0)   . Hyperlipidemia   . Hypertension   . Kidney stone    hx of  . Sleep apnea   . Testicular cancer ([facility]) 04/08/2016    Past Surgical History:  Procedure Laterality Date  . EYE SURGERY     x 2  . HERNIA REPAIR     with undecended testicle  . LASIK    . ORCHIECTOMY Right 04/08/2016   Procedure: ORCHIECTOMY/ INGUINAL APPROACH;  Surgeon: Vanna Scotland, MD;  Location: [facility];  Service: Urology;  Laterality: Right;  . PARS PLANA VITRECTOMY  04/04/2012   Procedure: PARS PLANA VITRECTOMY WITH 25 GAUGE;  Surgeon: Sherrie George, MD;  Location: [facility];  Service: Ophthalmology;  Laterality: Left;  with  laser  . SHOULDER SURGERY     left shoulder  . VASECTOMY Left 04/08/2016   Procedure: VASECTOMY;  Surgeon: Vanna Scotland, MD;  Location: [facility];  Service: Urology;  Laterality: Left;    Family History  Problem Relation Age of Onset  . Kidney disease Neg Hx   . Prostate cancer Neg Hx   . Diabetes Mother   . Heart disease Mother   . Hypertension Mother   . Heart disease Father   . Ovarian cancer Maternal Grandmother     Social History:  reports that he has never smoked. He has never used smokeless tobacco. He reports that he drinks alcohol. He reports that he does not use drugs.  He works in [employer] Monday through Thursday 10 hours a day. 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  The patient is alone today.  Allergies: No Known Allergies  Current Medications: Current Outpatient Prescriptions  Medication Sig Dispense Refill  . ALPRAZolam (XANAX) 0.5 MG tablet Take 0.5 mg by mouth every 6 (six) hours as needed. for anxiety  5  . amLODipine (NORVASC) 10  MG tablet Take 10 mg by mouth daily.  5  . aspirin EC 81 MG tablet Take 81 mg by mouth daily.    Marland Kitchen atorvastatin (LIPITOR) 20 MG tablet Take 20 mg by mouth daily.    Marland Kitchen BAYER CONTOUR NEXT TEST test strip TEST TWO TIMES DAILY  3  . docusate sodium (COLACE) 100 MG capsule Take 100 mg by mouth 2 (two) times daily. Reported on 05/24/2016  0  . glimepiride (AMARYL) 2 MG tablet TAKE 1 TABLET BY MOUTH DAILY WITH FIRST MEAL  11  . Insulin Glargine (TOUJEO SOLOSTAR) 300 UNIT/ML SOPN Inject 65 Units into the skin every evening.    Marland Kitchen losartan (COZAAR) 100 MG tablet Take 100 mg by mouth daily.    . magnesium oxide (MAG-OX) 400 MG tablet Take 1 tablet (400 mg total) by mouth 2 (two) times daily. For 15 days 30 tablet 0  . metFORMIN (GLUCOPHAGE) 1000 MG tablet Take 1,000 mg by mouth 2 (two) times daily with a meal.    . MUSE 250 MCG  pellet Reported on 04/23/2016  1  . ondansetron (ZOFRAN) 8 MG tablet Take 1 tablet (8 mg total) by mouth every 8 (eight) hours as needed for nausea or vomiting. 20 tablet 0  . prochlorperazine (COMPAZINE) 10 MG tablet Take 1 tablet (10 mg total) by mouth every 6 (six) hours as needed for nausea or vomiting. 30 tablet 1   No current facility-administered medications for this visit.    Facility-Administered Medications Ordered in Other Visits  Medication Dose Route Frequency Provider Last Rate Last Dose  . 0.9 %  sodium chloride infusion   Intravenous Once Lequita Asal, MD      . 0.9 %  sodium chloride infusion   Intravenous Once Lequita Asal, MD      . bleomycin (BLEOCIN) 30 Units in sodium chloride 0.9 % 50 mL chemo infusion  30 Units Intravenous Once Lequita Asal, MD      . magnesium sulfate IVPB 2 g 50 mL  2 g Intravenous Once Lequita Asal, MD      . ondansetron (ZOFRAN) 8 mg in sodium chloride 0.9 % 50 mL IVPB   Intravenous Once Lequita Asal, MD      . sodium chloride 0.9 % injection 10 mL  10 mL Intracatheter PRN Lequita Asal, MD        Review of Systems:  GENERAL:  Feels better.  Still tired.  No fever or sweats.  Weigh up 1 pound. PERFORMANCE STATUS (ECOG):  1-2 HEENT:  Tickle in back of throat.  No visual changes, runny nose, sore throat, mouth sores or tenderness. Lungs:  No shortness of breath. Dry cough.  No hemoptysis. Cardiac:  No chest pain, palpitations, orthopnea, or PND. GI:   No nausea, vomiting, constipation, melena or hematochezia. GU:  No urgency, frequency, dysuria, or hematuria. Musculoskeletal:  No back pain.  No joint pain.  No muscle tenderness. Extremities:  No pain or swelling. Skin:  No rashes or skin changes. Neuro:  General fatigue, improved.  No headache, numbness or weakness, balance or coordination issues. Endocrine:  Diabetes.  No thyroid issues, hot flashes or night sweats. Psych:  No mood changes, depression or  anxiety. Pain:  No focal pain. Review of systems:  All other systems reviewed and found to be negative.  Physical Exam: Blood pressure (!) 149/77, pulse 100, temperature (!) 96.6 F (35.9 C), temperature source Tympanic, resp. rate 18, weight 243 lb 8  oz (110.5 kg).  GENERAL:  Well developed, well nourished, gentleman sitting comfortably in the exam room in no acute distress. MENTAL STATUS:  Alert and oriented to person, place and time. HEAD:  Short hair.  Male pattern baldness. Graying beard.  Normocephalic, atraumatic, face symmetric, no Cushingoid features. EYES:  Glasses.  Brown eyes.  No conjunctivitis or scleral icterus. RESPIRATORY:  Clear to auscultation without rales, wheezes or rhonchi. CARDIOVASCULAR:  Regular rate and rhythm without murmur, rub or gallop. SKIN:  No rashes, ulcers or lesions. EXTREMITIES: No edema, skin discoloration or tenderness.  No palpable cords. NEUROLOGICAL: Unremarkable. PSYCH:  Appropriate.   Appointment on 07/05/2016  Component Date Value Ref Range Status  . WBC 07/05/2016 7.0  3.8 - 10.6 K/uL Final  . RBC 07/05/2016 3.21* 4.40 - 5.90 MIL/uL Final  . Hemoglobin 07/05/2016 10.5* 13.0 - 18.0 g/dL Final  . HCT 07/05/2016 29.3* 40.0 - 52.0 % Final  . MCV 07/05/2016 91.3  80.0 - 100.0 fL Final  . MCH 07/05/2016 32.7  26.0 - 34.0 pg Final  . MCHC 07/05/2016 35.9  32.0 - 36.0 g/dL Final  . RDW 07/05/2016 15.0* 11.5 - 14.5 % Final  . Platelets 07/05/2016 335  150 - 440 K/uL Final  . Neutrophils Relative % 07/05/2016 72  % Final  . Neutro Abs 07/05/2016 5.1  1.4 - 6.5 K/uL Final  . Lymphocytes Relative 07/05/2016 16  % Final  . Lymphs Abs 07/05/2016 1.1  1.0 - 3.6 K/uL Final  . Monocytes Relative 07/05/2016 10  % Final  . Monocytes Absolute 07/05/2016 0.7  0.2 - 1.0 K/uL Final  . Eosinophils Relative 07/05/2016 1  % Final  . Eosinophils Absolute 07/05/2016 0.1  0 - 0.7 K/uL Final  . Basophils Relative 07/05/2016 1  % Final  . Basophils Absolute  07/05/2016 0.0  0 - 0.1 K/uL Final  . Sodium 07/05/2016 130* 135 - 145 mmol/L Final  . Potassium 07/05/2016 4.2  3.5 - 5.1 mmol/L Final  . Chloride 07/05/2016 99* 101 - 111 mmol/L Final  . CO2 07/05/2016 24  22 - 32 mmol/L Final  . Glucose, Bld 07/05/2016 384* 65 - 99 mg/dL Final  . BUN 07/05/2016 16  6 - 20 mg/dL Final  . Creatinine, Ser 07/05/2016 0.87  0.61 - 1.24 mg/dL Final  . Calcium 07/05/2016 8.7* 8.9 - 10.3 mg/dL Final  . Total Protein 07/05/2016 6.7  6.5 - 8.1 g/dL Final  . Albumin 07/05/2016 3.7  3.5 - 5.0 g/dL Final  . AST 07/05/2016 16  15 - 41 U/L Final  . ALT 07/05/2016 18  17 - 63 U/L Final  . Alkaline Phosphatase 07/05/2016 69  38 - 126 U/L Final  . Total Bilirubin 07/05/2016 0.5  0.3 - 1.2 mg/dL Final  . GFR calc non Af Amer 07/05/2016 >60  >60 mL/min Final  . GFR calc Af Amer 07/05/2016 >60  >60 mL/min Final   Comment: (NOTE) The eGFR has been calculated using the CKD EPI equation. This calculation has not been validated in all clinical situations. eGFR's persistently <60 mL/min signify possible Chronic Kidney Disease.   . Anion gap 07/05/2016 7  5 - 15 Final  . Blood Bank Specimen 07/05/2016 SAMPLE AVAILABLE FOR TESTING   Final  . Sample Expiration 07/05/2016 07/08/2016   Final  . Magnesium 07/05/2016 1.7  1.7 - 2.4 mg/dL Final    Assessment:  ESTEVEN OVERFELT is a 55 y.o. male with stage IB right testicular cancer s/p orchiectomy on  04/08/2016.  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  There was lymphovascular invasion. Pathologic stage was T2Nx (stage IB).    Abdominal and pelvic CT scan on 04/15/2016 and chest CT on 05/03/2016 revealed no evidence of metastatic disease.   Labs on 04/02/2016 revealed a normal AFP (2.2), beta-hCG (<1) and LDH (203).  Labs on 06/07/2016 revealed a normal AFP (4.9) and beta-hCG (<1).  PFTs on 05/13/2016 revealed FVC 3.82 liters (75%), FEV1 3.06 liters (84%),  TLC 5.5  liters (77%), VC 4.00 liters (79%), and DLCO 28.6 ml/mmHg/min (93%).  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  He received 2 cycles of cycles of BEP (05/17/2016 - 06/07/2016).    Labs on 06/07/2016 revealed an elevated LDH of 450 (98-192).  Follow-up testing on 06/08/2016 revealed a normal CPK (50) and a slightly elevated troponin (0.10).  He was seen by cardiology on 06/08/2016.  LDH was 234 on 06/10/2016.  He has a normocytic anemia likely secondary to chemotherapy.  Labs on 05/19/2016 revealed a normal ferritin (186), B12 (350), folate (20.2), and TSH (2.737).  Reticulocyte count was 2.4%.  Hematocrit has improved from 28.0 to 29.3.  CXR on 06/28/2016 revealed no active cardiopulmonary disease.  Symptomatically, he has a chronic cough caused by a irritation in his throat.  Exam is normal.  He has mild hyponatremia (sodium 130).  Magnesium is normal (1.7).  Plan: 1.  Labs today:  CBC with diff, CMP, Mg. 2.  Rx:  guaifenesin-codeine syrup. 3.  Discuss fluids with electrolytes. 4.  Follow-up with cardiology on 07/23/2016. 5.  RTC as previously scheduled.   Lequita Asal, MD  07/05/2016, 1:52 PM

## 2016-07-06 ENCOUNTER — Telehealth: Payer: Self-pay | Admitting: *Deleted

## 2016-07-06 MED ORDER — GUAIFENESIN-CODEINE 100-10 MG/5ML PO SYRP: 5.0000 mL | ORAL_SOLUTION | Freq: Three times a day (TID) | ORAL | 0 refills | Status: DC | PRN

## 2016-07-06 NOTE — Telephone Encounter (Signed)
Pt will rtn to work tom.  Still with cough and tessalon pearls not working at all, he woul dlike the ocugh med that she spoke about yest. Called in. Checked with [place] and she would like cheratussin and it was called into his pharmacy in [place]. Pt knows med called in.

## 2016-07-23 ENCOUNTER — Inpatient Hospital Stay (HOSPITAL_BASED_OUTPATIENT_CLINIC_OR_DEPARTMENT_OTHER): Payer: BLUE CROSS/BLUE SHIELD | Admitting: Hematology and Oncology

## 2016-07-23 ENCOUNTER — Other Ambulatory Visit: Payer: Self-pay | Admitting: *Deleted

## 2016-07-23 ENCOUNTER — Inpatient Hospital Stay: Payer: BLUE CROSS/BLUE SHIELD

## 2016-07-23 VITALS — BP 159/77 | HR 79 | Temp 96.1°F | Resp 18 | Wt 251.2 lb

## 2016-07-23 DIAGNOSIS — C6211 Malignant neoplasm of descended right testis: Secondary | ICD-10-CM

## 2016-07-23 DIAGNOSIS — E871 Hypo-osmolality and hyponatremia: Secondary | ICD-10-CM

## 2016-07-23 DIAGNOSIS — D649 Anemia, unspecified: Secondary | ICD-10-CM | POA: Diagnosis not present

## 2016-07-23 DIAGNOSIS — R740 Nonspecific elevation of levels of transaminase and lactic acid dehydrogenase [LDH]: Secondary | ICD-10-CM | POA: Diagnosis not present

## 2016-07-23 DIAGNOSIS — Z79899 Other long term (current) drug therapy: Secondary | ICD-10-CM

## 2016-07-23 DIAGNOSIS — H905 Unspecified sensorineural hearing loss: Secondary | ICD-10-CM | POA: Diagnosis not present

## 2016-07-23 DIAGNOSIS — C6291 Malignant neoplasm of right testis, unspecified whether descended or undescended: Secondary | ICD-10-CM | POA: Diagnosis not present

## 2016-07-23 DIAGNOSIS — R05 Cough: Secondary | ICD-10-CM

## 2016-07-23 LAB — LACTATE DEHYDROGENASE: LDH: 140 U/L (ref 98–192)

## 2016-07-23 LAB — COMPREHENSIVE METABOLIC PANEL
ALT: 17 U/L (ref 17–63)
AST: 20 U/L (ref 15–41)
Albumin: 3.9 g/dL (ref 3.5–5.0)
Alkaline Phosphatase: 42 U/L (ref 38–126)
Anion gap: 7 (ref 5–15)
BUN: 9 mg/dL (ref 6–20)
CO2: 24 mmol/L (ref 22–32)
Calcium: 8.3 mg/dL — ABNORMAL LOW (ref 8.9–10.3)
Chloride: 98 mmol/L — ABNORMAL LOW (ref 101–111)
Creatinine, Ser: 0.72 mg/dL (ref 0.61–1.24)
GFR calc Af Amer: >60 mL/min (ref >60)
GFR calc non Af Amer: >60 mL/min (ref >60)
Glucose, Bld: 259 mg/dL — ABNORMAL HIGH (ref 65–99)
Potassium: 3.7 mmol/L (ref 3.5–5.1)
Sodium: 129 mmol/L — ABNORMAL LOW (ref 135–145)
Total Bilirubin: 0.4 mg/dL (ref 0.3–1.2)
Total Protein: 6.9 g/dL (ref 6.5–8.1)

## 2016-07-23 LAB — CBC WITH DIFFERENTIAL/PLATELET
Basophils Absolute: 0.1 10*3/uL (ref 0–0.1)
Basophils Relative: 1 %
Eosinophils Absolute: 0.4 10*3/uL (ref 0–0.7)
Eosinophils Relative: 6 %
HCT: 31.2 % — ABNORMAL LOW (ref 40.0–52.0)
Hemoglobin: 11.2 g/dL — ABNORMAL LOW (ref 13.0–18.0)
Lymphocytes Relative: 20 %
Lymphs Abs: 1.3 10*3/uL (ref 1.0–3.6)
MCH: 33.9 pg (ref 26.0–34.0)
MCHC: 35.9 g/dL (ref 32.0–36.0)
MCV: 94.6 fL (ref 80.0–100.0)
Monocytes Absolute: 0.5 10*3/uL (ref 0.2–1.0)
Monocytes Relative: 8 %
Neutro Abs: 4.4 10*3/uL (ref 1.4–6.5)
Neutrophils Relative %: 65 %
Platelets: 172 10*3/uL (ref 150–440)
RBC: 3.3 MIL/uL — ABNORMAL LOW (ref 4.40–5.90)
RDW: 15.1 % — ABNORMAL HIGH (ref 11.5–14.5)
WBC: 6.7 10*3/uL (ref 3.8–10.6)

## 2016-07-23 LAB — MAGNESIUM: Magnesium: 1.5 mg/dL — ABNORMAL LOW (ref 1.7–2.4)

## 2016-07-23 MED ORDER — MAGNESIUM OXIDE 400 MG PO TABS: 400.0000 mg | ORAL_TABLET | Freq: Two times a day (BID) | ORAL | 1 refills | Status: DC

## 2016-07-23 NOTE — Progress Notes (Addendum)
[facility] day:  07/23/16  Chief Complaint: Nathan Calderon is a 55 y.o. male with stage IB right testicular cancer who is seen for assessment following 2 cycles of BEP chemotherapy.  HPI:  The patient was last seen in the medical oncology clinic on 07/05/2016.  At that time, he was seen for sick call visit for a persistent cough.  He was given a prescription for a cough medication.  He only took it 3 times (it didn't help).  He still has a tickle inn the back of his throat every now and then.  The heat causes him to cough.  He missed his cardiology appointment.  He has been working full time.  This week he worked 40 hours, last week 32 hours, and the week before he worked 18 hours.  He denies any concerns.  Past Medical History:  Diagnosis Date  . Angina    2006, had cardiac cath at the time  . Anxiety   . Diabetes mellitus   . Headache(784.0)   . Hyperlipidemia   . Hypertension   . Kidney stone    hx of  . Sleep apnea   . Testicular cancer ([place]) 04/08/2016    Past Surgical History:  Procedure Laterality Date  . EYE SURGERY     x 2  . HERNIA REPAIR     with undecended testicle  . LASIK    . ORCHIECTOMY Right 04/08/2016   Procedure: ORCHIECTOMY/ INGUINAL APPROACH;  Surgeon: [name], MD;  Location: [facility];  Service: Urology;  Laterality: Right;  . PARS PLANA VITRECTOMY  04/04/2012   Procedure: PARS PLANA VITRECTOMY WITH 25 GAUGE;  Surgeon: [name], MD;  Location: [facility];  Service: Ophthalmology;  Laterality: Left;  with laser  . SHOULDER SURGERY     left shoulder  . VASECTOMY Left 04/08/2016   Procedure: VASECTOMY;  Surgeon: [name], MD;  Location: [facility];  Service: Urology;  Laterality: Left;    Family History  Problem Relation Age of Onset  . Kidney disease Neg Hx   . Prostate cancer Neg Hx   . Diabetes Mother   . Heart disease Mother   . Hypertension Mother   . Heart disease Father   . Ovarian  cancer Maternal Grandmother     Social History:  reports that he has never smoked. He has never used smokeless tobacco. He reports that he drinks alcohol. He reports that he does not use drugs.  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  He worked 40 hours this past week.  He notes that his "insurance resets in 08/2016).  The patient is alone today.  Allergies: No Known Allergies  Current Medications: Current Outpatient Prescriptions  Medication Sig Dispense Refill  . ALPRAZolam (XANAX) 0.5 MG tablet Take 0.5 mg by mouth every 6 (six) hours as needed. for anxiety  5  . amLODipine (NORVASC) 10 MG tablet Take 10 mg by mouth daily.  5  . aspirin EC 81 MG tablet Take 81 mg by mouth daily.    Marland Kitchen atorvastatin (LIPITOR) 20 MG tablet Take 20 mg by mouth daily.    Marland Kitchen BAYER CONTOUR NEXT TEST test strip TEST TWO TIMES DAILY  3  . benzonatate (TESSALON) 100 MG capsule Take  1 capsule (100 mg total) by mouth 3 (three) times daily as needed for cough. 30 capsule 0  . docusate sodium (COLACE) 100 MG capsule Take 100 mg by mouth 2 (two) times daily. Reported on 05/24/2016  0  . glimepiride (AMARYL) 2 MG tablet TAKE 1 TABLET BY MOUTH DAILY WITH FIRST MEAL  11  . guaiFENesin-codeine (CHERATUSSIN AC) 100-10 MG/5ML syrup Take 5 mLs by mouth 3 (three) times daily as needed for cough. 180 mL 0  . Insulin Glargine (TOUJEO SOLOSTAR) 300 UNIT/ML SOPN Inject 65 Units into the skin every evening.    Marland Kitchen losartan (COZAAR) 100 MG tablet Take 100 mg by mouth daily.    . magnesium oxide (MAG-OX) 400 MG tablet Take 1 tablet (400 mg total) by mouth 2 (two) times daily. For 15 days 30 tablet 0  . metFORMIN (GLUCOPHAGE) 1000 MG tablet Take 1,000 mg by mouth 2 (two) times daily with a meal.    . MUSE 250 MCG pellet Reported on 04/23/2016  1  . ondansetron  (ZOFRAN) 8 MG tablet Take 1 tablet (8 mg total) by mouth every 8 (eight) hours as needed for nausea or vomiting. 20 tablet 0  . prochlorperazine (COMPAZINE) 10 MG tablet Take 1 tablet (10 mg total) by mouth every 6 (six) hours as needed for nausea or vomiting. 30 tablet 1   No current facility-administered medications for this visit.    Facility-Administered Medications Ordered in Other Visits  Medication Dose Route Frequency Provider Last Rate Last Dose  . 0.9 %  sodium chloride infusion   Intravenous Once Lequita Asal, MD      . 0.9 %  sodium chloride infusion   Intravenous Once Lequita Asal, MD      . bleomycin (BLEOCIN) 30 Units in sodium chloride 0.9 % 50 mL chemo infusion  30 Units Intravenous Once Lequita Asal, MD      . magnesium sulfate IVPB 2 g 50 mL  2 g Intravenous Once Lequita Asal, MD      . ondansetron (ZOFRAN) 8 mg in sodium chloride 0.9 % 50 mL IVPB   Intravenous Once Lequita Asal, MD      . sodium chloride 0.9 % injection 10 mL  10 mL Intracatheter PRN Lequita Asal, MD        Review of Systems:  GENERAL:  Feels good.  No fever or sweats.  Weigh up 8 pounds. PERFORMANCE STATUS (ECOG):  1-2 HEENT:  No visual changes, runny nose, sore throat, mouth sores or tenderness. Lungs:  No shortness of breath.  Tickle in throat causes a cough.  No hemoptysis. Cardiac:  No chest pain, palpitations, orthopnea, or PND. GI:   No nausea, vomiting, constipation, melena or hematochezia. GU:  No urgency, frequency, dysuria, or hematuria. Musculoskeletal:  No back pain.  No joint pain.  No muscle tenderness. Extremities:  No pain or swelling. Skin:  No rashes or skin changes. Neuro:  No headache, numbness or weakness, balance or coordination issues. Endocrine:  Diabetes.  No thyroid issues, hot flashes or night sweats. Psych:  No mood changes, depression or anxiety. Pain:  No focal pain. Review of systems:  All other systems reviewed and found to be  negative.  Physical Exam: Blood pressure (!) 159/77, pulse 79, temperature (!) 96.1 F (35.6 C), temperature source Tympanic, resp. rate 18, weight 251 lb 3.4 oz (114 kg).  GENERAL:  Well developed, well nourished, gentleman sitting comfortably in the exam room in no acute  distress. MENTAL STATUS:  Alert and oriented to person, place and time. HEAD:  Alopecia.  Slight gray mustache. Normocephalic, atraumatic, face symmetric, no Cushingoid features. EYES:  Glasses.  Brown eyes.  No conjunctivitis or scleral icterus. RESPIRATORY:  Clear to auscultation without rales, wheezes or rhonchi. CARDIOVASCULAR:  Regular rate and rhythm without murmur, rub or gallop. ABDOMEN:  Soft, non-tender, with active bowel sounds, and no appreciable hepatosplenomegaly.  No masses. GENITOURINARY:  Normal penis.  s/p right orchiectomy with right inguinal scarring. SKIN:  No rashes, ulcers or lesions. LYMPH NODES:  No palpable cervical, supraclavicular, axillary or inguinal adenopathy. EXTREMITIES: No edema, skin discoloration or tenderness.  No palpable cords. NEUROLOGICAL: Unremarkable. PSYCH:  Appropriate.   Appointment on 07/23/2016  Component Date Value Ref Range Status  . WBC 07/23/2016 6.7  3.8 - 10.6 K/uL Final  . RBC 07/23/2016 3.30* 4.40 - 5.90 MIL/uL Final  . Hemoglobin 07/23/2016 11.2* 13.0 - 18.0 g/dL Final  . HCT 07/23/2016 31.2* 40.0 - 52.0 % Final  . MCV 07/23/2016 94.6  80.0 - 100.0 fL Final  . MCH 07/23/2016 33.9  26.0 - 34.0 pg Final  . MCHC 07/23/2016 35.9  32.0 - 36.0 g/dL Final  . RDW 07/23/2016 15.1* 11.5 - 14.5 % Final  . Platelets 07/23/2016 172  150 - 440 K/uL Final  . Neutrophils Relative % 07/23/2016 65  % Final  . Neutro Abs 07/23/2016 4.4  1.4 - 6.5 K/uL Final  . Lymphocytes Relative 07/23/2016 20  % Final  . Lymphs Abs 07/23/2016 1.3  1.0 - 3.6 K/uL Final  . Monocytes Relative 07/23/2016 8  % Final  . Monocytes Absolute 07/23/2016 0.5  0.2 - 1.0 K/uL Final  . Eosinophils  Relative 07/23/2016 6  % Final  . Eosinophils Absolute 07/23/2016 0.4  0 - 0.7 K/uL Final  . Basophils Relative 07/23/2016 1  % Final  . Basophils Absolute 07/23/2016 0.1  0 - 0.1 K/uL Final  . Sodium 07/23/2016 129* 135 - 145 mmol/L Final  . Potassium 07/23/2016 3.7  3.5 - 5.1 mmol/L Final  . Chloride 07/23/2016 98* 101 - 111 mmol/L Final  . CO2 07/23/2016 24  22 - 32 mmol/L Final  . Glucose, Bld 07/23/2016 259* 65 - 99 mg/dL Final  . BUN 07/23/2016 9  6 - 20 mg/dL Final  . Creatinine, Ser 07/23/2016 0.72  0.61 - 1.24 mg/dL Final  . Calcium 07/23/2016 8.3* 8.9 - 10.3 mg/dL Final  . Total Protein 07/23/2016 6.9  6.5 - 8.1 g/dL Final  . Albumin 07/23/2016 3.9  3.5 - 5.0 g/dL Final  . AST 07/23/2016 20  15 - 41 U/L Final  . ALT 07/23/2016 17  17 - 63 U/L Final  . Alkaline Phosphatase 07/23/2016 42  38 - 126 U/L Final  . Total Bilirubin 07/23/2016 0.4  0.3 - 1.2 mg/dL Final  . GFR calc non Af Amer 07/23/2016 >60  >60 mL/min Final  . GFR calc Af Amer 07/23/2016 >60  >60 mL/min Final   Comment: (NOTE) The eGFR has been calculated using the CKD EPI equation. This calculation has not been validated in all clinical situations. eGFR's persistently <60 mL/min signify possible Chronic Kidney Disease.   . Anion gap 07/23/2016 7  5 - 15 Final  . LDH 07/23/2016 140  98 - 192 U/L Final  . Magnesium 07/23/2016 1.5* 1.7 - 2.4 mg/dL Final    Assessment:  Nathan Calderon is a 55 y.o. male with stage IB right testicular cancer s/p  orchiectomy on 04/08/2016.  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  There was lymphovascular invasion. Pathologic stage was T2Nx (stage IB).    Abdominal and pelvic CT scan on 04/15/2016 and chest CT on 05/03/2016 revealed no evidence of metastatic disease.   Labs on 04/02/2016 revealed a normal AFP (2.2), beta-hCG (<1) and LDH (203).  Labs on 06/07/2016 revealed a normal AFP (4.9) and beta-hCG  (<1).  PFTs on 05/13/2016 revealed FVC 3.82 liters (75%), FEV1 3.06 liters (84%), TLC 5.5  liters (77%), VC 4.00 liters (79%), and DLCO 28.6 ml/mmHg/min (93%).  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  He received 2 cycles of cycles of BEP (05/17/2016 - 06/07/2016).    Labs on 06/07/2016 revealed an elevated LDH of 450 (98-192).  Follow-up testing on 06/08/2016 revealed a normal CPK (50) and a slightly elevated troponin (0.10).  He was seen by cardiology on 06/08/2016.  LDH was 234 on 06/10/2016.  He has a normocytic anemia likely secondary to chemotherapy.  Labs on 05/19/2016 revealed a normal ferritin (186), B12 (350), folate (20.2), and TSH (2.737).  Reticulocyte count was 2.4%.  Hematocrit has improved from 28.0 to 29.3 to 31.2.  CXR on 06/28/2016 revealed no active cardiopulmonary disease.  Symptomatically, he has a chronic cough caused by a irritation in his throat.  Exam is normal.  He has persistent mild hyponatremia (sodium 129).  He has mild hypomagnesemia (1.5).  Plan: 1.  Labs today:  CBC with diff, CMP, Mg, LDH, beta HCG, AFP. 2.  Review follow-up plan:  Clinic evaluation with tumor markers every 3 months (year 1-2), every 6 months (year 3-4), and annual (year 5).  Annuual abdominal/pelvic CT scan (year 1-2).  CXR or chest CT every 6 months (year 1) and annual (year 2). 3.  Discuss magnesium supplementation. 4.  Rx:  magnesium oxide 400 mg BID. 5.  Discuss persistent hyponatremia.  Discuss fluids with electrolytes.  Discuss blood sugar. 6.  Referral to nephrology re: hyponatremia. 7.  Encourage follow-up with cardiology. 8.  RTC in 1 month for labs (BMP, Mg) 9.  RTC in 3 months for MD assessment, labs (CBC with diff, CMP, Mg, LDH, beta-HCG, AFP).  Addendum:  The patient was encouraged to schedule his colonoscopy.   Lequita Asal, MD  07/23/2016, 2:00 PM

## 2016-07-24 ENCOUNTER — Encounter: Payer: Self-pay | Admitting: Hematology and Oncology

## 2016-07-24 LAB — AFP TUMOR MARKER: AFP-Tumor Marker: 1.7 ng/mL (ref 0.0–8.3)

## 2016-07-24 LAB — BETA HCG QUANT (REF LAB): Beta hCG, Tumor Marker: <1 m[IU]/mL (ref 0–3)

## 2016-07-28 ENCOUNTER — Telehealth: Payer: Self-pay | Admitting: Hematology and Oncology

## 2016-07-28 NOTE — Telephone Encounter (Signed)
Called pt and told him that dr [name] feels that it could be post nasal drip based on his sx but because he is on b/p pills and a diabetic he should see PCP because that person would be better at managing a medicine that would take into consideration his diabetes and his b/p med.  Also some b/p meds can cause a cough also and that should be looked at to.  I advised him to call PCP and he says he will

## 2016-07-28 NOTE — Telephone Encounter (Signed)
Last couple of days his coughing has grown much worse. He has "pearls" to take and they don't help him. He also had cherry tussin and it didn't help either because it keeps him awake. Can you please prescribe something else and send to [facility] in [place]. He had to leave work today because he was coughing so much and got a bad headache from the coughing. Coughed ten minutes straight on the way to work. Like a tickle down in his throat and when he gets started he can't stop. Can he try something different that will also help him sleep? If you need to contact him: [phone].

## 2016-07-30 ENCOUNTER — Telehealth: Payer: Self-pay | Admitting: *Deleted

## 2016-07-30 DIAGNOSIS — R05 Cough: Secondary | ICD-10-CM

## 2016-07-30 DIAGNOSIS — R059 Cough, unspecified: Secondary | ICD-10-CM

## 2016-07-30 DIAGNOSIS — C6211 Malignant neoplasm of descended right testis: Secondary | ICD-10-CM

## 2016-07-30 MED ORDER — GUAIFENESIN-CODEINE 100-10 MG/5ML PO SYRP: 5.0000 mL | ORAL_SOLUTION | Freq: Three times a day (TID) | ORAL | 0 refills | Status: DC | PRN

## 2016-07-30 NOTE — Telephone Encounter (Signed)
Pt called stating that he has been able to sleep for last 3 nights using cheratussin cough syrup with xanax. He has ran out of cheratussin and would like refill called in. Spoke to [place] and she is allowing 1 refill of cough syrup. Called pt and let him know that med sent in to pharmacy.

## 2016-08-20 ENCOUNTER — Other Ambulatory Visit: Payer: Self-pay

## 2016-08-20 ENCOUNTER — Inpatient Hospital Stay: Payer: BLUE CROSS/BLUE SHIELD

## 2016-08-20 ENCOUNTER — Inpatient Hospital Stay: Payer: BLUE CROSS/BLUE SHIELD | Attending: Hematology and Oncology

## 2016-08-20 DIAGNOSIS — E871 Hypo-osmolality and hyponatremia: Secondary | ICD-10-CM | POA: Insufficient documentation

## 2016-08-20 DIAGNOSIS — C6211 Malignant neoplasm of descended right testis: Secondary | ICD-10-CM

## 2016-08-20 DIAGNOSIS — C6291 Malignant neoplasm of right testis, unspecified whether descended or undescended: Secondary | ICD-10-CM | POA: Insufficient documentation

## 2016-08-20 DIAGNOSIS — D649 Anemia, unspecified: Secondary | ICD-10-CM | POA: Insufficient documentation

## 2016-08-20 LAB — BASIC METABOLIC PANEL
Anion gap: 12 (ref 5–15)
BUN: 13 mg/dL (ref 6–20)
CO2: 23 mmol/L (ref 22–32)
Calcium: 9.1 mg/dL (ref 8.9–10.3)
Chloride: 97 mmol/L — ABNORMAL LOW (ref 101–111)
Creatinine, Ser: 0.98 mg/dL (ref 0.61–1.24)
GFR calc Af Amer: >60 mL/min (ref >60)
GFR calc non Af Amer: >60 mL/min (ref >60)
Glucose, Bld: 218 mg/dL — ABNORMAL HIGH (ref 65–99)
Potassium: 4.3 mmol/L (ref 3.5–5.1)
Sodium: 132 mmol/L — ABNORMAL LOW (ref 135–145)

## 2016-08-20 LAB — MAGNESIUM: Magnesium: 1.9 mg/dL (ref 1.7–2.4)

## 2016-09-20 ENCOUNTER — Telehealth: Payer: Self-pay | Admitting: *Deleted

## 2016-09-20 NOTE — Telephone Encounter (Signed)
Pt called to say that the pt has been terminated from his job due to too many absences during chemo.  He has insurance until 10/31.  His next appt is 11/17. He would like to ove the appt up to 10/31 so that it is covered with his insurance and then by the nxt visit he hopes to have a job by then.  I told him I would check with [name] and let him know.

## 2016-09-21 ENCOUNTER — Other Ambulatory Visit: Payer: Self-pay | Admitting: *Deleted

## 2016-09-21 ENCOUNTER — Other Ambulatory Visit: Payer: Self-pay | Admitting: Hematology and Oncology

## 2016-09-21 DIAGNOSIS — C6211 Malignant neoplasm of descended right testis: Secondary | ICD-10-CM

## 2016-09-21 NOTE — Telephone Encounter (Signed)
  Sounds good.  M  

## 2016-09-21 NOTE — Telephone Encounter (Signed)
Called pt back and md wants scan 10/30 and labs and see her 10/31. Pt given times and dates but he is driving and wants me to send it to his email. I have printed it off and scanned it into email to send to pt.

## 2016-09-27 ENCOUNTER — Ambulatory Visit
Admission: RE | Admit: 2016-09-27 | Discharge: 2016-09-27 | Disposition: A | Discharge status: Home / Self Care | Payer: BLUE CROSS/BLUE SHIELD | Source: Ambulatory Visit | Attending: Hematology and Oncology | Admitting: Hematology and Oncology

## 2016-09-27 DIAGNOSIS — K760 Fatty (change of) liver, not elsewhere classified: Secondary | ICD-10-CM | POA: Diagnosis not present

## 2016-09-27 DIAGNOSIS — I2510 Atherosclerotic heart disease of native coronary artery without angina pectoris: Secondary | ICD-10-CM | POA: Diagnosis not present

## 2016-09-27 DIAGNOSIS — R590 Localized enlarged lymph nodes: Secondary | ICD-10-CM | POA: Insufficient documentation

## 2016-09-27 DIAGNOSIS — C6211 Malignant neoplasm of descended right testis: Secondary | ICD-10-CM | POA: Diagnosis present

## 2016-09-28 ENCOUNTER — Inpatient Hospital Stay: Payer: BLUE CROSS/BLUE SHIELD

## 2016-09-28 ENCOUNTER — Inpatient Hospital Stay: Payer: BLUE CROSS/BLUE SHIELD | Attending: Hematology and Oncology | Admitting: Hematology and Oncology

## 2016-09-28 ENCOUNTER — Ambulatory Visit
Admission: RE | Admit: 2016-09-28 | Discharge: 2016-09-28 | Disposition: A | Discharge status: Home / Self Care | Payer: BLUE CROSS/BLUE SHIELD | Source: Ambulatory Visit | Attending: Hematology and Oncology | Admitting: Hematology and Oncology

## 2016-09-28 ENCOUNTER — Telehealth: Payer: Self-pay | Admitting: *Deleted

## 2016-09-28 ENCOUNTER — Other Ambulatory Visit: Payer: Self-pay | Admitting: *Deleted

## 2016-09-28 VITALS — BP 165/83 | HR 91 | Temp 98.3°F | Wt 252.9 lb

## 2016-09-28 DIAGNOSIS — E871 Hypo-osmolality and hyponatremia: Secondary | ICD-10-CM

## 2016-09-28 DIAGNOSIS — C6211 Malignant neoplasm of descended right testis: Secondary | ICD-10-CM

## 2016-09-28 DIAGNOSIS — N200 Calculus of kidney: Secondary | ICD-10-CM | POA: Diagnosis not present

## 2016-09-28 DIAGNOSIS — I10 Essential (primary) hypertension: Secondary | ICD-10-CM | POA: Diagnosis not present

## 2016-09-28 DIAGNOSIS — E785 Hyperlipidemia, unspecified: Secondary | ICD-10-CM | POA: Diagnosis not present

## 2016-09-28 DIAGNOSIS — H905 Unspecified sensorineural hearing loss: Secondary | ICD-10-CM | POA: Insufficient documentation

## 2016-09-28 DIAGNOSIS — R740 Nonspecific elevation of levels of transaminase and lactic acid dehydrogenase [LDH]: Secondary | ICD-10-CM

## 2016-09-28 DIAGNOSIS — R599 Enlarged lymph nodes, unspecified: Secondary | ICD-10-CM

## 2016-09-28 DIAGNOSIS — C6291 Malignant neoplasm of right testis, unspecified whether descended or undescended: Secondary | ICD-10-CM | POA: Insufficient documentation

## 2016-09-28 DIAGNOSIS — R591 Generalized enlarged lymph nodes: Secondary | ICD-10-CM | POA: Diagnosis present

## 2016-09-28 DIAGNOSIS — Z9079 Acquired absence of other genital organ(s): Secondary | ICD-10-CM | POA: Insufficient documentation

## 2016-09-28 DIAGNOSIS — Z79899 Other long term (current) drug therapy: Secondary | ICD-10-CM | POA: Diagnosis not present

## 2016-09-28 DIAGNOSIS — G4730 Sleep apnea, unspecified: Secondary | ICD-10-CM | POA: Insufficient documentation

## 2016-09-28 DIAGNOSIS — K449 Diaphragmatic hernia without obstruction or gangrene: Secondary | ICD-10-CM | POA: Insufficient documentation

## 2016-09-28 DIAGNOSIS — K760 Fatty (change of) liver, not elsewhere classified: Secondary | ICD-10-CM | POA: Diagnosis not present

## 2016-09-28 DIAGNOSIS — Z87442 Personal history of urinary calculi: Secondary | ICD-10-CM | POA: Insufficient documentation

## 2016-09-28 DIAGNOSIS — D649 Anemia, unspecified: Secondary | ICD-10-CM | POA: Diagnosis not present

## 2016-09-28 DIAGNOSIS — Z794 Long term (current) use of insulin: Secondary | ICD-10-CM | POA: Insufficient documentation

## 2016-09-28 DIAGNOSIS — F419 Anxiety disorder, unspecified: Secondary | ICD-10-CM | POA: Diagnosis not present

## 2016-09-28 DIAGNOSIS — Z7982 Long term (current) use of aspirin: Secondary | ICD-10-CM | POA: Insufficient documentation

## 2016-09-28 DIAGNOSIS — E119 Type 2 diabetes mellitus without complications: Secondary | ICD-10-CM | POA: Insufficient documentation

## 2016-09-28 DIAGNOSIS — I7090 Unspecified atherosclerosis: Secondary | ICD-10-CM | POA: Insufficient documentation

## 2016-09-28 LAB — COMPREHENSIVE METABOLIC PANEL
ALT: 30 U/L (ref 17–63)
AST: 27 U/L (ref 15–41)
Albumin: 4.1 g/dL (ref 3.5–5.0)
Alkaline Phosphatase: 51 U/L (ref 38–126)
Anion gap: 8 (ref 5–15)
BUN: 13 mg/dL (ref 6–20)
CO2: 27 mmol/L (ref 22–32)
Calcium: 9 mg/dL (ref 8.9–10.3)
Chloride: 98 mmol/L — ABNORMAL LOW (ref 101–111)
Creatinine, Ser: 1.05 mg/dL (ref 0.61–1.24)
GFR calc Af Amer: >60 mL/min (ref >60)
GFR calc non Af Amer: >60 mL/min (ref >60)
Glucose, Bld: 238 mg/dL — ABNORMAL HIGH (ref 65–99)
Potassium: 3.8 mmol/L (ref 3.5–5.1)
Sodium: 133 mmol/L — ABNORMAL LOW (ref 135–145)
Total Bilirubin: 0.5 mg/dL (ref 0.3–1.2)
Total Protein: 7.5 g/dL (ref 6.5–8.1)

## 2016-09-28 LAB — CBC WITH DIFFERENTIAL/PLATELET
Basophils Absolute: 0 10*3/uL (ref 0–0.1)
Basophils Relative: 1 %
Eosinophils Absolute: 0.3 10*3/uL (ref 0–0.7)
Eosinophils Relative: 4 %
HCT: 39.1 % — ABNORMAL LOW (ref 40.0–52.0)
Hemoglobin: 14 g/dL (ref 13.0–18.0)
Lymphocytes Relative: 22 %
Lymphs Abs: 1.4 10*3/uL (ref 1.0–3.6)
MCH: 32.7 pg (ref 26.0–34.0)
MCHC: 35.7 g/dL (ref 32.0–36.0)
MCV: 91.5 fL (ref 80.0–100.0)
Monocytes Absolute: 0.4 10*3/uL (ref 0.2–1.0)
Monocytes Relative: 7 %
Neutro Abs: 4.3 10*3/uL (ref 1.4–6.5)
Neutrophils Relative %: 66 %
Platelets: 214 10*3/uL (ref 150–440)
RBC: 4.28 MIL/uL — ABNORMAL LOW (ref 4.40–5.90)
RDW: 12.3 % (ref 11.5–14.5)
WBC: 6.4 10*3/uL (ref 3.8–10.6)

## 2016-09-28 LAB — LACTATE DEHYDROGENASE: LDH: 157 U/L (ref 98–192)

## 2016-09-28 LAB — MAGNESIUM: Magnesium: 1.6 mg/dL — ABNORMAL LOW (ref 1.7–2.4)

## 2016-09-28 MED ORDER — IOPAMIDOL (ISOVUE-370) INJECTION 76%
100.0000 mL | Freq: Once | INTRAVENOUS | Status: AC | PRN
  Administered 2016-09-28: 100 mL via INTRAVENOUS

## 2016-09-28 MED ORDER — MAGNESIUM OXIDE 400 MG PO TABS: 400.0000 mg | ORAL_TABLET | Freq: Two times a day (BID) | ORAL | 1 refills | Status: DC

## 2016-09-28 NOTE — Progress Notes (Signed)
Patient is out of Magnesium.  If he needs to continue taking, will need refill today.  Patient offers no complaints today.  BP elevated.  177/81  HR 101.  Recheck 165/83  HR 91.

## 2016-09-28 NOTE — Telephone Encounter (Signed)
Called patient to inform him that his CT of chest, abdomen and pelvis was normal.  MD awaiting results on tumor markers.  Will also review with radiology.  Most likely will repeat CT in 3 months.  Patient verbalized understanding and grateful for call.

## 2016-09-28 NOTE — Progress Notes (Signed)
[facility] day:  09/28/16  Chief Complaint: Nathan Calderon is a 55 y.o. male with stage IB right testicular cancer who is seen for review of interval chest CT and assessment following 2 cycles of BEP chemotherapy.  HPI:  The patient was last seen in the medical oncology clinic on 07/23/2016.  At that time, he noted a chronic cough caused by a irritation in his throat.  Exam was normal.  He had persistent mild hyponatremia (sodium 129).  He had mild hypomagnesemia (1.5).  He was on oral magnesium supplementation.  He was referred to nephrology for chronic hyponatremia.  He was to follow-up with cardiology.    Chest CT without contrast on 09/27/2016 revealed interval enlargement of multiple small mediastinal, hilar and upper abdominal lymph nodes since previous CT of less than 5 months ago. These lymph nodes were nonspecific, and potentially reactive.  Metastatic disease could not be excluded.  There were no suspicious pulmonary findings.  There was hepatic steatosis.  Symptomatically, he has had no problems.  He ran out of his magnesium 2 weeks ago. He has not gotten around to his colonoscopy. He still has a slight cough.  He has some indigestion.   Past Medical History:  Diagnosis Date  . Angina    2006, had cardiac cath at the time  . Anxiety   . Diabetes mellitus   . Headache(784.0)   . Hyperlipidemia   . Hypertension   . Kidney stone    hx of  . Sleep apnea   . Testicular cancer ([place]) 04/08/2016    Past Surgical History:  Procedure Laterality Date  . EYE SURGERY     x 2  . HERNIA REPAIR     with undecended testicle  . LASIK    . ORCHIECTOMY Right 04/08/2016   Procedure: ORCHIECTOMY/ INGUINAL APPROACH;  Surgeon: [name], MD;  Location: [facility];  Service: Urology;  Laterality: Right;  . PARS PLANA VITRECTOMY  04/04/2012   Procedure: PARS PLANA VITRECTOMY WITH 25 GAUGE;  Surgeon: [name], MD;  Location: [facility] [place];  Service:  Ophthalmology;  Laterality: Left;  with laser  . SHOULDER SURGERY     left shoulder  . VASECTOMY Left 04/08/2016   Procedure: VASECTOMY;  Surgeon: [name], MD;  Location: [facility];  Service: Urology;  Laterality: Left;    Family History  Problem Relation Age of Onset  . Kidney disease Neg Hx   . Prostate cancer Neg Hx   . Diabetes Mother   . Heart disease Mother   . Hypertension Mother   . Heart disease Father   . Ovarian cancer Maternal Grandmother     Social History:  reports that he has never smoked. He has never used smokeless tobacco. He reports that he drinks alcohol. He reports that he does not use drugs.  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  He worked 40 hours this past week.  He notes that his "insurance resets in 08/2016).  The patient is alone today.  Allergies: No Known Allergies  Current Medications: Current Outpatient Prescriptions  Medication Sig Dispense Refill  . ALPRAZolam (XANAX) 0.5 MG tablet Take 0.5 mg by mouth every 6 (six) hours as needed. for anxiety  5  . amLODipine (NORVASC)  10 MG tablet Take 10 mg by mouth daily.  5  . aspirin EC 81 MG tablet Take 81 mg by mouth daily.    Marland Kitchen atorvastatin (LIPITOR) 20 MG tablet Take 20 mg by mouth daily.    Marland Kitchen glimepiride (AMARYL) 2 MG tablet TAKE 1 TABLET BY MOUTH DAILY WITH FIRST MEAL  11  . Insulin Glargine (TOUJEO SOLOSTAR) 300 UNIT/ML SOPN Inject 65 Units into the skin every evening.    Marland Kitchen losartan (COZAAR) 100 MG tablet Take 100 mg by mouth daily.    . metFORMIN (GLUCOPHAGE) 1000 MG tablet Take 1,000 mg by mouth 2 (two) times daily with a meal.    . MUSE 250 MCG pellet Reported on 04/23/2016  1  . magnesium oxide (MAG-OX) 400 MG tablet Take 1 tablet (400 mg total) by mouth 2 (two) times daily. (Patient not taking: Reported on 09/28/2016)  60 tablet 1  . TRULICITY 6.65 LD/3.5TS SOPN INJECT 1 ML UNDER SKIN SUBCUTANEOUSLY ONCE A WEEK  12   No current facility-administered medications for this visit.    Facility-Administered Medications Ordered in Other Visits  Medication Dose Route Frequency Provider Last Rate Last Dose  . 0.9 %  sodium chloride infusion   Intravenous Once Lequita Asal, MD      . 0.9 %  sodium chloride infusion   Intravenous Once Lequita Asal, MD      . bleomycin (BLEOCIN) 30 Units in sodium chloride 0.9 % 50 mL chemo infusion  30 Units Intravenous Once Lequita Asal, MD      . magnesium sulfate IVPB 2 g 50 mL  2 g Intravenous Once Lequita Asal, MD      . ondansetron (ZOFRAN) 8 mg in sodium chloride 0.9 % 50 mL IVPB   Intravenous Once Lequita Asal, MD      . sodium chloride 0.9 % injection 10 mL  10 mL Intracatheter PRN Lequita Asal, MD        Review of Systems:  GENERAL:  Feels good.  No fever or sweats.  Weigh up 1 pound. PERFORMANCE STATUS (ECOG):  1 HEENT:  No visual changes, runny nose, sore throat, mouth sores or tenderness. Lungs:  No shortness of breath.  Tickle in throat causes a cough.  No hemoptysis. Cardiac:  No chest pain, palpitations, orthopnea, or PND. GI:   Indigestion.  No nausea, vomiting, constipation, melena or hematochezia.  No colonoscopy. GU:  No urgency, frequency, dysuria, or hematuria. Musculoskeletal:  No back pain.  No joint pain.  No muscle tenderness. Extremities:  No pain or swelling. Skin:  No rashes or skin changes. Neuro:  No headache, numbness or weakness, balance or coordination issues. Endocrine:  Diabetes.  Hgb A1C, improved.  No thyroid issues, hot flashes or night sweats. Psych:  No mood changes, depression or anxiety. Pain:  No focal pain. Review of systems:  All other systems reviewed and found to be negative.  Physical Exam: Blood pressure (!) 165/83, pulse 91, temperature 98.3 F (36.8 C), temperature source Tympanic, weight  252 lb 13.9 oz (114.7 kg).  GENERAL:  Well developed, well nourished, gentleman sitting comfortably in the exam room in no acute distress. MENTAL STATUS:  Alert and oriented to person, place and time. HEAD:  Alopecia.  Gray beard. Normocephalic, atraumatic, face symmetric, no Cushingoid features. EYES:  Glasses.  Brown eyes.  No conjunctivitis or scleral icterus. RESPIRATORY:  Clear to auscultation without rales, wheezes or rhonchi. CARDIOVASCULAR:  Regular rate and rhythm without  murmur, rub or gallop. ABDOMEN:  Soft, non-tender, with active bowel sounds, and no appreciable hepatosplenomegaly.  No masses. GENITOURINARY:  Normal penis.  s/p right orchiectomy with right inguinal scarring. SKIN:  No rashes, ulcers or lesions. LYMPH NODES:  No palpable cervical, supraclavicular, axillary or inguinal adenopathy. EXTREMITIES: No edema, skin discoloration or tenderness.  No palpable cords. NEUROLOGICAL: Unremarkable. PSYCH:  Appropriate.   Appointment on 09/28/2016  Component Date Value Ref Range Status  . WBC 09/28/2016 6.4  3.8 - 10.6 K/uL Final  . RBC 09/28/2016 4.28* 4.40 - 5.90 MIL/uL Final  . Hemoglobin 09/28/2016 14.0  13.0 - 18.0 g/dL Final  . HCT 09/28/2016 39.1* 40.0 - 52.0 % Final  . MCV 09/28/2016 91.5  80.0 - 100.0 fL Final  . MCH 09/28/2016 32.7  26.0 - 34.0 pg Final  . MCHC 09/28/2016 35.7  32.0 - 36.0 g/dL Final  . RDW 09/28/2016 12.3  11.5 - 14.5 % Final  . Platelets 09/28/2016 214  150 - 440 K/uL Final  . Neutrophils Relative % 09/28/2016 66  % Final  . Neutro Abs 09/28/2016 4.3  1.4 - 6.5 K/uL Final  . Lymphocytes Relative 09/28/2016 22  % Final  . Lymphs Abs 09/28/2016 1.4  1.0 - 3.6 K/uL Final  . Monocytes Relative 09/28/2016 7  % Final  . Monocytes Absolute 09/28/2016 0.4  0.2 - 1.0 K/uL Final  . Eosinophils Relative 09/28/2016 4  % Final  . Eosinophils Absolute 09/28/2016 0.3  0 - 0.7 K/uL Final  . Basophils Relative 09/28/2016 1  % Final  . Basophils Absolute  09/28/2016 0.0  0 - 0.1 K/uL Final  . Sodium 09/28/2016 133* 135 - 145 mmol/L Final  . Potassium 09/28/2016 3.8  3.5 - 5.1 mmol/L Final  . Chloride 09/28/2016 98* 101 - 111 mmol/L Final  . CO2 09/28/2016 27  22 - 32 mmol/L Final  . Glucose, Bld 09/28/2016 238* 65 - 99 mg/dL Final  . BUN 09/28/2016 13  6 - 20 mg/dL Final  . Creatinine, Ser 09/28/2016 1.05  0.61 - 1.24 mg/dL Final  . Calcium 09/28/2016 9.0  8.9 - 10.3 mg/dL Final  . Total Protein 09/28/2016 7.5  6.5 - 8.1 g/dL Final  . Albumin 09/28/2016 4.1  3.5 - 5.0 g/dL Final  . AST 09/28/2016 27  15 - 41 U/L Final  . ALT 09/28/2016 30  17 - 63 U/L Final  . Alkaline Phosphatase 09/28/2016 51  38 - 126 U/L Final  . Total Bilirubin 09/28/2016 0.5  0.3 - 1.2 mg/dL Final  . GFR calc non Af Amer 09/28/2016 >60  >60 mL/min Final  . GFR calc Af Amer 09/28/2016 >60  >60 mL/min Final   Comment: (NOTE) The eGFR has been calculated using the CKD EPI equation. This calculation has not been validated in all clinical situations. eGFR's persistently <60 mL/min signify possible Chronic Kidney Disease.   . Anion gap 09/28/2016 8  5 - 15 Final  . Magnesium 09/28/2016 1.6* 1.7 - 2.4 mg/dL Final  . LDH 09/28/2016 157  98 - 192 U/L Final    Assessment:  Nathan Calderon is a 55 y.o. male with stage IB right testicular cancer s/p orchiectomy on 04/08/2016.  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  There was lymphovascular invasion. Pathologic stage was T2Nx (stage IB).    Abdominal and pelvic CT scan on 04/15/2016 and chest CT on 05/03/2016 revealed no evidence of metastatic  disease.   Labs on 04/02/2016 revealed a normal AFP (2.2), beta-hCG (<1) and LDH (203).  Labs on 06/07/2016 revealed a normal AFP (4.9) and beta-hCG (<1).  PFTs on 05/13/2016 revealed FVC 3.82 liters (75%), FEV1 3.06 liters (84%), TLC 5.5  liters (77%), VC 4.00 liters (79%), and DLCO 28.6 ml/mmHg/min (93%).   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  He received 2 cycles of cycles of BEP (05/17/2016 - 06/07/2016).    Labs on 06/07/2016 revealed an elevated LDH of 450 (98-192).  Follow-up testing on 06/08/2016 revealed a normal CPK (50) and a slightly elevated troponin (0.10).  He was seen by cardiology on 06/08/2016.  LDH was 234 on 06/10/2016.  CXR on 06/28/2016 revealed no active cardiopulmonary disease.  Chest CT without contrast on 09/27/2016 revealed interval enlargement of multiple small mediastinal, hilar and upper abdominal lymph nodes.  Lymph nodes included paratracheal (7 mm to 12 mm), AP window node (6 mm to 10 mm), subcarinal (7 mm to 11 mm), hilar (more prominent), gastrohepatic ligament node (10 mm to 12 mm and 9 mm to 10 mm).  There were no suspicious pulmonary findings.  There was hepatic steatosis.  He has a normocytic anemia likely secondary to chemotherapy.  Labs on 05/19/2016 revealed a normal ferritin (186), B12 (350), folate (20.2), and TSH (2.737).  Reticulocyte count was 2.4%.  Hematocrit has improved from 28.0 to 29.3 to 31.2.  Symptomatically, he has a chronic cough caused by a irritation in his throat.  Exam is normal.  His hyponatremia has improved (sodium 131 to 133).  He has mild hypomagnesemia (1.6).  Plan: 1.  Labs today:  CBC with diff, CMP, Mg, LDH, beta HCG, AFP. 2.  Review chest CT. 3.  Schedule abdomen and pelvic CT scan with contrast. 4.  Continue surveillance:  clinic evaluation with tumor markers every 3 months (year 1-2), every 6 months (year 3-4), and annual (year 5).  Annuual abdominal/pelvic CT scan (year 1-2).  CXR or chest CT every 6 months (year 1) and annual (year 2). 5.  Encouraged patient to schedule his colonoscopy. 6.  Social work assistance 7.  Patient to call for follow-up based on insurance  Addendum:  Alpha-fetoprotein was 2.1 (0-8 0.3),  beta-hCG < 1.0 and LDH of 157 (normal),    Lequita Asal, MD  09/28/2016, 11:30 AM

## 2016-09-28 NOTE — Telephone Encounter (Signed)
-----   Message from Lequita Asal, MD sent at 09/28/2016  3:02 PM EDT ----- Regarding: Please notify patient of abdomen/pelvic CT results  Looks good.  Await tumor markers.  Will review small chest adenopathy with radiology.  Suspect will need chest CT in 3 months.  M  ----- Message ----- From: Interface, Rad Results In Sent: 09/28/2016   2:52 PM To: Lequita Asal, MD

## 2016-09-29 LAB — BETA HCG QUANT (REF LAB): Beta hCG, Tumor Marker: <1 m[IU]/mL (ref 0–3)

## 2016-09-29 LAB — AFP TUMOR MARKER: AFP-Tumor Marker: 2.1 ng/mL (ref 0.0–8.3)

## 2016-10-15 ENCOUNTER — Other Ambulatory Visit: Payer: BLUE CROSS/BLUE SHIELD

## 2016-10-15 ENCOUNTER — Inpatient Hospital Stay: Payer: BLUE CROSS/BLUE SHIELD | Attending: Hematology and Oncology

## 2016-10-15 ENCOUNTER — Ambulatory Visit: Payer: BLUE CROSS/BLUE SHIELD | Admitting: Hematology and Oncology

## 2016-11-10 ENCOUNTER — Encounter: Payer: Self-pay | Admitting: Hematology and Oncology

## 2017-02-22 ENCOUNTER — Telehealth: Payer: Self-pay | Admitting: *Deleted

## 2017-02-22 NOTE — Telephone Encounter (Signed)
Called to report that it is time for some testing to be done. His appt in Nov was cancelled by provider and he has no future appts scheduled. Please advise

## 2017-02-22 NOTE — Telephone Encounter (Signed)
  Per note on 10/31:  Surveillance with tumor markers every 3 months (year 1-2), every 6 months (year 3-4), and annual (year 5).   Annual abdominal/pelvic CT scan (year 1-2).   CXR or chest CT every 6 months (year 1) and annual (year 2).  Patient to schedule his colonoscopy. Patient to call for follow-up based on insurance  He did not want to schedule any follow-up back then.  If he would like to reinitiate follow-up: Next available:  MD visit, labs (CBC with diff, CMP, AFP, beta-HCG, LDH). We can set him up for a chest CT either before or after his visit.  M

## 2017-02-23 ENCOUNTER — Other Ambulatory Visit: Payer: Self-pay | Admitting: *Deleted

## 2017-02-23 DIAGNOSIS — C6211 Malignant neoplasm of descended right testis: Secondary | ICD-10-CM

## 2017-02-23 NOTE — Telephone Encounter (Signed)
tia,   Please make an appt for pt to see md with labs and a CT as Dr. Kem Parkinson note   Thanks,  Leana Roe

## 2017-03-15 ENCOUNTER — Ambulatory Visit (INDEPENDENT_AMBULATORY_CARE_PROVIDER_SITE_OTHER): Payer: BLUE CROSS/BLUE SHIELD | Admitting: Urology

## 2017-03-15 ENCOUNTER — Encounter: Payer: Self-pay | Admitting: Urology

## 2017-03-15 VITALS — BP 176/77 | HR 92 | Ht 73.0 in | Wt 250.0 lb

## 2017-03-15 DIAGNOSIS — C6201 Malignant neoplasm of undescended right testis: Secondary | ICD-10-CM | POA: Diagnosis not present

## 2017-03-15 DIAGNOSIS — Z9852 Vasectomy status: Secondary | ICD-10-CM | POA: Diagnosis not present

## 2017-03-15 DIAGNOSIS — Z87442 Personal history of urinary calculi: Secondary | ICD-10-CM | POA: Diagnosis not present

## 2017-03-15 DIAGNOSIS — N529 Male erectile dysfunction, unspecified: Secondary | ICD-10-CM

## 2017-03-15 NOTE — Progress Notes (Signed)
8:27 AM  03/15/17  Nathan Calderon Jan 18, 1961 [id number]   Chief Complaint  Patient presents with  . Erectile Dysfunction    HPI: 56 year old male who presents today for multiple issues including right testicular cancer, ED, and history of kidney stones.    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 pT2, stage 1B. CT abdomen/ pelvis and chest obtained postoperatively shows no obvious metastatic disease. Tumor markers preoperatively negative.    He is referred to the cancer center is now followed by Nathan Calderon. He underwent 2 cycles of BEP chemotherapy. He is due for tumor markers and restaging. He has an appointment in May 2018.  Erectile dysfunction Refractory to PDE 5 inhibitors. He previously tried intracavernosal injections with fairly good success. He prefers urthrethral suppositories but they don't work as well.  Libido is intact.  He has used trimix In the past under the care of Dr. [name] in [place]. This worked well for him that he cannot recall the dosage that he used. He is interested in pursuing this again.  He is in a new relationship and itching becoming sexually active again. He never had his post vasectomy semen analysis and is requesting to perform this today.         SHIM    Row Name 03/15/17 1608         SHIM: Over the last 6 months:   How do you rate your confidence that you could get and keep an erection? Very Low     When you had erections with sexual stimulation, how often were your erections hard enough for penetration (entering your partner)? A Few Times (much less than half the time)     During sexual intercourse, how often were you able to maintain your erection after you had penetrated (entered) your partner? Almost Never or Never     During sexual intercourse, how difficult  was it to maintain your erection to completion of intercourse? Extremely Difficult     When you attempted sexual intercourse, how often was it satisfactory for you? A Few Times (much less than half the time)       SHIM Total Score   SHIM 7        History of kidney stones History of nonobstructing kidney stones. He has not had abdominal/renal imaging in quite some time. No recent flank pain. No recent gross hematuria.  KUB 03/2016 stable nonobstructing left-sided stones 2, no greater than 5 mm, stable x 10 years.  No recent flank pain.  PMH: Past Medical History:  Diagnosis Date  . Angina    2006, had cardiac cath at the time  . Anxiety   . Diabetes mellitus   . Headache(784.0)   . Hyperlipidemia   . Hypertension   . Kidney stone    hx of  . Sleep apnea   . Testicular cancer ([place]) 04/08/2016    Surgical History: Past Surgical History:  Procedure Laterality Date  . EYE SURGERY     x 2  . HERNIA REPAIR     with undecended testicle  . LASIK    . ORCHIECTOMY Right 04/08/2016   Procedure: ORCHIECTOMY/ INGUINAL APPROACH;  Surgeon: [name], MD;  Location: [facility];  Service: Urology;  Laterality: Right;  . PARS PLANA VITRECTOMY  04/04/2012   Procedure: PARS PLANA VITRECTOMY WITH 25 GAUGE;  Surgeon: [name], MD;  Location: [place] [facility];  Service: Ophthalmology;  Laterality: Left;  with laser  . SHOULDER SURGERY     left shoulder  . VASECTOMY Left 04/08/2016   Procedure: VASECTOMY;  Surgeon: [name], MD;  Location: [facility];  Service: Urology;  Laterality: Left;    Home Medications:  Allergies as of 03/15/2017   No Known Allergies     Medication List       Accurate as of 03/15/17 11:59 PM. Always use your most recent med list.          ALPRAZolam 0.5 MG tablet Commonly known as:  XANAX Take 0.5 mg by mouth every 6 (six) hours as needed. for anxiety   amLODipine 10 MG tablet Commonly known as:  NORVASC Take 10 mg by mouth daily.   aspirin EC 81 MG  tablet Take 81 mg by mouth daily.   atorvastatin 20 MG tablet Commonly known as:  LIPITOR Take 20 mg by mouth daily.   glimepiride 2 MG tablet Commonly known as:  AMARYL TAKE 1 TABLET BY MOUTH DAILY WITH FIRST MEAL   losartan 100 MG tablet Commonly known as:  COZAAR Take 100 mg by mouth daily.   magnesium oxide 400 MG tablet Commonly known as:  MAG-OX Take 1 tablet (400 mg total) by mouth 2 (two) times daily.   metFORMIN 1000 MG tablet Commonly known as:  GLUCOPHAGE Take 1,000 mg by mouth 2 (two) times daily with a meal.   TOUJEO SOLOSTAR 300 UNIT/ML Sopn Generic drug:  Insulin Glargine Inject 65 Units into the skin every evening.       Allergies: No Known Allergies  Family History: Family History  Problem Relation Age of Onset  . Kidney disease Neg Hx   . Prostate cancer Neg Hx   . Diabetes Mother   . Heart disease Mother   . Hypertension Mother   . Heart disease Father   . Ovarian cancer Maternal Grandmother     Social History:  reports that he has never smoked. He has never used smokeless tobacco. He reports that he drinks alcohol. He reports that he does not use drugs.  ROS: UROLOGY Frequent Urination?: No Hard to postpone urination?: No Burning/pain with urination?: No Get up at night to urinate?: No Leakage of urine?: No Urine stream starts and stops?: No Trouble starting stream?: No Do you have to strain to urinate?: No Blood in urine?: No Urinary tract infection?: No Sexually transmitted disease?: No Injury to kidneys or bladder?: No Painful intercourse?: No Weak stream?: No Erection problems?: Yes Penile pain?: No  Gastrointestinal Nausea?: No Vomiting?: No Indigestion/heartburn?: No Diarrhea?: No Constipation?: No  Constitutional Fever: No Night sweats?: No Weight loss?: No Fatigue?: No  Skin Skin rash/lesions?: No Itching?: No  Eyes Blurred vision?: No Double vision?: No  Ears/Nose/Throat Sore throat?: No Sinus  problems?: No  Hematologic/Lymphatic Swollen glands?: No Easy bruising?: No  Cardiovascular Leg swelling?: Yes Chest pain?: No  Respiratory Cough?: No Shortness of breath?: No  Endocrine Excessive thirst?: No  Musculoskeletal Back pain?: No Joint pain?: No  Neurological Headaches?: No Dizziness?: No  Psychologic Depression?: No Anxiety?: No  Physical Exam: BP (!) 176/77   Pulse 92   Ht 6\' 1"  (1.854 m)   Wt 250 lb (113.4 kg)   BMI 32.98 kg/m   Constitutional:  Alert and oriented, No acute distress. HEENT: [place] AT, moist mucus membranes.  Trachea midline, no masses. Cardiovascular: No clubbing, cyanosis, or edema.  Respiratory: Normal respiratory effort, no increased work  of breathing.  GU: Circumcised phallus with orthotopic meatus. Right inguinal incision appreciated, well-healed. Right testicle surgically absent. Left testicle normal, nontender, no masses. Lymph: No inguinal lymphadenopathy bilaterally.  Neurologic: Grossly intact, no focal deficits, moving all 4 extremities. Psychiatric: Normal mood and affect.  Laboratory Data: Lab Results  Component Value Date   WBC 6.4 09/28/2016   HGB 14.0 09/28/2016   HCT 39.1 (L) 09/28/2016   MCV 91.5 09/28/2016   PLT 214 09/28/2016    Lab Results  Component Value Date   CREATININE 1.05 09/28/2016    Urinalysis n/a  Pertinent Imaging: Due for CT/ abd CT  Assessment & Plan:    1. 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 ED Most interested in resuming intracavernosal injections Prescription called into compounding pharmacy, will pick up a prescription and return for test dose and teaching Post vasectomy semen analysis ordered today  3. History of kidney  stones History of kidney stones requiring intervention Will continue to follow clinically   Return for injection teaching with [place] [place].   [name], MD  [facility] [address] [place], [place] [address] [phone]

## 2017-03-17 ENCOUNTER — Other Ambulatory Visit: Payer: Self-pay | Admitting: *Deleted

## 2017-03-17 ENCOUNTER — Telehealth: Payer: Self-pay

## 2017-03-17 DIAGNOSIS — C6211 Malignant neoplasm of descended right testis: Secondary | ICD-10-CM

## 2017-03-17 NOTE — Telephone Encounter (Signed)
Spoke w/ Gwinda Passe at [employer] [facility].  Script for Trimix called in for 10 syringes no refill

## 2017-03-18 ENCOUNTER — Telehealth: Payer: Self-pay

## 2017-03-18 LAB — POST-VAS SPERM EVALUATION,QUAL: Volume: 1.1 mL

## 2017-03-18 NOTE — Telephone Encounter (Signed)
No answer

## 2017-03-18 NOTE — Telephone Encounter (Signed)
Patient returned call and was advised of his results.  Patient voiced understanding.

## 2017-03-18 NOTE — Telephone Encounter (Signed)
-----   Message from [name], MD sent at 03/18/2017  7:51 AM EDT ----- No sperm.    [name], MD

## 2017-04-05 ENCOUNTER — Ambulatory Visit
Admission: RE | Admit: 2017-04-05 | Discharge: 2017-04-05 | Disposition: A | Discharge status: Home / Self Care | Payer: BLUE CROSS/BLUE SHIELD | Source: Ambulatory Visit | Attending: Hematology and Oncology | Admitting: Hematology and Oncology

## 2017-04-05 DIAGNOSIS — R918 Other nonspecific abnormal finding of lung field: Secondary | ICD-10-CM | POA: Diagnosis not present

## 2017-04-05 DIAGNOSIS — C6211 Malignant neoplasm of descended right testis: Secondary | ICD-10-CM | POA: Insufficient documentation

## 2017-04-07 ENCOUNTER — Ambulatory Visit: Payer: BLUE CROSS/BLUE SHIELD | Admitting: Hematology and Oncology

## 2017-04-07 ENCOUNTER — Other Ambulatory Visit: Payer: BLUE CROSS/BLUE SHIELD

## 2017-04-07 ENCOUNTER — Telehealth: Payer: Self-pay | Admitting: *Deleted

## 2017-04-07 NOTE — Telephone Encounter (Signed)
Called Summer RN at [facility] about patients CT scan, sent Ct to PCP and left message for patient that the pcp office would be calling him for an appointment for evaluation and treatment of lung infection

## 2017-04-12 ENCOUNTER — Ambulatory Visit: Payer: BLUE CROSS/BLUE SHIELD | Admitting: Urology

## 2017-04-12 ENCOUNTER — Inpatient Hospital Stay (HOSPITAL_BASED_OUTPATIENT_CLINIC_OR_DEPARTMENT_OTHER): Payer: BLUE CROSS/BLUE SHIELD | Admitting: Hematology and Oncology

## 2017-04-12 ENCOUNTER — Encounter: Payer: Self-pay | Admitting: Hematology and Oncology

## 2017-04-12 ENCOUNTER — Other Ambulatory Visit: Payer: Self-pay | Admitting: *Deleted

## 2017-04-12 ENCOUNTER — Inpatient Hospital Stay: Payer: BLUE CROSS/BLUE SHIELD | Attending: Hematology and Oncology

## 2017-04-12 VITALS — BP 161/71 | HR 87 | Temp 97.6°F | Wt 233.1 lb

## 2017-04-12 DIAGNOSIS — D649 Anemia, unspecified: Secondary | ICD-10-CM | POA: Diagnosis not present

## 2017-04-12 DIAGNOSIS — C6211 Malignant neoplasm of descended right testis: Secondary | ICD-10-CM

## 2017-04-12 DIAGNOSIS — C6290 Malignant neoplasm of unspecified testis, unspecified whether descended or undescended: Secondary | ICD-10-CM

## 2017-04-12 DIAGNOSIS — Z9221 Personal history of antineoplastic chemotherapy: Secondary | ICD-10-CM | POA: Diagnosis not present

## 2017-04-12 NOTE — Progress Notes (Signed)
Nathan Calderon is a 56 y.o. male with stage IB right testicular cancer who is seen for 6 month assessment and review of interval chest CT.  HPI:  The patient was last seen in the medical oncology clinic on 09/28/2016.  At that time, he had a chronic cough.  Exam was normal.  Hyponatremia had improved (sodium 131 to 133).  He had mild hypomagnesemia (1.6).  We discussed the need for colonoscopy.  We discussed continued surveillance.  He had insurance coverage issues.  He saw Dr. [name] on 03/15/2017.  He was seen for erectile dysfunction.  Follow-up surveillance studies were encouraged.  Chest CT without contrast on 04/05/2017 revealed interval development of peribronchovascular nodularity right middle and lower lobes. Imaging features suggested atypical infection.  There was an interval decrease in the mediastinal lymph nodes, now measuring within normal limits for size.  There were borderline upper abdominal lymph nodes stable to slightly decreased in the interval. Continued attention on follow-up was recommended.  Symptomatically, he notes stress.  He and his girlfriend just broke up.  He is making less.  He denies any pain or concerns.   Past Medical History:  Diagnosis Date  . Angina    2006, had cardiac cath at the time  . Anxiety   . Diabetes mellitus    Type 2  . Headache(784.0)   . Heart murmur    as child  . Hyperlipidemia   . Hypertension   . Kidney stone    hx of  . Motion sickness    ocean boats  . Sleep apnea    no CPAP.  Never got it.  . Testicular cancer ([place]) 04/08/2016    Past Surgical History:  Procedure Laterality Date  . CARDIAC CATHETERIZATION  2000   and 2006  . COLONOSCOPY WITH PROPOFOL N/A 05/12/2017   Procedure: COLONOSCOPY WITH PROPOFOL;  Surgeon: Lucilla Lame, MD;  Location: [facility];  Service: Endoscopy;  Laterality: N/A;  Cannot arrive  before 8:45 Diabetic - insulin and oral meds sleep apnea  . EYE SURGERY     x 2  . HERNIA REPAIR     with undecended testicle  . LASIK    . ORCHIECTOMY Right 04/08/2016   Procedure: ORCHIECTOMY/ INGUINAL APPROACH;  Surgeon: [name], MD;  Location: [facility];  Service: Urology;  Laterality: Right;  . PARS PLANA VITRECTOMY  04/04/2012   Procedure: PARS PLANA VITRECTOMY WITH 25 Nathan;  Surgeon: [name], MD;  Location: [facility];  Service: Ophthalmology;  Laterality: Left;  with laser  . POLYPECTOMY  05/12/2017   Procedure: POLYPECTOMY;  Surgeon: Lucilla Lame, MD;  Location: [facility];  Service: Endoscopy;;  . SHOULDER SURGERY     left shoulder  . VASECTOMY Left 04/08/2016   Procedure: VASECTOMY;  Surgeon: [name], MD;  Location: [facility];  Service: Urology;  Laterality: Left;    Family History  Problem Relation Age of Onset  . Diabetes Mother   . Heart disease Mother   . Hypertension Mother   . Heart disease Father   . Ovarian cancer Maternal Grandmother   . Kidney disease Neg Hx   . Prostate cancer Neg Hx     Social History:  reports that he has never smoked. He has never used smokeless tobacco. He reports that he drinks about 14.4 oz of alcohol per week . He reports that he does not use  drugs.  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  He worked 40 hours this past week.  He notes that his "insurance resets in 08/2016".  The patient is alone today.  Allergies: No Known Allergies  Current Medications: Current Outpatient Prescriptions  Medication Sig Dispense Refill  . amLODipine (NORVASC) 10 MG tablet Take 10 mg by mouth daily.  5  . aspirin EC 81 MG tablet Take 81 mg by mouth daily.    Marland Kitchen atorvastatin (LIPITOR) 20 MG tablet Take 20 mg by mouth daily.    Marland Kitchen glimepiride (AMARYL) 2 MG  tablet TAKE 1 TABLET BY MOUTH DAILY WITH FIRST MEAL  11  . Insulin Glargine (TOUJEO SOLOSTAR) 300 UNIT/ML SOPN Inject 80 Units into the skin every evening.     Marland Kitchen losartan (COZAAR) 100 MG tablet Take 100 mg by mouth daily.    . metFORMIN (GLUCOPHAGE) 1000 MG tablet Take 1,000 mg by mouth 2 (two) times daily with a meal.    . ALPRAZolam (XANAX) 0.5 MG tablet Take 0.5 mg by mouth every 6 (six) hours as needed. for anxiety  2  . glucose blood (CONTOUR NEXT TEST) test strip TEST TWO TIMES DAILY    . TRULICITY 7.74 JO/8.7OM SOPN INJECT 1 (ONE) MILLILITER UNDER SKIN SUBCUTANEOUSLY ONCE WEEKLY  12   No current facility-administered medications for this visit.    Facility-Administered Medications Ordered in Other Visits  Medication Dose Route Frequency Provider Last Rate Last Dose  . 0.9 %  sodium chloride infusion   Intravenous Once Corcoran, Melissa C, MD      . 0.9 %  sodium chloride infusion   Intravenous Once Corcoran, Melissa C, MD      . bleomycin (BLEOCIN) 30 Units in sodium chloride 0.9 % 50 mL chemo infusion  30 Units Intravenous Once Corcoran, Melissa C, MD      . magnesium sulfate IVPB 2 g 50 mL  2 g Intravenous Once Corcoran, Melissa C, MD      . ondansetron (ZOFRAN) 8 mg in sodium chloride 0.9 % 50 mL IVPB   Intravenous Once Corcoran, Melissa C, MD      . sodium chloride 0.9 % injection 10 mL  10 mL Intracatheter PRN Lequita Asal, MD        Review of Systems:  GENERAL:  Feels "ok".  No fever or sweats.  Weigh down 19 pounds. PERFORMANCE STATUS (ECOG):  1 HEENT:  No visual changes, runny nose, sore throat, mouth sores or tenderness. Lungs:  No shortness of breath.  No cough.  No hemoptysis. Cardiac:  No chest pain, palpitations, orthopnea, or PND. GI:   No nausea, vomiting, constipation, melena or hematochezia.  No colonoscopy. GU:  No urgency, frequency, dysuria, or hematuria. Musculoskeletal:  No back pain.  No joint pain.  No muscle tenderness. Extremities:  No pain or  swelling. Skin:  No rashes or skin changes. Neuro:  No headache, numbness or weakness, balance or coordination issues. Endocrine:  Diabetes.  No thyroid issues, hot flashes or night sweats. Psych:  Stress.  No mood changes, depression or anxiety. Pain:  No focal pain. Review of systems:  All other systems reviewed and found to be negative.  Physical Exam: Blood pressure (!) 161/71, pulse 87, temperature 97.6 F (36.4 C), temperature source Tympanic, weight  233 lb 1.6 oz (105.7 kg).  GENERAL:  Well developed, well nourished, gentleman sitting comfortably in the exam room in no acute distress. MENTAL STATUS:  Alert and oriented to person, place and time. HEAD:  Alopecia.  White beard. Normocephalic, atraumatic, face symmetric, no Cushingoid features. EYES:  Glasses.  Brown eyes.  No conjunctivitis or scleral icterus. RESPIRATORY:  Clear to auscultation without rales, wheezes or rhonchi. CARDIOVASCULAR:  Regular rate and rhythm without murmur, rub or gallop. ABDOMEN:  Soft, non-tender, with active bowel sounds, and no appreciable hepatosplenomegaly.  No masses. GENITOURINARY:  Normal penis.  s/p right orchiectomy with right inguinal scarring. SKIN:  No rashes, ulcers or lesions. LYMPH NODES:  No palpable cervical, supraclavicular, axillary or inguinal adenopathy. EXTREMITIES: No edema, skin discoloration or tenderness.  No palpable cords. NEUROLOGICAL: Unremarkable. PSYCH:  Appropriate.   No visits with results within 3 Day(s) from this visit.  Latest known visit with results is:  Office Visit on 03/15/2017  Component Date Value Ref Range Status  . Volume 03/15/2017 1.1  Not Estab. mL Final  . Post-Vas Sperm, Qual 03/15/2017 Comment  Absent Final   Comment: No sperm were seen in three aliquots of the uncentrifuged specimen, if sperm are present they are below the limit of detection.     Assessment:  Nathan Calderon is a 56 y.o. male with stage IB right testicular cancer s/p  orchiectomy on 04/08/2016.  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  There was lymphovascular invasion. Pathologic stage was T2Nx (stage IB).    Abdominal and pelvic CT scan on 04/15/2016 and chest CT on 05/03/2016 revealed no evidence of metastatic disease.   Labs on 04/02/2016 revealed a normal AFP (2.2), beta-hCG (<1) and LDH (203).  Labs on 06/07/2016 revealed a normal AFP (4.9) and beta-hCG (<1).  PFTs on 05/13/2016 revealed FVC 3.82 liters (75%), FEV1 3.06 liters (84%), TLC 5.5  liters (77%), VC 4.00 liters (79%), and DLCO 28.6 ml/mmHg/min (93%).  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  He received 2 cycles of cycles of BEP (05/17/2016 - 06/07/2016).    Labs on 06/07/2016 revealed an elevated LDH of 450 (98-192).  Follow-up testing on 06/08/2016 revealed a normal CPK (50) and a slightly elevated troponin (0.10).  He was seen by cardiology on 06/08/2016.  LDH was 234 on 06/10/2016.  CXR on 06/28/2016 revealed no active cardiopulmonary disease.  Chest CT without contrast on 09/27/2016 revealed interval enlargement of multiple small mediastinal, hilar and upper abdominal lymph nodes.  Lymph nodes included paratracheal (7 mm to 12 mm), AP window node (6 mm to 10 mm), subcarinal (7 mm to 11 mm), hilar (more prominent), gastrohepatic ligament node (10 mm to 12 mm and 9 mm to 10 mm).  There were no suspicious pulmonary findings.  There was hepatic steatosis.  Abdomen and pelvic CT on 09/28/2016 revealed minimal enlargement of borderline gastrohepatic ligament node, not in the typical drainage pattern for testicular cancer. Likely reactive and possibly related to hepatic steatosis.  He was s/p right orchiectomy without typical findings of metastatic disease.  There was left nephrolithiasis.  Chest CT on 04/05/2017 revealed interval development of peribronchovascular  nodularity right middle and lower lobes. Imaging features suggested atypical infection.  There was an interval decrease in the mediastinal lymph nodes, now measuring within normal limits for size.  There were borderline upper abdominal lymph nodes stable to slightly decreased  in the interval.   LDH has been followed: 203 on 04/02/2016, 169 on 05/17/2016, 4:15 on 06/07/2016, 393 on 06/08/2016, to 34 on 06/10/2016, for 70 on 06/21/2016, 140 on 07/23/2016, and 157 on 09/28/2016.    AFP has been followed:  2.2 on 04/02/2016, 2.1 on 05/17/2016, 4.9 on 06/07/2016, 1.7 on 07/23/2016, and 2.1 on 09/28/2016.  Beta-HCG has been followed: <1 on 05/17/2016, <1 on 06/07/2016, <1 on 07/23/2016, and <1 on 09/28/2016  He has a normocytic anemia likely secondary to chemotherapy.  Labs on 05/19/2016 revealed a normal ferritin (186), B12 (350), folate (20.2), and TSH (2.737).  Reticulocyte count was 2.4%.  Hematocrit has improved from 28.0 to 29.3 to 31.2.  Symptomatically, he is doing well.  Exam is normal.   Plan: 1.  Labs today:  CBC with diff, CMP, Mg, LDH, beta HCG, AFP. [Patient states labs being drawn by PCP tomorrow] 2.  Review interval chest CT. 3.  GI consult re: colonoscopy. 4.  Schedule abdomen and pelvic CT scan with contrast on 09/28/2017. 5.  Continue surveillance:  clinic evaluation with tumor markers every 3 months (year 1-2), every 6 months (year 3-4), and annual (year 5).  Annuual abdominal/pelvic CT scan (year 1-2).  CXR or chest CT every 6 months (year 1) and annual (year 2). 6.  RTC after CT scan for MD assessment and labs (CBC with diff, CMP, LDH, AFP, betaHCG).  Addendum:  [facility] labs on 04/13/2017 revealed the following normal labs:  CBC with diff (hematocrit 39.3), CMP (sodium 134, Cr 0.89), and LDH (202).  AFP was 1.4 (0-8.3).  Beta-HCG was  < 1.   Lequita Asal, MD  04/12/2017

## 2017-05-02 ENCOUNTER — Other Ambulatory Visit: Payer: Self-pay

## 2017-05-02 ENCOUNTER — Ambulatory Visit: Payer: BLUE CROSS/BLUE SHIELD | Admitting: Gastroenterology

## 2017-05-02 DIAGNOSIS — Z1212 Encounter for screening for malignant neoplasm of rectum: Secondary | ICD-10-CM

## 2017-05-02 DIAGNOSIS — Z1211 Encounter for screening for malignant neoplasm of colon: Secondary | ICD-10-CM

## 2017-05-05 ENCOUNTER — Encounter: Payer: Self-pay | Admitting: *Deleted

## 2017-05-11 NOTE — Discharge Instructions (Signed)
General Anesthesia, Adult, Care After °These instructions provide you with information about caring for yourself after your procedure. Your health care provider may also give you more specific instructions. Your treatment has been planned according to current medical practices, but problems sometimes occur. Call your health care provider if you have any problems or questions after your procedure. °What can I expect after the procedure? °After the procedure, it is common to have: °· Vomiting. °· A sore throat. °· Mental slowness. ° °It is common to feel: °· Nauseous. °· Cold or shivery. °· Sleepy. °· Tired. °· Sore or achy, even in parts of your body where you did not have surgery. ° °Follow these instructions at home: °For at least 24 hours after the procedure: °· Do not: °? Participate in activities where you could fall or become injured. °? Drive. °? Use heavy machinery. °? Drink alcohol. °? Take sleeping pills or medicines that cause drowsiness. °? Make important decisions or sign legal documents. °? Take care of children on your own. °· Rest. °Eating and drinking °· If you vomit, drink water, juice, or soup when you can drink without vomiting. °· Drink enough fluid to keep your urine clear or pale yellow. °· Make sure you have little or no nausea before eating solid foods. °· Follow the diet recommended by your health care provider. °General instructions °· Have a responsible adult stay with you until you are awake and alert. °· Return to your normal activities as told by your health care provider. Ask your health care provider what activities are safe for you. °· Take over-the-counter and prescription medicines only as told by your health care provider. °· If you smoke, do not smoke without supervision. °· Keep all follow-up visits as told by your health care provider. This is important. °Contact a health care provider if: °· You continue to have nausea or vomiting at home, and medicines are not helpful. °· You  cannot drink fluids or start eating again. °· You cannot urinate after 8-12 hours. °· You develop a skin rash. °· You have fever. °· You have increasing redness at the site of your procedure. °Get help right away if: °· You have difficulty breathing. °· You have chest pain. °· You have unexpected bleeding. °· You feel that you are having a life-threatening or urgent problem. °This information is not intended to replace advice given to you by your health care provider. Make sure you discuss any questions you have with your health care provider. °Document Released: 02/21/2001 Document Revised: 04/19/2016 Document Reviewed: 10/30/2015 °Elsevier Interactive Patient Education © 2018 [employer] ° °

## 2017-05-12 ENCOUNTER — Ambulatory Visit: Payer: BLUE CROSS/BLUE SHIELD | Admitting: Anesthesiology

## 2017-05-12 ENCOUNTER — Ambulatory Visit
Admission: RE | Admit: 2017-05-12 | Discharge: 2017-05-12 | Disposition: A | Discharge status: Home / Self Care | Payer: BLUE CROSS/BLUE SHIELD | Source: Ambulatory Visit | Attending: Gastroenterology | Admitting: Gastroenterology

## 2017-05-12 ENCOUNTER — Encounter
Admission: RE | Disposition: A | Discharge status: Home / Self Care | Payer: Self-pay | Source: Ambulatory Visit | Attending: Gastroenterology

## 2017-05-12 DIAGNOSIS — F419 Anxiety disorder, unspecified: Secondary | ICD-10-CM | POA: Diagnosis not present

## 2017-05-12 DIAGNOSIS — I10 Essential (primary) hypertension: Secondary | ICD-10-CM | POA: Insufficient documentation

## 2017-05-12 DIAGNOSIS — Z8249 Family history of ischemic heart disease and other diseases of the circulatory system: Secondary | ICD-10-CM | POA: Insufficient documentation

## 2017-05-12 DIAGNOSIS — Z9079 Acquired absence of other genital organ(s): Secondary | ICD-10-CM | POA: Insufficient documentation

## 2017-05-12 DIAGNOSIS — D122 Benign neoplasm of ascending colon: Secondary | ICD-10-CM

## 2017-05-12 DIAGNOSIS — Z9852 Vasectomy status: Secondary | ICD-10-CM | POA: Diagnosis not present

## 2017-05-12 DIAGNOSIS — Z8041 Family history of malignant neoplasm of ovary: Secondary | ICD-10-CM | POA: Insufficient documentation

## 2017-05-12 DIAGNOSIS — Z7982 Long term (current) use of aspirin: Secondary | ICD-10-CM | POA: Diagnosis not present

## 2017-05-12 DIAGNOSIS — Z794 Long term (current) use of insulin: Secondary | ICD-10-CM | POA: Diagnosis not present

## 2017-05-12 DIAGNOSIS — Z87442 Personal history of urinary calculi: Secondary | ICD-10-CM | POA: Insufficient documentation

## 2017-05-12 DIAGNOSIS — K640 First degree hemorrhoids: Secondary | ICD-10-CM | POA: Insufficient documentation

## 2017-05-12 DIAGNOSIS — Z1211 Encounter for screening for malignant neoplasm of colon: Secondary | ICD-10-CM | POA: Diagnosis not present

## 2017-05-12 DIAGNOSIS — Z833 Family history of diabetes mellitus: Secondary | ICD-10-CM | POA: Insufficient documentation

## 2017-05-12 DIAGNOSIS — Z8547 Personal history of malignant neoplasm of testis: Secondary | ICD-10-CM | POA: Diagnosis not present

## 2017-05-12 DIAGNOSIS — Z1212 Encounter for screening for malignant neoplasm of rectum: Secondary | ICD-10-CM

## 2017-05-12 DIAGNOSIS — G4730 Sleep apnea, unspecified: Secondary | ICD-10-CM | POA: Diagnosis not present

## 2017-05-12 DIAGNOSIS — Z7984 Long term (current) use of oral hypoglycemic drugs: Secondary | ICD-10-CM | POA: Diagnosis not present

## 2017-05-12 DIAGNOSIS — E119 Type 2 diabetes mellitus without complications: Secondary | ICD-10-CM | POA: Diagnosis not present

## 2017-05-12 DIAGNOSIS — E785 Hyperlipidemia, unspecified: Secondary | ICD-10-CM | POA: Diagnosis not present

## 2017-05-12 DIAGNOSIS — Z9889 Other specified postprocedural states: Secondary | ICD-10-CM | POA: Diagnosis not present

## 2017-05-12 DIAGNOSIS — Z79899 Other long term (current) drug therapy: Secondary | ICD-10-CM | POA: Insufficient documentation

## 2017-05-12 HISTORY — DX: Cardiac murmur, unspecified: R01.1

## 2017-05-12 HISTORY — PX: POLYPECTOMY: SHX5525

## 2017-05-12 HISTORY — DX: Motion sickness, initial encounter: T75.3XXA

## 2017-05-12 HISTORY — PX: COLONOSCOPY WITH PROPOFOL: SHX5780

## 2017-05-12 LAB — GLUCOSE, CAPILLARY
GLUCOSE-CAPILLARY: 185 mg/dL — AB (ref 65–99)
Glucose-Capillary: 182 mg/dL — ABNORMAL HIGH (ref 65–99)

## 2017-05-12 SURGERY — COLONOSCOPY WITH PROPOFOL
Anesthesia: General

## 2017-05-12 MED ORDER — LACTATED RINGERS IV SOLN
INTRAVENOUS | Status: DC
  Administered 2017-05-12: 08:00:00 via INTRAVENOUS

## 2017-05-12 MED ORDER — LIDOCAINE HCL (CARDIAC) 20 MG/ML IV SOLN
INTRAVENOUS | Status: DC | PRN
  Administered 2017-05-12: 40 mg via INTRAVENOUS

## 2017-05-12 MED ORDER — STERILE WATER FOR IRRIGATION IR SOLN
Status: DC | PRN
  Administered 2017-05-12: 08:00:00

## 2017-05-12 MED ORDER — PROPOFOL 10 MG/ML IV BOLUS
INTRAVENOUS | Status: DC | PRN
  Administered 2017-05-12 (×5): 20 mg via INTRAVENOUS
  Administered 2017-05-12: 80 mg via INTRAVENOUS
  Administered 2017-05-12: 20 mg via INTRAVENOUS

## 2017-05-12 MED ORDER — SODIUM CHLORIDE 0.9 % IV SOLN: INTRAVENOUS | Status: DC

## 2017-05-12 SURGICAL SUPPLY — 23 items
CANISTER SUCT 1200ML W/VALVE (MISCELLANEOUS) ×4 IMPLANT
CLIP HMST 235XBRD CATH ROT (MISCELLANEOUS) IMPLANT
CLIP RESOLUTION 360 11X235 (MISCELLANEOUS)
FCP ESCP3.2XJMB 240X2.8X (MISCELLANEOUS)
FORCEPS BIOP RAD 4 LRG CAP 4 (CUTTING FORCEPS) IMPLANT
FORCEPS BIOP RJ4 240 W/NDL (MISCELLANEOUS)
FORCEPS ESCP3.2XJMB 240X2.8X (MISCELLANEOUS) IMPLANT
GOWN CVR UNV OPN BCK APRN NK (MISCELLANEOUS) ×4 IMPLANT
GOWN ISOL THUMB LOOP REG UNIV (MISCELLANEOUS) ×8
INJECTOR VARIJECT VIN23 (MISCELLANEOUS) IMPLANT
KIT DEFENDO VALVE AND CONN (KITS) IMPLANT
KIT ENDO PROCEDURE OLY (KITS) ×4 IMPLANT
MARKER SPOT ENDO TATTOO 5ML (MISCELLANEOUS) IMPLANT
PAD GROUND ADULT SPLIT (MISCELLANEOUS) IMPLANT
PROBE APC STR FIRE (PROBE) IMPLANT
RETRIEVER NET ROTH 2.5X230 LF (MISCELLANEOUS) IMPLANT
SNARE SHORT THROW 13M SML OVAL (MISCELLANEOUS) ×2 IMPLANT
SNARE SHORT THROW 30M LRG OVAL (MISCELLANEOUS) IMPLANT
SNARE SNG USE RND 15MM (INSTRUMENTS) IMPLANT
SPOT EX ENDOSCOPIC TATTOO (MISCELLANEOUS)
TRAP ETRAP POLY (MISCELLANEOUS) ×2 IMPLANT
VARIJECT INJECTOR VIN23 (MISCELLANEOUS)
WATER STERILE IRR 250ML POUR (IV SOLUTION) ×4 IMPLANT

## 2017-05-12 NOTE — Transfer of Care (Signed)
Immediate Anesthesia Transfer of Care Note  Patient: Nathan Calderon  Procedure(s) Performed: Procedure(s) with comments: COLONOSCOPY WITH PROPOFOL (N/A) - Cannot arrive before 8:45 Diabetic - insulin and oral meds sleep apnea POLYPECTOMY  Patient Location: PACU  Anesthesia Type: General  Level of Consciousness: awake, alert  and patient cooperative  Airway and Oxygen Therapy: Patient Spontanous Breathing and Patient connected to supplemental oxygen  Post-op Assessment: Post-op Vital signs reviewed, Patient's Cardiovascular Status Stable, Respiratory Function Stable, Patent Airway and No signs of Nausea or vomiting  Post-op Vital Signs: Reviewed and stable  Complications: No apparent anesthesia complications

## 2017-05-12 NOTE — Anesthesia Preprocedure Evaluation (Signed)
Anesthesia Evaluation  Patient identified by MRN, date of birth, ID band  Reviewed: NPO status   History of Anesthesia Complications Negative for: history of anesthetic complications  Airway Mallampati: II  TM Distance: >3 FB Neck ROM: full    Dental no notable dental hx.    Pulmonary sleep apnea (no cpap) ,    Pulmonary exam normal        Cardiovascular Exercise Tolerance: Good hypertension, (-) anginaNormal cardiovascular exam+ Valvular Problems/Murmurs (benign murmur as child)      Neuro/Psych  Headaches, Anxiety negative psych ROS   GI/Hepatic negative GI ROS, Neg liver ROS,   Endo/Other  diabetes  Renal/GU Renal disease (renal stone)  negative genitourinary   Musculoskeletal   Abdominal   Peds  Hematology Testicular cancer   Anesthesia Other Findings TIVA  Reproductive/Obstetrics                             Anesthesia Physical Anesthesia Plan  ASA: II  Anesthesia Plan: General   Post-op Pain Management:    Induction:   PONV Risk Score and Plan:   Airway Management Planned:   Additional Equipment:   Intra-op Plan:   Post-operative Plan:   Informed Consent: I have reviewed the patients History and Physical, chart, labs and discussed the procedure including the risks, benefits and alternatives for the proposed anesthesia with the patient or authorized representative who has indicated his/her understanding and acceptance.     Plan Discussed with: CRNA  Anesthesia Plan Comments:         Anesthesia Quick Evaluation

## 2017-05-12 NOTE — Anesthesia Procedure Notes (Signed)
Performed by: Carnetta Losada Pre-anesthesia Checklist: Patient identified, Emergency Drugs available, Suction available, Timeout performed and Patient being monitored Patient Re-evaluated:Patient Re-evaluated prior to induction Oxygen Delivery Method: Nasal cannula Placement Confirmation: positive ETCO2       

## 2017-05-12 NOTE — Op Note (Signed)
[facility] Gastroenterology Patient Name: Nathan Calderon Procedure Date: 05/12/2017 8:12 AM MRN: [id number] Account #: [id number] Date of Birth: 1961-02-04 Admit Type: Outpatient Age: 56 Room: [facility] OR ROOM 01 Gender: Male Note Status: Finalized Procedure:            Colonoscopy Indications:          Screening for colorectal malignant neoplasm Providers:            Lucilla Lame MD, MD Referring MD:         Philmore Pali (Referring MD) Medicines:            Propofol per Anesthesia Complications:        No immediate complications. Procedure:            Pre-Anesthesia Assessment:                       - Prior to the procedure, a History and Physical was                        performed, and patient medications and allergies were                        reviewed. The patient's tolerance of previous                        anesthesia was also reviewed. The risks and benefits of                        the procedure and the sedation options and risks were                        discussed with the patient. All questions were                        answered, and informed consent was obtained. Prior                        Anticoagulants: The patient has taken no previous                        anticoagulant or antiplatelet agents. ASA Grade                        Assessment: II - A patient with mild systemic disease.                        After reviewing the risks and benefits, the patient was                        deemed in satisfactory condition to undergo the                        procedure.                       After obtaining informed consent, the colonoscope was                        passed under direct vision. Throughout the procedure,  the patient's blood pressure, pulse, and oxygen                        saturations were monitored continuously. The [place] [id number]) was introduced through the             anus and advanced to the the cecum, identified by                        appendiceal orifice and ileocecal valve. The                        colonoscopy was performed without difficulty. The                        patient tolerated the procedure well. The quality of                        the bowel preparation was excellent. Findings:      The perianal and digital rectal examinations were normal.      Two sessile polyps were found in the ascending colon. The polyps were 3       to 9 mm in size. These polyps were removed with a cold snare. Resection       and retrieval were complete.      Non-bleeding internal hemorrhoids were found during retroflexion. The       hemorrhoids were Grade I (internal hemorrhoids that do not prolapse). Impression:           - Two 3 to 9 mm polyps in the ascending colon, removed                        with a cold snare. Resected and retrieved.                       - Non-bleeding internal hemorrhoids. Recommendation:       - Discharge patient to home.                       - Resume previous diet.                       - Continue present medications.                       - Await pathology results.                       - Repeat colonoscopy in 5 years if polyp adenoma and 10                        years if hyperplastic Procedure Code(s):    --- Professional ---                       [phone], Colonoscopy, flexible; with removal of tumor(s),                        polyp(s), or other lesion(s) by snare technique Diagnosis Code(s):    --- Professional ---  Z12.11, Encounter for screening for malignant neoplasm                        of colon                       D12.2, Benign neoplasm of ascending colon CPT copyright 2016 American Medical Association. All rights reserved. The codes documented in this report are preliminary and upon coder review may  be revised to meet current compliance requirements. Lucilla Lame MD, MD 05/12/2017  8:35:34 AM This report has been signed electronically. Number of Addenda: 0 Note Initiated On: 05/12/2017 8:12 AM Scope Withdrawal Time: 0 hours 7 minutes 25 seconds  Total Procedure Duration: 0 hours 11 minutes 51 seconds       [facility]

## 2017-05-12 NOTE — H&P (Signed)
Lucilla Lame, MD [facility] [address], [place], [place] [address] Phone: [phone] Fax : [phone]  Primary Care Physician:  Philmore Pali, NP Primary Gastroenterologist:  Dr. Allen Norris  Pre-Procedure History & Physical: HPI:  Nathan Calderon is a 56 y.o. male is here for a screening colonoscopy.   Past Medical History:  Diagnosis Date  . Angina    2006, had cardiac cath at the time  . Anxiety   . Diabetes mellitus    Type 2  . Headache(784.0)   . Heart murmur    as child  . Hyperlipidemia   . Hypertension   . Kidney stone    hx of  . Motion sickness    ocean boats  . Sleep apnea    no CPAP.  Never got it.  . Testicular cancer ([place]) 04/08/2016    Past Surgical History:  Procedure Laterality Date  . CARDIAC CATHETERIZATION  2000   and 2006  . EYE SURGERY     x 2  . HERNIA REPAIR     with undecended testicle  . LASIK    . ORCHIECTOMY Right 04/08/2016   Procedure: ORCHIECTOMY/ INGUINAL APPROACH;  Surgeon: [name], MD;  Location: [facility];  Service: Urology;  Laterality: Right;  . PARS PLANA VITRECTOMY  04/04/2012   Procedure: PARS PLANA VITRECTOMY WITH 25 GAUGE;  Surgeon: [name], MD;  Location: [place];  Service: Ophthalmology;  Laterality: Left;  with laser  . SHOULDER SURGERY     left shoulder  . VASECTOMY Left 04/08/2016   Procedure: VASECTOMY;  Surgeon: [name], MD;  Location: [facility];  Service: Urology;  Laterality: Left;    Prior to Admission medications   Medication Sig Start Date End Date Taking? Authorizing Provider  ALPRAZolam Duanne Moron) 0.5 MG tablet Take 0.5 mg by mouth every 6 (six) hours as needed. for anxiety 04/18/17  Yes [provider]  amLODipine (NORVASC) 10 MG tablet Take 10 mg by mouth daily. 03/05/16  Yes [provider]  aspirin EC 81 MG tablet Take 81 mg by mouth daily.   Yes [provider]  atorvastatin (LIPITOR) 20 MG tablet Take 20 mg by mouth daily.   Yes [provider]  glucose  blood (CONTOUR NEXT TEST) test strip TEST TWO TIMES DAILY 01/03/16  Yes [provider]  Insulin Glargine (TOUJEO SOLOSTAR) 300 UNIT/ML SOPN Inject 80 Units into the skin every evening.    Yes [provider]  losartan (COZAAR) 100 MG tablet Take 100 mg by mouth daily.   Yes [provider]  metFORMIN (GLUCOPHAGE) 1000 MG tablet Take 1,000 mg by mouth 2 (two) times daily with a meal.   Yes [provider]  TRULICITY 9.67 RF/1.6BW SOPN INJECT 1 (ONE) MILLILITER UNDER SKIN SUBCUTANEOUSLY ONCE WEEKLY 04/13/17  Yes [provider]  glimepiride (AMARYL) 2 MG tablet TAKE 1 TABLET BY MOUTH DAILY WITH FIRST MEAL 03/05/16   [provider]    Allergies as of 05/02/2017  . (No Known Allergies)    Family History  Problem Relation Age of Onset  . Diabetes Mother   . Heart disease Mother   . Hypertension Mother   . Heart disease Father   . Ovarian cancer Maternal Grandmother   . Kidney disease Neg Hx   . Prostate cancer Neg Hx     Social History   Social History  . Marital status: Divorced    Spouse name: N/A  . Number of children: N/A  .  Years of education: N/A   Occupational History  . Not on file.   Social History Main Topics  . Smoking status: Never Smoker  . Smokeless tobacco: Never Used  . Alcohol use 14.4 oz/week    24 Cans of beer per week     Comment: 3-4 beers a day ( "not every day") 2- 3 times a week  . Drug use: No  . Sexual activity: Yes   Other Topics Concern  . Not on file   Social History Narrative  . No narrative on file    Review of Systems: See HPI, otherwise negative ROS  Physical Exam: BP (!) 143/72   Pulse 74   Temp 97.7 F (36.5 C) (Temporal)   Resp 16   Ht 6\' 1"  (1.854 m)   Wt 227 lb (103 kg)   SpO2 100%   BMI 29.95 kg/m  General:   Alert,  pleasant and cooperative in NAD Head:  Normocephalic and atraumatic. Neck:  Supple; no masses or thyromegaly. Lungs:  Clear throughout to auscultation.     Heart:  Regular rate and rhythm. Abdomen:  Soft, nontender and nondistended. Normal bowel sounds, without guarding, and without rebound.   Neurologic:  Alert and  oriented x4;  grossly normal neurologically.  Impression/Plan: Nathan Calderon is now here to undergo a screening colonoscopy.  Risks, benefits, and alternatives regarding colonoscopy have been reviewed with the patient.  Questions have been answered.  All parties agreeable.

## 2017-05-12 NOTE — Anesthesia Postprocedure Evaluation (Signed)
Anesthesia Post Note  Patient: Nathan Calderon  Procedure(s) Performed: Procedure(s) (LRB): COLONOSCOPY WITH PROPOFOL (N/A) POLYPECTOMY  Patient location during evaluation: PACU Anesthesia Type: General Level of consciousness: awake and alert Pain management: pain level controlled Vital Signs Assessment: post-procedure vital signs reviewed and stable Respiratory status: spontaneous breathing, nonlabored ventilation, respiratory function stable and patient connected to nasal cannula oxygen Cardiovascular status: blood pressure returned to baseline and stable Postop Assessment: no signs of nausea or vomiting Anesthetic complications: no    Suzane Vanderweide

## 2017-05-13 ENCOUNTER — Encounter: Payer: Self-pay | Admitting: Gastroenterology

## 2017-05-16 ENCOUNTER — Encounter: Payer: Self-pay | Admitting: Gastroenterology

## 2017-05-17 ENCOUNTER — Ambulatory Visit: Payer: BLUE CROSS/BLUE SHIELD | Admitting: Urology

## 2017-06-10 ENCOUNTER — Encounter: Payer: Self-pay | Admitting: Nurse Practitioner

## 2017-07-13 ENCOUNTER — Encounter: Payer: Self-pay | Admitting: Emergency Medicine

## 2017-07-13 ENCOUNTER — Emergency Department: Payer: BLUE CROSS/BLUE SHIELD

## 2017-07-13 ENCOUNTER — Emergency Department
Admission: EM | Admit: 2017-07-13 | Discharge: 2017-07-13 | Disposition: A | Discharge status: Home / Self Care | Payer: BLUE CROSS/BLUE SHIELD | Attending: Emergency Medicine | Admitting: Emergency Medicine

## 2017-07-13 DIAGNOSIS — I10 Essential (primary) hypertension: Secondary | ICD-10-CM | POA: Diagnosis not present

## 2017-07-13 DIAGNOSIS — Z7984 Long term (current) use of oral hypoglycemic drugs: Secondary | ICD-10-CM | POA: Insufficient documentation

## 2017-07-13 DIAGNOSIS — Z79899 Other long term (current) drug therapy: Secondary | ICD-10-CM | POA: Diagnosis not present

## 2017-07-13 DIAGNOSIS — E119 Type 2 diabetes mellitus without complications: Secondary | ICD-10-CM | POA: Diagnosis not present

## 2017-07-13 DIAGNOSIS — R0789 Other chest pain: Secondary | ICD-10-CM | POA: Diagnosis not present

## 2017-07-13 DIAGNOSIS — R079 Chest pain, unspecified: Secondary | ICD-10-CM | POA: Diagnosis present

## 2017-07-13 LAB — CBC
HCT: 43 % (ref 40.0–52.0)
HEMOGLOBIN: 15.1 g/dL (ref 13.0–18.0)
MCH: 33 pg (ref 26.0–34.0)
MCHC: 35.1 g/dL (ref 32.0–36.0)
MCV: 94.2 fL (ref 80.0–100.0)
Platelets: 286 10*3/uL (ref 150–440)
RBC: 4.57 MIL/uL (ref 4.40–5.90)
RDW: 12.9 % (ref 11.5–14.5)
WBC: 8.2 10*3/uL (ref 3.8–10.6)

## 2017-07-13 LAB — BASIC METABOLIC PANEL
Anion gap: 11 (ref 5–15)
BUN: 18 mg/dL (ref 6–20)
CHLORIDE: 96 mmol/L — AB (ref 101–111)
CO2: 27 mmol/L (ref 22–32)
Calcium: 9.6 mg/dL (ref 8.9–10.3)
Creatinine, Ser: 0.93 mg/dL (ref 0.61–1.24)
GFR calc Af Amer: >60 mL/min (ref >60)
GFR calc non Af Amer: >60 mL/min (ref >60)
Glucose, Bld: 192 mg/dL — ABNORMAL HIGH (ref 65–99)
POTASSIUM: 4.1 mmol/L (ref 3.5–5.1)
SODIUM: 134 mmol/L — AB (ref 135–145)

## 2017-07-13 LAB — TROPONIN I: Troponin I: <0.03 ng/mL (ref <0.03)

## 2017-07-13 NOTE — Discharge Instructions (Signed)
Follow up with your primary care and with your cardiologist within the next several weeks.  You may take over the counter medications for the pain.  Return to the ER for new or worsening pain, exertional pain, weakness or any other symptoms that concern you.

## 2017-07-13 NOTE — ED Notes (Signed)
Iv dc'ed   D/c inst to pt.  Pt alert.   Family with pt

## 2017-07-13 NOTE — ED Provider Notes (Signed)
[facility] Emergency Department Provider Note ____________________________________________   First MD Initiated Contact with Patient 07/13/17 1108     (approximate)  I have reviewed the triage vital signs and the nursing notes.   HISTORY  Chief Complaint Chest Pain    HPI Nathan Calderon is a 56 y.o. male who presents with chest pain for 2 days, constant, but waxing and waning in  Mainly on the left side and substernal and slightly radiating to the back. Patient also reports a funny feeling in his left arm intermittently.Patient denies associated shortness of breath but reports mild lightheadedness.  Pain is nonexertional. No leg pain or unilateral swelling. No prior history of this pain.patient previously received chemo for testicular ca, and had some cardiology follow-up in the past with prior cath in 2006 which had no concerning findings per pt. Patient states he has not had a recent stress test or other cardiology workup.    Past Medical History:  Diagnosis Date  . Angina    2006, had cardiac cath at the time  . Anxiety   . Diabetes mellitus    Type 2  . Headache(784.0)   . Heart murmur    as child  . Hyperlipidemia   . Hypertension   . Kidney stone    hx of  . Motion sickness    ocean boats  . Sleep apnea    no CPAP.  Never got it.  . Testicular cancer ([place]) 04/08/2016    Patient Active Problem List   Diagnosis Date Noted  . Encounter for colorectal cancer screening   . Benign neoplasm of ascending colon   . Shortness of breath 06/28/2016  . Nausea without vomiting 06/15/2016  . Thrombocytopenia ([place]) 06/02/2016  . Hyponatremia 06/02/2016  . Drug-induced neutropenia ([place]) 05/26/2016  . Anemia 05/17/2016  . Hypomagnesemia 05/17/2016  . 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 Testicular cancer ([place]) 04/08/2016  . 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 Vitreous hemorrhage ([place]) 04/04/2012  . Proliferative  diabetic retinopathy(362.02) 04/04/2012    Past Surgical History:  Procedure Laterality Date  . CARDIAC CATHETERIZATION  2000   and 2006  . COLONOSCOPY WITH PROPOFOL N/A 05/12/2017   Procedure: COLONOSCOPY WITH PROPOFOL;  Surgeon: Lucilla Lame, MD;  Location: [facility];  Service: Endoscopy;  Laterality: N/A;  Cannot arrive before 8:45 Diabetic - insulin and oral meds sleep apnea  . EYE SURGERY     x 2  . HERNIA REPAIR     with undecended testicle  . LASIK    . ORCHIECTOMY Right 04/08/2016   Procedure: ORCHIECTOMY/ INGUINAL APPROACH;  Surgeon: [name], MD;  Location: [facility];  Service: Urology;  Laterality: Right;  . PARS PLANA VITRECTOMY  04/04/2012   Procedure: PARS PLANA VITRECTOMY WITH 25 GAUGE;  Surgeon: [name], MD;  Location: [facility];  Service: Ophthalmology;  Laterality: Left;  with laser  . POLYPECTOMY  05/12/2017   Procedure: POLYPECTOMY;  Surgeon: Lucilla Lame, MD;  Location: [facility];  Service: Endoscopy;;  . SHOULDER SURGERY     left shoulder  . VASECTOMY Left 04/08/2016   Procedure: VASECTOMY;  Surgeon: [name], MD;  Location: [facility];  Service: Urology;  Laterality: Left;    Prior to Admission medications   Medication Sig Start Date End Date Taking? Authorizing Provider  ALPRAZolam Duanne Moron) 0.5 MG tablet Take 0.5 mg by mouth every 6 (six) hours as needed. for anxiety 04/18/17   [provider]  amLODipine (NORVASC) 10 MG tablet Take 10 mg by mouth daily. 03/05/16   [provider]  aspirin EC 81 MG tablet Take 81 mg by mouth daily.    [provider]  atorvastatin (LIPITOR) 20 MG tablet Take 20 mg by mouth daily.    [provider]  glimepiride (AMARYL) 2 MG tablet TAKE 1 TABLET BY MOUTH DAILY WITH FIRST MEAL 03/05/16   [provider]  glucose blood (CONTOUR NEXT TEST) test strip TEST TWO TIMES DAILY 01/03/16   [provider]  Insulin Glargine (TOUJEO SOLOSTAR) 300 UNIT/ML SOPN  Inject 80 Units into the skin every evening.     [provider]  losartan (COZAAR) 100 MG tablet Take 100 mg by mouth daily.    [provider]  metFORMIN (GLUCOPHAGE) 1000 MG tablet Take 1,000 mg by mouth 2 (two) times daily with a meal.    [provider]  TRULICITY 9.48 NI/6.2VO SOPN INJECT 1 (ONE) MILLILITER UNDER SKIN SUBCUTANEOUSLY ONCE WEEKLY 04/13/17   [provider]    Allergies Patient has no known allergies.  Family History  Problem Relation Age of Onset  . Diabetes Mother   . Heart disease Mother   . Hypertension Mother   . Heart disease Father   . Ovarian cancer Maternal Grandmother   . Kidney disease Neg Hx   . Prostate cancer Neg Hx     Social History Social History  Substance Use Topics  . Smoking status: Never Smoker  . Smokeless tobacco: Never Used  . Alcohol use 14.4 oz/week    24 Cans of beer per week     Comment: 3-4 beers a day ( "not every day") 2- 3 times a week    Review of Systems Constitutional: No fever/chills Eyes: No visual changes. ENT: No sore throat. Cardiovascular: Positive for chest pain. Respiratory: Denies shortness of breath. Gastrointestinal: No nausea, no vomiting.  Genitourinary: Negative for dysuria.  Musculoskeletal: Negative for back pain. Skin: Negative for rash. Neurological: Negative for headaches, focal weakness or numbness.   ____________________________________________   PHYSICAL EXAM:  VITAL SIGNS: ED Triage Vitals [07/13/17 1012]  Enc Vitals Group     BP      Pulse      Resp      Temp      Temp src      SpO2      Weight 227 lb (103 kg)     Height      Head Circumference      Peak Flow      Pain Score 2     Pain Loc      Pain Edu?      Excl. in [place]?     Constitutional: Alert and oriented. Well appearing and in no acute distress. Eyes: Conjunctivae are normal.  Head: Atraumatic. Nose: No congestion/rhinnorhea. Mouth/Throat: Mucous membranes are moist.   Neck:  Normal range of motion.  Cardiovascular: Normal rate, regular rhythm. Grossly normal heart sounds.  Good peripheral circulation. Respiratory: Normal respiratory effort.  No retractions. Lungs CTAB. Gastrointestinal: Soft and nontender. No distention.  Genitourinary: No CVA tenderness. Musculoskeletal: No lower extremity edema.  Extremities warm and well perfused. No calf tenderness.  Neurologic:  Normal speech and language. No gross focal neurologic deficits are appreciated.  Skin:  Skin is warm and dry. No rash noted. Psychiatric: Mood and affect are normal. Speech and behavior are normal.  ____________________________________________   LABS (all labs ordered are listed, but only abnormal results are  displayed)  Labs Reviewed  BASIC METABOLIC PANEL - Abnormal; Notable for the following:       Result Value   Sodium 134 (*)    Chloride 96 (*)    Glucose, Bld 192 (*)    All other components within normal limits  CBC  TROPONIN I   ____________________________________________  EKG  ED ECG REPORT I, Arta Silence, the attending physician, personally viewed and interpreted this ECG.  Date: 07/13/2017 EKG Time: 10;11 Rate: 78 Rhythm: normal sinus rhythm QRS Axis: normal Intervals: normal ST/T Wave abnormalities: normal Narrative Interpretation: unremarkable  ____________________________________________  RADIOLOGY  Negative CXR  ____________________________________________   PROCEDURES  Procedure(s) performed: No    Critical Care performed: No ____________________________________________   INITIAL IMPRESSION / ASSESSMENT AND PLAN / ED COURSE  Pertinent labs & imaging results that were available during my care of the patient were reviewed by me and considered in my medical decision making (see chart for details).  56 year old male, history of diabetes and hypertension, presents with 2 days atypical and nonexertional chest pain with no shortness of breath.  No other concerning associated symptoms. Vital signs are normal, patient is very well-appearing, and exam is unremarkable. EKG with no ischemic findings.  Chest x-ray unremarkable. First troponin is negative. Patient has no known prior history of coronary artery disease. Overall extremely low suspicion for ACS on this presentation.  Plan: Repeat troponin after 3 hours and reassess; anticipate discharge home with PMD follow-up if negative.    ----------------------------------------- 2:50 PM on 07/13/2017 -----------------------------------------  Patient reports pain has subsided. Continues to be well-appearing. Repeat troponin is negative. At this time, no evidence of ACS or other concerning acute cause of the chest pain.  Patient feels well to go home. Return precautions given. Patient states that he will follow-up with his primary care doctor.  ____________________________________________   FINAL CLINICAL IMPRESSION(S) / ED DIAGNOSES  Final diagnoses:  None      NEW MEDICATIONS STARTED DURING THIS VISIT:  New Prescriptions   No medications on file     Note:  This document was prepared using Dragon voice recognition software and may include unintentional dictation errors.    Arta Silence, MD 07/13/17 2203

## 2017-07-13 NOTE — ED Triage Notes (Addendum)
Pt to ed with c/o intermittent chest pain that is on the left side of chest and radiates to back and left arm x 4 days. Denies sob, reports lightheadedness.

## 2017-08-31 IMAGING — CT CT CHEST W/ CM
1 series · 15 of 34 positions shown, 19 images · IV contrast (isovue)
Comparison: Chest radiographs dated 01/24/2016

CLINICAL DATA: Staging testicular cancer, status post right
orchiectomy

EXAM:
CT CHEST WITH CONTRAST
TECHNIQUE: Multidetector CT imaging of the chest was performed during
intravenous contrast administration.
CONTRAST:  75 mL Isovue 370 IV

[Series 7: axial st. · axial · 0.79mm/px · z∈[-802,-524]mm · 15 of 165 slices shown, 19 images]
[im 13/165  mediastinal]
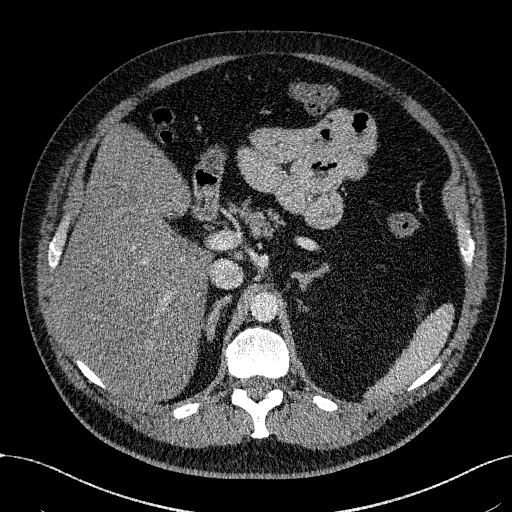
[im 13/165  lung]
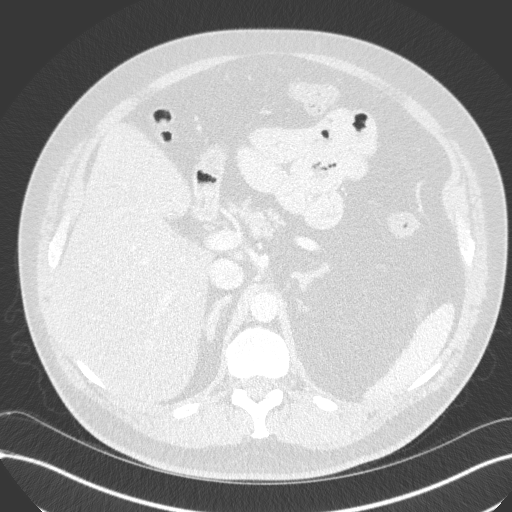
[im 25/165  lung]
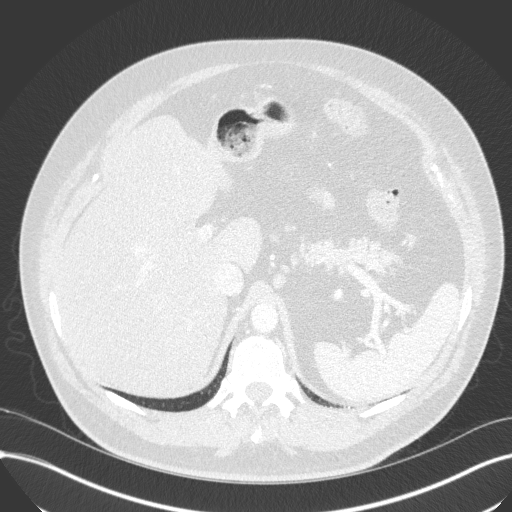
[im 33/165  lung]
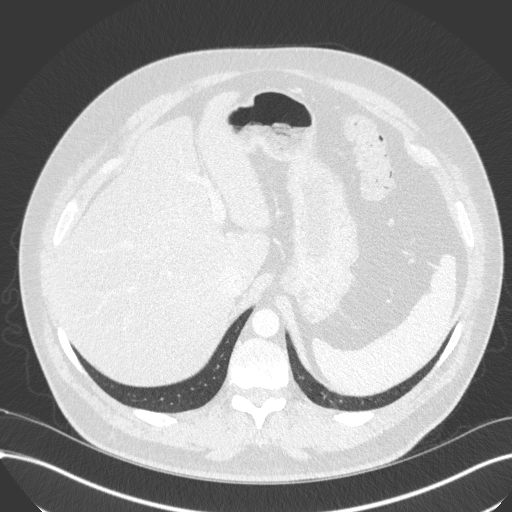
[im 43/165  lung]
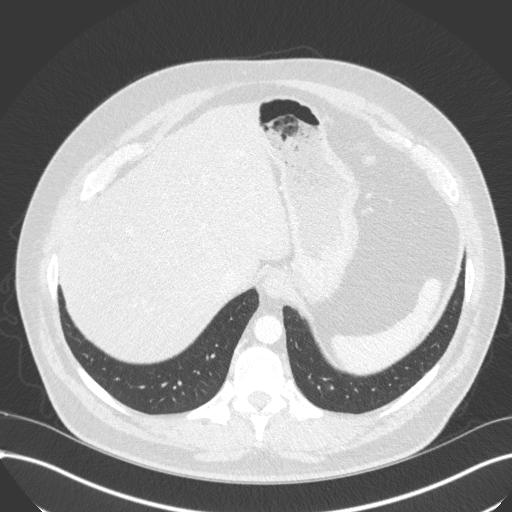
[im 55/165  mediastinal]
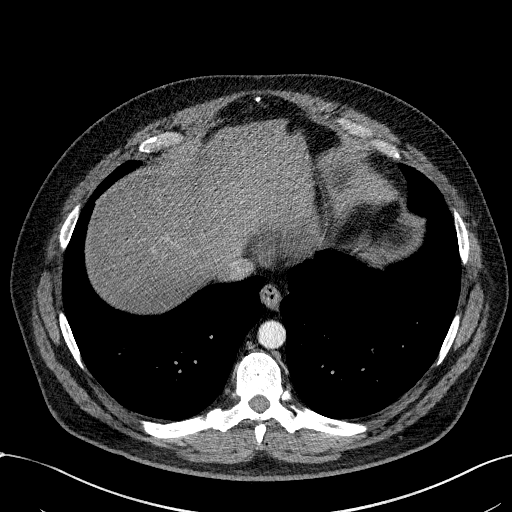
[im 55/165  lung]
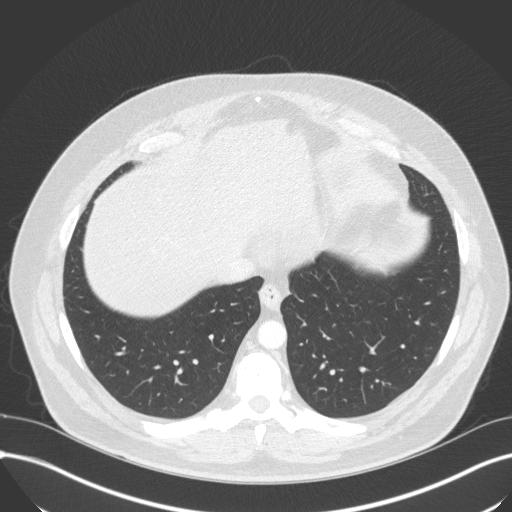
[im 66/165  lung]
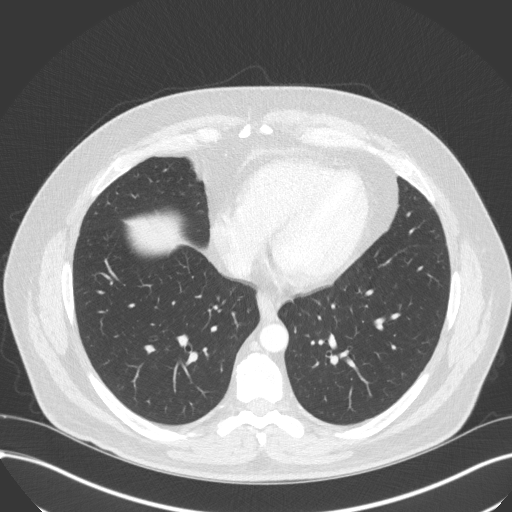
[im 73/165  lung]
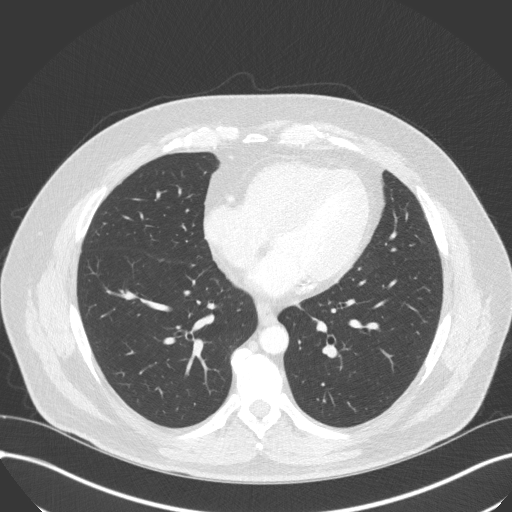
[im 86/165  lung]
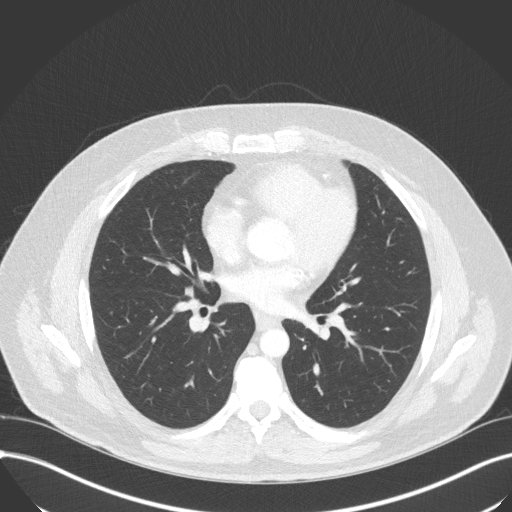
[im 92/165  mediastinal]
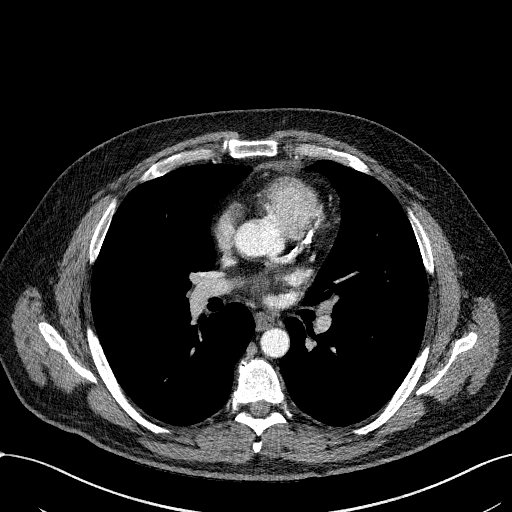
[im 92/165  lung]
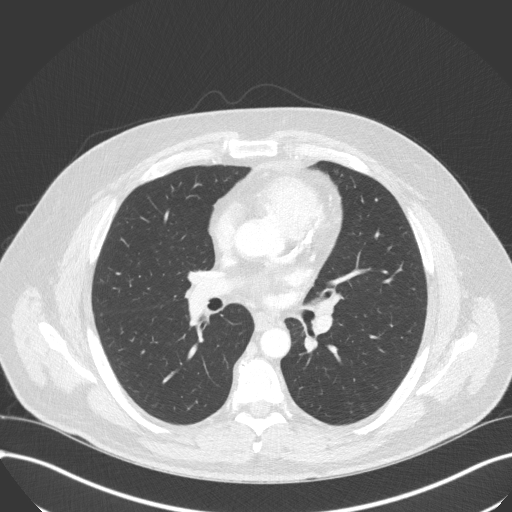
[im 99/165  lung]
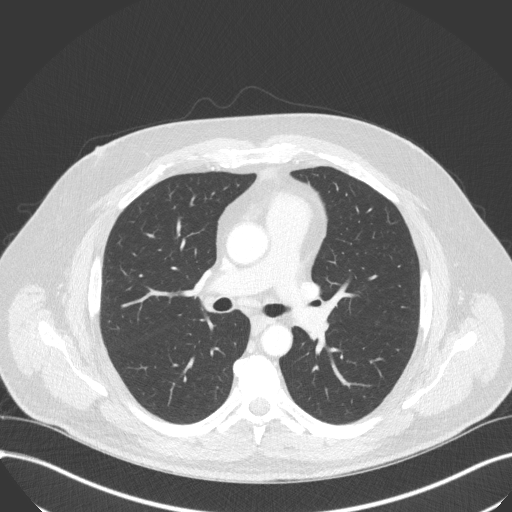
[im 110/165  lung]
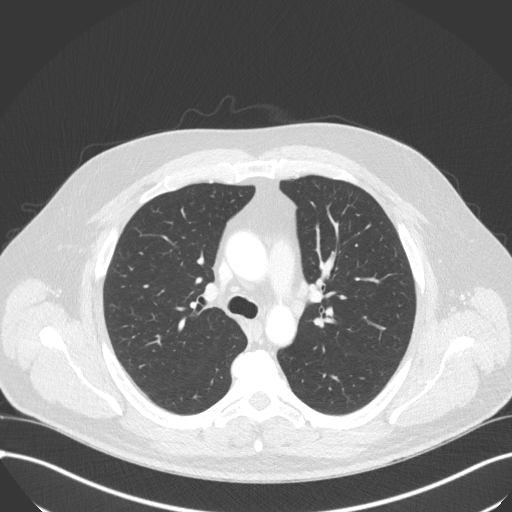
[im 122/165  lung]
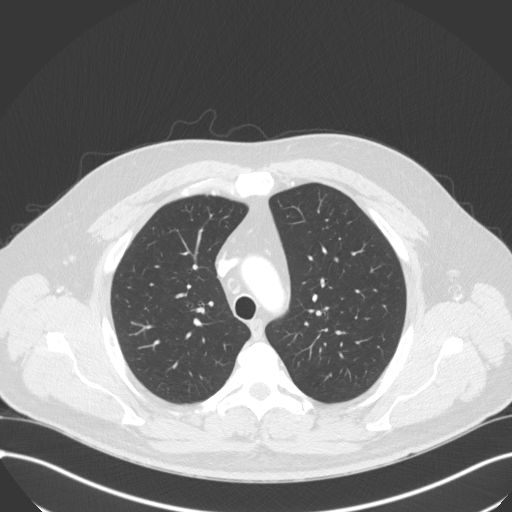
[im 132/165  mediastinal]
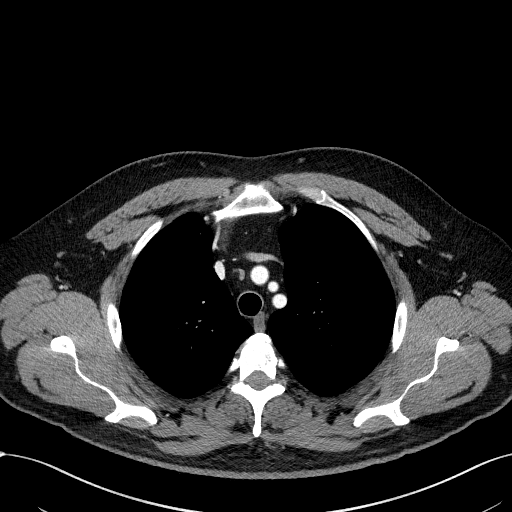
[im 132/165  lung]
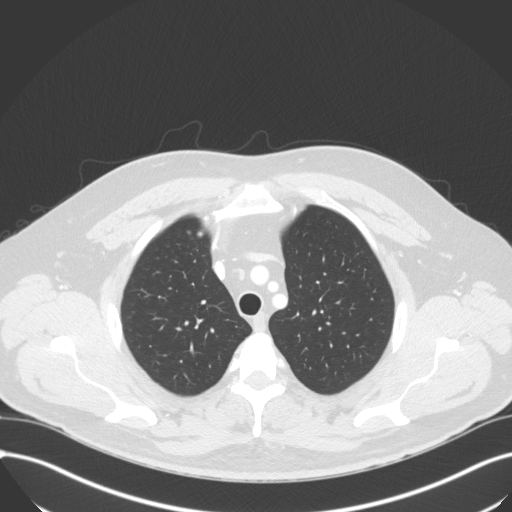
[im 140/165  lung]
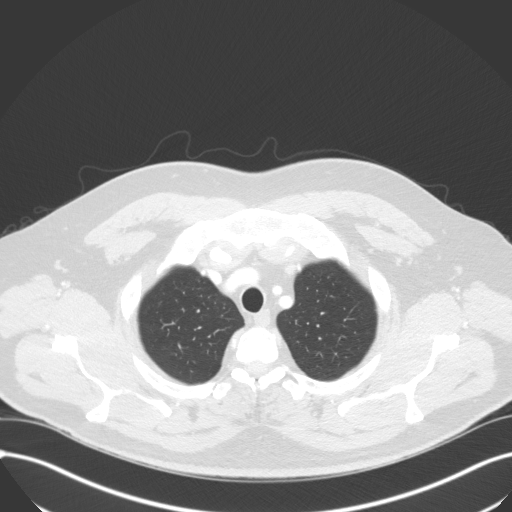
[im 152/165  lung]
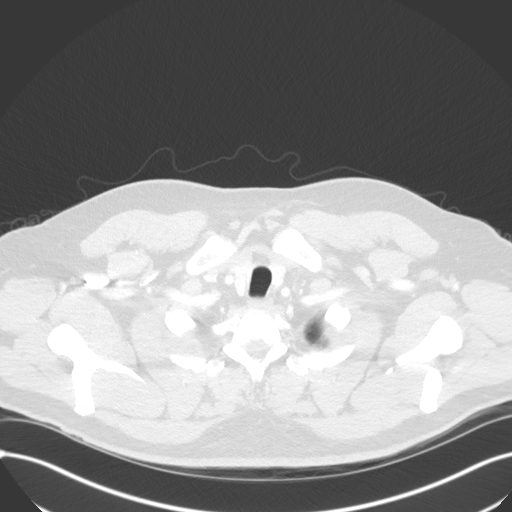

[15 of 34 positions shown; findings below may reference images not displayed]

FINDINGS: Mediastinum/Nodes: Heart is normal in size. No pericardial effusion.

Age advanced three-vessel coronary atherosclerosis, LAD predominant.

No evidence of thoracic aortic aneurysm.

Small mediastinal lymph nodes measuring up to 7 mm short axis
(series 7/ image 33), within normal limits.

Visualized thyroid is unremarkable.

Lungs/Pleura: Lungs are clear.

No suspicious pulmonary nodules.

No focal consolidation.

No pleural effusion or pneumothorax.

Upper abdomen: Visualized upper abdomen is notable for hepatic
steatosis and small upper abdominal nodes measuring up to 10 mm
short axis (series 7/image 147).

Musculoskeletal: Degenerative changes of the thoracic spine.
IMPRESSION: No evidence of metastatic disease in the chest.

Age advanced coronary atherosclerosis.

## 2017-09-24 ENCOUNTER — Encounter: Payer: Self-pay | Admitting: Nurse Practitioner

## 2017-09-28 ENCOUNTER — Ambulatory Visit
Admission: RE | Admit: 2017-09-28 | Discharge: 2017-09-28 | Disposition: A | Discharge status: Home / Self Care | Payer: BLUE CROSS/BLUE SHIELD | Source: Ambulatory Visit | Attending: Hematology and Oncology | Admitting: Hematology and Oncology

## 2017-09-28 DIAGNOSIS — C6211 Malignant neoplasm of descended right testis: Secondary | ICD-10-CM | POA: Insufficient documentation

## 2017-09-28 DIAGNOSIS — N200 Calculus of kidney: Secondary | ICD-10-CM | POA: Insufficient documentation

## 2017-09-28 DIAGNOSIS — R918 Other nonspecific abnormal finding of lung field: Secondary | ICD-10-CM | POA: Diagnosis not present

## 2017-09-28 LAB — POCT I-STAT CREATININE: Creatinine, Ser: 0.8 mg/dL (ref 0.61–1.24)

## 2017-09-28 MED ORDER — IOPAMIDOL (ISOVUE-370) INJECTION 76%
125.0000 mL | Freq: Once | INTRAVENOUS | Status: AC | PRN
  Administered 2017-09-28: 125 mL via INTRAVENOUS

## 2017-09-30 ENCOUNTER — Inpatient Hospital Stay: Payer: BLUE CROSS/BLUE SHIELD

## 2017-09-30 ENCOUNTER — Inpatient Hospital Stay: Payer: BLUE CROSS/BLUE SHIELD | Admitting: Hematology and Oncology

## 2017-10-07 ENCOUNTER — Inpatient Hospital Stay (HOSPITAL_BASED_OUTPATIENT_CLINIC_OR_DEPARTMENT_OTHER): Payer: BLUE CROSS/BLUE SHIELD | Admitting: Hematology and Oncology

## 2017-10-07 ENCOUNTER — Telehealth: Payer: Self-pay | Admitting: *Deleted

## 2017-10-07 ENCOUNTER — Other Ambulatory Visit: Payer: Self-pay

## 2017-10-07 ENCOUNTER — Other Ambulatory Visit: Payer: Self-pay | Admitting: *Deleted

## 2017-10-07 ENCOUNTER — Inpatient Hospital Stay: Payer: BLUE CROSS/BLUE SHIELD | Attending: Hematology and Oncology

## 2017-10-07 VITALS — BP 148/74 | HR 85 | Temp 97.8°F | Wt 251.7 lb

## 2017-10-07 DIAGNOSIS — D649 Anemia, unspecified: Secondary | ICD-10-CM | POA: Insufficient documentation

## 2017-10-07 DIAGNOSIS — E785 Hyperlipidemia, unspecified: Secondary | ICD-10-CM | POA: Diagnosis not present

## 2017-10-07 DIAGNOSIS — D122 Benign neoplasm of ascending colon: Secondary | ICD-10-CM | POA: Insufficient documentation

## 2017-10-07 DIAGNOSIS — K760 Fatty (change of) liver, not elsewhere classified: Secondary | ICD-10-CM | POA: Insufficient documentation

## 2017-10-07 DIAGNOSIS — Z79899 Other long term (current) drug therapy: Secondary | ICD-10-CM | POA: Diagnosis not present

## 2017-10-07 DIAGNOSIS — N200 Calculus of kidney: Secondary | ICD-10-CM | POA: Insufficient documentation

## 2017-10-07 DIAGNOSIS — F419 Anxiety disorder, unspecified: Secondary | ICD-10-CM | POA: Diagnosis not present

## 2017-10-07 DIAGNOSIS — Z794 Long term (current) use of insulin: Secondary | ICD-10-CM | POA: Diagnosis not present

## 2017-10-07 DIAGNOSIS — K648 Other hemorrhoids: Secondary | ICD-10-CM | POA: Insufficient documentation

## 2017-10-07 DIAGNOSIS — C6211 Malignant neoplasm of descended right testis: Secondary | ICD-10-CM

## 2017-10-07 DIAGNOSIS — G4730 Sleep apnea, unspecified: Secondary | ICD-10-CM | POA: Insufficient documentation

## 2017-10-07 DIAGNOSIS — Z9221 Personal history of antineoplastic chemotherapy: Secondary | ICD-10-CM | POA: Diagnosis not present

## 2017-10-07 DIAGNOSIS — I10 Essential (primary) hypertension: Secondary | ICD-10-CM | POA: Insufficient documentation

## 2017-10-07 DIAGNOSIS — H905 Unspecified sensorineural hearing loss: Secondary | ICD-10-CM | POA: Insufficient documentation

## 2017-10-07 DIAGNOSIS — E119 Type 2 diabetes mellitus without complications: Secondary | ICD-10-CM | POA: Diagnosis not present

## 2017-10-07 DIAGNOSIS — Z87442 Personal history of urinary calculi: Secondary | ICD-10-CM | POA: Diagnosis not present

## 2017-10-07 DIAGNOSIS — Z9079 Acquired absence of other genital organ(s): Secondary | ICD-10-CM | POA: Insufficient documentation

## 2017-10-07 DIAGNOSIS — Z7982 Long term (current) use of aspirin: Secondary | ICD-10-CM | POA: Diagnosis not present

## 2017-10-07 DIAGNOSIS — C6290 Malignant neoplasm of unspecified testis, unspecified whether descended or undescended: Secondary | ICD-10-CM

## 2017-10-07 LAB — CBC WITH DIFFERENTIAL/PLATELET
Basophils Absolute: 0.1 10*3/uL (ref 0–0.1)
Basophils Relative: 1 %
Eosinophils Absolute: 0.2 10*3/uL (ref 0–0.7)
Eosinophils Relative: 2 %
HCT: 37.5 % — ABNORMAL LOW (ref 40.0–52.0)
Hemoglobin: 13.4 g/dL (ref 13.0–18.0)
Lymphocytes Relative: 29 %
Lymphs Abs: 2 10*3/uL (ref 1.0–3.6)
MCH: 33 pg (ref 26.0–34.0)
MCHC: 35.7 g/dL (ref 32.0–36.0)
MCV: 92.5 fL (ref 80.0–100.0)
Monocytes Absolute: 0.4 10*3/uL (ref 0.2–1.0)
Monocytes Relative: 6 %
Neutro Abs: 4.2 10*3/uL (ref 1.4–6.5)
Neutrophils Relative %: 62 %
Platelets: 260 10*3/uL (ref 150–440)
RBC: 4.05 MIL/uL — ABNORMAL LOW (ref 4.40–5.90)
RDW: 12.8 % (ref 11.5–14.5)
WBC: 6.9 10*3/uL (ref 3.8–10.6)

## 2017-10-07 LAB — COMPREHENSIVE METABOLIC PANEL
ALT: 24 U/L (ref 17–63)
AST: 31 U/L (ref 15–41)
Albumin: 3.6 g/dL (ref 3.5–5.0)
Alkaline Phosphatase: 51 U/L (ref 38–126)
Anion gap: 10 (ref 5–15)
BUN: 16 mg/dL (ref 6–20)
CO2: 23 mmol/L (ref 22–32)
Calcium: 8.7 mg/dL — ABNORMAL LOW (ref 8.9–10.3)
Chloride: 95 mmol/L — ABNORMAL LOW (ref 101–111)
Creatinine, Ser: 0.84 mg/dL (ref 0.61–1.24)
GFR calc Af Amer: >60 mL/min (ref >60)
GFR calc non Af Amer: >60 mL/min (ref >60)
Glucose, Bld: 288 mg/dL — ABNORMAL HIGH (ref 65–99)
Potassium: 4 mmol/L (ref 3.5–5.1)
Sodium: 128 mmol/L — ABNORMAL LOW (ref 135–145)
Total Bilirubin: 0.5 mg/dL (ref 0.3–1.2)
Total Protein: 7.2 g/dL (ref 6.5–8.1)

## 2017-10-07 LAB — LACTATE DEHYDROGENASE: LDH: 157 U/L (ref 98–192)

## 2017-10-07 NOTE — Progress Notes (Signed)
[facility] day:  10/07/2017   Chief Complaint: Nathan Calderon is a 56 y.o. male with stage IB right testicular cancer who is seen for 6 month assessment and review of interval CT scans.  HPI:  The patient was last seen in the medical oncology clinic on 04/12/2017.  At that time, he noted some personal stressors.  He was doing well.  Exam was normal.   [facility] labs on 04/13/2017 revealed a normal CBC with diff, CMP, LDH, AFP, and beta-HCG.  He underwent screening colonoscopy on 05/12/2017 by Dr. Lucilla Lame.  There were two 3 to 9 mm polyps in the ascending colon, removed with a cold snare. Pathology revealed tubular adenomas.  There were non-bleeding internal hemorrhoids.  He was seen in the [facility] ER on 07/13/2017 with chest pain.  He had no evidence of ACS or concerning symptoms.  He was to follow-up with his PCP.  Chest, abdomen, and pelvic CT on 09/28/2017 revealed no evidence of metastasis.  There was waxing and waning mild airspace disease and RIGHT lung most consistent with post infectious or inflammatory process, overall improved.  There was a stable small gastrohepatic ligament lymph node and small LEFT renal calculus.  Symptomatically, he denies any symptoms.  Diet is good.  He denies any melena, hematochezia or hematuria.   Past Medical History:  Diagnosis Date  . Angina    2006, had cardiac cath at the time  . Anxiety   . Diabetes mellitus    Type 2  . Headache(784.0)   . Heart murmur    as child  . Hyperlipidemia   . Hypertension   . Kidney stone    hx of  . Motion sickness    ocean boats  . Sleep apnea    no CPAP.  Never got it.  . Testicular cancer ([place]) 04/08/2016    Past Surgical History:  Procedure Laterality Date  . CARDIAC CATHETERIZATION  2000   and 2006  . EYE SURGERY     x 2  . HERNIA REPAIR     with undecended testicle  . LASIK    . SHOULDER SURGERY     left shoulder    Family History  Problem  Relation Age of Onset  . Diabetes Mother   . Heart disease Mother   . Hypertension Mother   . Heart disease Father   . Ovarian cancer Maternal Grandmother   . Kidney disease Neg Hx   . Prostate cancer Neg Hx     Social History:  reports that  has never smoked. he has never used smokeless tobacco. He reports that he drinks about 14.4 oz of alcohol per week. He reports that he does not use drugs.  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  He worked 40 hours this past week.  He notes that his "insurance resets in 08/2016".  The patient is alone today.  Allergies: No Known Allergies  Current Medications: Current Outpatient Medications  Medication Sig Dispense Refill  . ALPRAZolam (XANAX) 0.5 MG tablet Take 0.5 mg by mouth every 6 (six) hours as needed. for anxiety  2  . amLODipine (NORVASC) 10 MG tablet Take 10 mg by mouth daily.  5  . aspirin EC 81 MG tablet Take 81  mg by mouth daily.    Marland Kitchen atorvastatin (LIPITOR) 20 MG tablet Take 20 mg by mouth daily.    Marland Kitchen glucose blood (CONTOUR NEXT TEST) test strip TEST TWO TIMES DAILY    . Insulin Glargine (TOUJEO SOLOSTAR) 300 UNIT/ML SOPN Inject 70 Units into the skin every evening.     Marland Kitchen losartan (COZAAR) 100 MG tablet Take 100 mg by mouth daily.     No current facility-administered medications for this visit.    Facility-Administered Medications Ordered in Other Visits  Medication Dose Route Frequency Provider Last Rate Last Dose  . 0.9 %  sodium chloride infusion   Intravenous Once Corcoran, Melissa C, MD      . 0.9 %  sodium chloride infusion   Intravenous Once Corcoran, Melissa C, MD      . bleomycin (BLEOCIN) 30 Units in sodium chloride 0.9 % 50 mL chemo infusion  30 Units Intravenous Once Corcoran, Melissa C, MD      . magnesium sulfate IVPB 2 g 50 mL  2 g Intravenous  Once Corcoran, Melissa C, MD      . ondansetron (ZOFRAN) 8 mg in sodium chloride 0.9 % 50 mL IVPB   Intravenous Once Corcoran, Melissa C, MD      . sodium chloride 0.9 % injection 10 mL  10 mL Intracatheter PRN Lequita Asal, MD        Review of Systems:  GENERAL:  Feels "good".  No fever or sweats.  Weigh up 18 pounds from last visit (down 1 pound from 7 months ago). PERFORMANCE STATUS (ECOG):  1 HEENT:  No visual changes, runny nose, sore throat, mouth sores or tenderness. Lungs:  No shortness of breath.  No cough.  No hemoptysis. Cardiac:  No chest pain, palpitations, orthopnea, or PND. GI:   No nausea, vomiting, constipation, melena or hematochezia.  No colonoscopy. GU:  No urgency, frequency, dysuria, or hematuria. Musculoskeletal:  No back pain.  No joint pain.  No muscle tenderness. Extremities:  No pain or swelling. Skin:  No rashes or skin changes. Neuro:  No headache, numbness or weakness, balance or coordination issues. Endocrine:  Diabetes.  No thyroid issues, hot flashes or night sweats. Psych:  Stress.  No mood changes, depression or anxiety. Pain:  No focal pain. Review of systems:  All other systems reviewed and found to be negative.  Physical Exam: Blood pressure (!) 148/74, pulse 85, temperature 97.8 F (36.6 C), temperature source Tympanic, weight 251 lb 11.2 oz (114.2 kg).  GENERAL:  Well developed, well nourished, gentleman sitting comfortably in the exam room in no acute distress. MENTAL STATUS:  Alert and oriented to person, place and time. HEAD:  Alopecia.  White beard. Normocephalic, atraumatic, face symmetric, no Cushingoid features. EYES:  Glasses.  Brown eyes.  No conjunctivitis or scleral icterus. RESPIRATORY:  Clear to auscultation without rales, wheezes or rhonchi. CARDIOVASCULAR:  Regular rate and rhythm without murmur, rub or gallop. ABDOMEN:  Soft, non-tender, with active bowel sounds, and no appreciable hepatosplenomegaly.  No  masses. GENITOURINARY:  Normal penis.  s/p right orchiectomy with right inguinal scarring. SKIN:  No rashes, ulcers or lesions. LYMPH NODES:  No palpable cervical, supraclavicular, axillary or inguinal adenopathy. EXTREMITIES: No edema, skin discoloration or tenderness.  No palpable cords. NEUROLOGICAL: Unremarkable. PSYCH:  Appropriate.   Appointment on 10/07/2017  Component Date Value Ref Range Status  . AFP, Serum, Tumor Marker 10/07/2017 2.6  0.0 - 8.3 ng/mL Final   Comment: (NOTE) Roche ECLIA methodology  Performed At: [facility] [place], [place] [id number] Rush Farmer MD [id number]   . Sodium 10/07/2017 128* 135 - 145 mmol/L Final  . Potassium 10/07/2017 4.0  3.5 - 5.1 mmol/L Final  . Chloride 10/07/2017 95* 101 - 111 mmol/L Final  . CO2 10/07/2017 23  22 - 32 mmol/L Final  . Glucose, Bld 10/07/2017 288* 65 - 99 mg/dL Final  . BUN 10/07/2017 16  6 - 20 mg/dL Final  . Creatinine, Ser 10/07/2017 0.84  0.61 - 1.24 mg/dL Final  . Calcium 10/07/2017 8.7* 8.9 - 10.3 mg/dL Final  . Total Protein 10/07/2017 7.2  6.5 - 8.1 g/dL Final  . Albumin 10/07/2017 3.6  3.5 - 5.0 g/dL Final  . AST 10/07/2017 31  15 - 41 U/L Final  . ALT 10/07/2017 24  17 - 63 U/L Final  . Alkaline Phosphatase 10/07/2017 51  38 - 126 U/L Final  . Total Bilirubin 10/07/2017 0.5  0.3 - 1.2 mg/dL Final  . GFR calc non Af Amer 10/07/2017 >60  >60 mL/min Final  . GFR calc Af Amer 10/07/2017 >60  >60 mL/min Final   Comment: (NOTE) The eGFR has been calculated using the CKD EPI equation. This calculation has not been validated in all clinical situations. eGFR's persistently <60 mL/min signify possible Chronic Kidney Disease.   . Anion gap 10/07/2017 10  5 - 15 Final  . WBC 10/07/2017 6.9  3.8 - 10.6 K/uL Final  . RBC 10/07/2017 4.05* 4.40 - 5.90 MIL/uL Final  . Hemoglobin 10/07/2017 13.4  13.0 - 18.0 g/dL Final  . HCT 10/07/2017 37.5* 40.0 - 52.0 % Final  . MCV 10/07/2017 92.5   80.0 - 100.0 fL Final  . MCH 10/07/2017 33.0  26.0 - 34.0 pg Final  . MCHC 10/07/2017 35.7  32.0 - 36.0 g/dL Final  . RDW 10/07/2017 12.8  11.5 - 14.5 % Final  . Platelets 10/07/2017 260  150 - 440 K/uL Final  . Neutrophils Relative % 10/07/2017 62  % Final  . Neutro Abs 10/07/2017 4.2  1.4 - 6.5 K/uL Final  . Lymphocytes Relative 10/07/2017 29  % Final  . Lymphs Abs 10/07/2017 2.0  1.0 - 3.6 K/uL Final  . Monocytes Relative 10/07/2017 6  % Final  . Monocytes Absolute 10/07/2017 0.4  0.2 - 1.0 K/uL Final  . Eosinophils Relative 10/07/2017 2  % Final  . Eosinophils Absolute 10/07/2017 0.2  0 - 0.7 K/uL Final  . Basophils Relative 10/07/2017 1  % Final  . Basophils Absolute 10/07/2017 0.1  0 - 0.1 K/uL Final  . hCG Quant 10/07/2017 <1  0 - 3 mIU/mL Final   Comment: (NOTE) Roche ECLIA methodology Performed At: [facility] [address] [place], [place] [id number] Rush Farmer MD [id number]   . LDH 10/07/2017 157  98 - 192 U/L Final    Assessment:  Nathan Calderon is a 56 y.o. male with stage IB right testicular cancer s/p orchiectomy on 04/08/2016.  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  There was lymphovascular invasion. Pathologic stage was T2Nx (stage IB).    Abdominal and pelvic CT scan on 04/15/2016 and chest CT on 05/03/2016 revealed no evidence of metastatic disease.   Labs on 04/02/2016 revealed a normal AFP (2.2), beta-hCG (<1) and LDH (203).  Labs on 06/07/2016 revealed a normal AFP (4.9) and beta-hCG (<1).  PFTs on 05/13/2016 revealed FVC 3.82 liters (75%),  FEV1 3.06 liters (84%), TLC 5.5  liters (77%), VC 4.00 liters (79%), and DLCO 28.6 ml/mmHg/min (93%).  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  He received 2 cycles of cycles of BEP (05/17/2016 - 06/07/2016).    Labs on 06/07/2016 revealed an elevated LDH of 450 (98-192).   Follow-up testing on 06/08/2016 revealed a normal CPK (50) and a slightly elevated troponin (0.10).  He was seen by cardiology on 06/08/2016.  LDH was 234 on 06/10/2016.  CXR on 06/28/2016 revealed no active cardiopulmonary disease.  Chest CT without contrast on 09/27/2016 revealed interval enlargement of multiple small mediastinal, hilar and upper abdominal lymph nodes.  Lymph nodes included paratracheal (7 mm to 12 mm), AP window node (6 mm to 10 mm), subcarinal (7 mm to 11 mm), hilar (more prominent), gastrohepatic ligament node (10 mm to 12 mm and 9 mm to 10 mm).  There were no suspicious pulmonary findings.  There was hepatic steatosis.  Abdomen and pelvic CT on 09/28/2016 revealed minimal enlargement of borderline gastrohepatic ligament node, not in the typical drainage pattern for testicular cancer. Likely reactive and possibly related to hepatic steatosis.  He was s/p right orchiectomy without typical findings of metastatic disease.  There was left nephrolithiasis.  Chest CT on 04/05/2017 revealed interval development of peribronchovascular nodularity right middle and lower lobes. Imaging features suggested atypical infection.  There was an interval decrease in the mediastinal lymph nodes, now measuring within normal limits for size.  There were borderline upper abdominal lymph nodes stable to slightly decreased in the interval.   Chest, abdomen, and pelvic CT on 09/28/2017 revealed no evidence of metastasis.  There was waxing and waning mild airspace disease and RIGHT lung most consistent with post infectious or inflammatory process, overall improved.  There was a stable small gastrohepatic ligament lymph node and small LEFT renal calculus.  LDH has been followed: 203 on 04/02/2016, 169 on 05/17/2016, 4:15 on 06/07/2016, 393 on 06/08/2016, to 34 on 06/10/2016, for 70 on 06/21/2016, 140 on 07/23/2016, 157 on 09/28/2016, 202 on 04/13/2017, and 157 on 10/07/2017.    AFP has been followed:  2.2 on 04/02/2016,  2.1 on 05/17/2016, 4.9 on 06/07/2016, 1.7 on 07/23/2016, 2.1 on 09/28/2016, 1.4 on 04/13/2017, and 2.6 on 10/07/2017.  Beta-HCG has been followed: <1 on 05/17/2016, <1 on 06/07/2016, <1 on 07/23/2016, <1 on 09/28/2016, < 1 on 04/13/2017, and < 1 on 10/07/2017.  He has a normocytic anemia likely secondary to chemotherapy.  Labs on 05/19/2016 revealed a normal ferritin (186), B12 (350), folate (20.2), and TSH (2.737).  Reticulocyte count was 2.4%.  Hematocrit has improved from 28.0 to 29.3 to 31.2 to 37.5.  Diet is good.  He denies any melena, hematochezia or hematuria.  Colonoscopy on 05/12/2017 revealed two 3 to 9 mm polyps in the ascending colon. Pathology revealed tubular adenomas.  There were non-bleeding internal hemorrhoids.  Symptomatically, he is doing well.  Exam is normal.   Plan: 1.  Labs today:  CBC with diff, CMP, Mg, LDH, beta HCG, AFP.  2.  Review interval CT scans- no evidence of metastatic disease. 3.  CXR before next RTC visit. 4.  RTC in 6 months for MD assessment, labs (CBC with diff, CMP, Mg, LDH, beta HCG, AFP), and review of CXR.   Addendum:  Patient's sodium noted to be low (128) after his appointment.  Labs to be forwarded to his PCP.  Patient notified.   Honor Loh, NP 10/07/2017,  2:25 PM  I saw and evaluated the patient, participating in the key portions of the service and reviewing pertinent diagnostic studies and records.  I reviewed the nurse practitioner's note and agree with the findings and the plan.  The assessment and plan were discussed with the patient.  A few questions were asked by the patient and answered.   [name], MD 10/07/2017, 2:25 PM

## 2017-10-07 NOTE — Telephone Encounter (Signed)
Called pt and got his voicemail and left message that pt na level was 128.  DrMarland Kitchen [name] wanted him to know it is low and we will send labs to your PCP for her review and if she needs to intervene or monitor levels for you going forward.   I also sent inbasket to NP Lam to let her know that levels were low and would appreciate reviewing and any intervention they feel is appropriate

## 2017-10-07 NOTE — Telephone Encounter (Signed)
-----   Message from Shirlean Kelly, RN sent at 10/07/2017  5:19 PM EST ----- Regarding: FW: Please call patient and send CMP to PCP   ----- Message ----- From: Lequita Asal, MD Sent: 10/07/2017   2:47 PM To: Karen Kitchens, NP, Shirlean Kelly, RN Subject: Please call patient and send CMP to PCP         Sodium low.  M  ----- Message ----- From: Interface, Lab In [place] Sent: 10/07/2017   1:35 PM To: Lequita Asal, MD

## 2017-10-08 LAB — AFP TUMOR MARKER: AFP, Serum, Tumor Marker: 2.6 ng/mL (ref 0.0–8.3)

## 2017-10-08 LAB — BETA HCG QUANT (REF LAB): hCG Quant: <1 m[IU]/mL (ref 0–3)

## 2017-10-09 ENCOUNTER — Encounter: Payer: Self-pay | Admitting: Hematology and Oncology

## 2017-11-07 ENCOUNTER — Inpatient Hospital Stay: Payer: BLUE CROSS/BLUE SHIELD | Attending: Hematology and Oncology

## 2018-04-07 ENCOUNTER — Inpatient Hospital Stay: Payer: Self-pay | Attending: Hematology and Oncology

## 2018-04-07 ENCOUNTER — Inpatient Hospital Stay: Payer: Self-pay | Admitting: Hematology and Oncology

## 2018-04-07 ENCOUNTER — Inpatient Hospital Stay: Payer: Self-pay

## 2018-04-07 NOTE — Progress Notes (Deleted)
[facility] day:  04/07/2018  Chief Complaint: Nathan Calderon is a 57 y.o. male with stage IB right testicular cancer who is seen for 6 month assessment.  HPI:  The patient was last seen in the medical oncology clinic on 10/07/2017.  At that time, he was doing well.  Exam was normal. LDH, AFP, and beta-HCG were normal.  CT scans were normal.  Sodium was 128.  During the interim,    Past Medical History:  Diagnosis Date  . Angina    2006, had cardiac cath at the time  . Anxiety   . Diabetes mellitus    Type 2  . Headache(784.0)   . Heart murmur    as child  . Hyperlipidemia   . Hypertension   . Kidney stone    hx of  . Motion sickness    ocean boats  . Sleep apnea    no CPAP.  Never got it.  . Testicular cancer ([place]) 04/08/2016    Past Surgical History:  Procedure Laterality Date  . CARDIAC CATHETERIZATION  2000   and 2006  . COLONOSCOPY WITH PROPOFOL N/A 05/12/2017   Procedure: COLONOSCOPY WITH PROPOFOL;  Surgeon: Lucilla Lame, MD;  Location: [facility];  Service: Endoscopy;  Laterality: N/A;  Cannot arrive before 8:45 Diabetic - insulin and oral meds sleep apnea  . EYE SURGERY     x 2  . HERNIA REPAIR     with undecended testicle  . LASIK    . ORCHIECTOMY Right 04/08/2016   Procedure: ORCHIECTOMY/ INGUINAL APPROACH;  Surgeon: [name], MD;  Location: [facility];  Service: Urology;  Laterality: Right;  . PARS PLANA VITRECTOMY  04/04/2012   Procedure: PARS PLANA VITRECTOMY WITH 25 GAUGE;  Surgeon: [name], MD;  Location: [facility];  Service: Ophthalmology;  Laterality: Left;  with laser  . POLYPECTOMY  05/12/2017   Procedure: POLYPECTOMY;  Surgeon: Lucilla Lame, MD;  Location: [facility];  Service: Endoscopy;;  . SHOULDER SURGERY     left shoulder  . VASECTOMY Left 04/08/2016   Procedure: VASECTOMY;  Surgeon: [name], MD;  Location: [facility];  Service: Urology;  Laterality: Left;    Family  History  Problem Relation Age of Onset  . Diabetes Mother   . Heart disease Mother   . Hypertension Mother   . Heart disease Father   . Ovarian cancer Maternal Grandmother   . Kidney disease Neg Hx   . Prostate cancer Neg Hx     Social History:  reports that he has never smoked. He has never used smokeless tobacco. He reports that he drinks about 14.4 oz of alcohol per week. He reports that he does not use drugs.  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  He worked 40 hours this past week.  He notes that his "insurance resets in 08/2016".  The patient is alone today.  Allergies: No Known Allergies  Current Medications: Current Outpatient Medications  Medication Sig Dispense Refill  . ALPRAZolam (XANAX) 0.5 MG tablet Take 0.5 mg by mouth every 6 (six) hours as needed. for anxiety  2  . amLODipine (NORVASC) 10 MG tablet Take 10 mg by mouth daily.  5  . aspirin EC 81 MG tablet Take  81 mg by mouth daily.    Marland Kitchen atorvastatin (LIPITOR) 20 MG tablet Take 20 mg by mouth daily.    Marland Kitchen glucose blood (CONTOUR NEXT TEST) test strip TEST TWO TIMES DAILY    . Insulin Glargine (TOUJEO SOLOSTAR) 300 UNIT/ML SOPN Inject 70 Units into the skin every evening.     Marland Kitchen losartan (COZAAR) 100 MG tablet Take 100 mg by mouth daily.     No current facility-administered medications for this visit.    Facility-Administered Medications Ordered in Other Visits  Medication Dose Route Frequency Provider Last Rate Last Dose  . 0.9 %  sodium chloride infusion   Intravenous Once Corcoran, Melissa C, MD      . 0.9 %  sodium chloride infusion   Intravenous Once Corcoran, Melissa C, MD      . bleomycin (BLEOCIN) 30 Units in sodium chloride 0.9 % 50 mL chemo infusion  30 Units Intravenous Once Corcoran, Melissa C, MD      . magnesium sulfate IVPB 2 g 50  mL  2 g Intravenous Once Corcoran, Melissa C, MD      . ondansetron (ZOFRAN) 8 mg in sodium chloride 0.9 % 50 mL IVPB   Intravenous Once Corcoran, Melissa C, MD      . sodium chloride 0.9 % injection 10 mL  10 mL Intracatheter PRN Lequita Asal, MD        Review of Systems:  GENERAL:  Feels "good".  No fever or sweats.  Weigh up 18 pounds from last visit (down 1 pound from 7 months ago). PERFORMANCE STATUS (ECOG):  1 HEENT:  No visual changes, runny nose, sore throat, mouth sores or tenderness. Lungs:  No shortness of breath.  No cough.  No hemoptysis. Cardiac:  No chest pain, palpitations, orthopnea, or PND. GI:   No nausea, vomiting, constipation, melena or hematochezia.  No colonoscopy. GU:  No urgency, frequency, dysuria, or hematuria. Musculoskeletal:  No back pain.  No joint pain.  No muscle tenderness. Extremities:  No pain or swelling. Skin:  No rashes or skin changes. Neuro:  No headache, numbness or weakness, balance or coordination issues. Endocrine:  Diabetes.  No thyroid issues, hot flashes or night sweats. Psych:  Stress.  No mood changes, depression or anxiety. Pain:  No focal pain. Review of systems:  All other systems reviewed and found to be negative.  GENERAL:  Feels good.  Active.  No fevers, sweats or weight loss. PERFORMANCE STATUS (ECOG):  *** HEENT:  No visual changes, runny nose, sore throat, mouth sores or tenderness. Lungs: No shortness of breath or cough.  No hemoptysis. Cardiac:  No chest pain, palpitations, orthopnea, or PND. GI:  No nausea, vomiting, diarrhea, constipation, melena or hematochezia. GU:  No urgency, frequency, dysuria, or hematuria. Musculoskeletal:  No back pain.  No joint pain.  No muscle tenderness. Extremities:  No pain or swelling. Skin:  No rashes or skin changes. Neuro:  No headache, numbness or weakness, balance or coordination issues. Endocrine:  No diabetes, thyroid issues, hot flashes or night sweats. Psych:  No mood  changes, depression or anxiety. Pain:  No focal pain. Review of systems:  All other systems reviewed and found to be negative.   Physical Exam: There were no vitals taken for this visit.  GENERAL:  Well developed, well nourished, gentleman sitting comfortably in the exam room in no acute distress. MENTAL STATUS:  Alert and oriented to person, place and time. HEAD:  Alopecia.  White beard. Normocephalic, atraumatic,  face symmetric, no Cushingoid features. EYES:  Glasses.  Brown eyes.  No conjunctivitis or scleral icterus. RESPIRATORY:  Clear to auscultation without rales, wheezes or rhonchi. CARDIOVASCULAR:  Regular rate and rhythm without murmur, rub or gallop. ABDOMEN:  Soft, non-tender, with active bowel sounds, and no appreciable hepatosplenomegaly.  No masses. GENITOURINARY:  Normal penis.  s/p right orchiectomy with right inguinal scarring. SKIN:  No rashes, ulcers or lesions. LYMPH NODES:  No palpable cervical, supraclavicular, axillary or inguinal adenopathy. EXTREMITIES: No edema, skin discoloration or tenderness.  No palpable cords. NEUROLOGICAL: Unremarkable. PSYCH:  Appropriate.  GENERAL:  Well developed, well nourished, gentleman sitting comfortably in the exam room in no acute distress. MENTAL STATUS:  Alert and oriented to person, place and time. HEAD:  *** hair.  Normocephalic, atraumatic, face symmetric, no Cushingoid features. EYES:  *** eyes.  Pupils equal round and reactive to light and accomodation.  No conjunctivitis or scleral icterus. ENT:  Oropharynx clear without lesion.  Tongue normal. Mucous membranes moist.  RESPIRATORY:  Clear to auscultation without rales, wheezes or rhonchi. CARDIOVASCULAR:  Regular rate and rhythm without murmur, rub or gallop. ABDOMEN:  Soft, non-tender, with active bowel sounds, and no hepatosplenomegaly.  No masses. SKIN:  No rashes, ulcers or lesions. EXTREMITIES: No edema, no skin discoloration or tenderness.  No palpable  cords. LYMPH NODES: No palpable cervical, supraclavicular, axillary or inguinal adenopathy  NEUROLOGICAL: Unremarkable. PSYCH:  Appropriate.    No visits with results within 3 Day(s) from this visit.  Latest known visit with results is:  Appointment on 10/07/2017  Component Date Value Ref Range Status  . AFP, Serum, Tumor Marker 10/07/2017 2.6  0.0 - 8.3 ng/mL Final   Comment: (NOTE) Roche ECLIA methodology Performed At: [facility] [place], [place] [id number] Rush Farmer MD [id number]   . Sodium 10/07/2017 128* 135 - 145 mmol/L Final  . Potassium 10/07/2017 4.0  3.5 - 5.1 mmol/L Final  . Chloride 10/07/2017 95* 101 - 111 mmol/L Final  . CO2 10/07/2017 23  22 - 32 mmol/L Final  . Glucose, Bld 10/07/2017 288* 65 - 99 mg/dL Final  . BUN 10/07/2017 16  6 - 20 mg/dL Final  . Creatinine, Ser 10/07/2017 0.84  0.61 - 1.24 mg/dL Final  . Calcium 10/07/2017 8.7* 8.9 - 10.3 mg/dL Final  . Total Protein 10/07/2017 7.2  6.5 - 8.1 g/dL Final  . Albumin 10/07/2017 3.6  3.5 - 5.0 g/dL Final  . AST 10/07/2017 31  15 - 41 U/L Final  . ALT 10/07/2017 24  17 - 63 U/L Final  . Alkaline Phosphatase 10/07/2017 51  38 - 126 U/L Final  . Total Bilirubin 10/07/2017 0.5  0.3 - 1.2 mg/dL Final  . GFR calc non Af Amer 10/07/2017 >60  >60 mL/min Final  . GFR calc Af Amer 10/07/2017 >60  >60 mL/min Final   Comment: (NOTE) The eGFR has been calculated using the CKD EPI equation. This calculation has not been validated in all clinical situations. eGFR's persistently <60 mL/min signify possible Chronic Kidney Disease.   . Anion gap 10/07/2017 10  5 - 15 Final  . WBC 10/07/2017 6.9  3.8 - 10.6 K/uL Final  . RBC 10/07/2017 4.05* 4.40 - 5.90 MIL/uL Final  . Hemoglobin 10/07/2017 13.4  13.0 - 18.0 g/dL Final  . HCT 10/07/2017 37.5* 40.0 - 52.0 % Final  . MCV 10/07/2017 92.5  80.0 - 100.0 fL Final  . MCH 10/07/2017 33.0  26.0 - 34.0 pg  Final  . MCHC 10/07/2017 35.7  32.0 - 36.0  g/dL Final  . RDW 10/07/2017 12.8  11.5 - 14.5 % Final  . Platelets 10/07/2017 260  150 - 440 K/uL Final  . Neutrophils Relative % 10/07/2017 62  % Final  . Neutro Abs 10/07/2017 4.2  1.4 - 6.5 K/uL Final  . Lymphocytes Relative 10/07/2017 29  % Final  . Lymphs Abs 10/07/2017 2.0  1.0 - 3.6 K/uL Final  . Monocytes Relative 10/07/2017 6  % Final  . Monocytes Absolute 10/07/2017 0.4  0.2 - 1.0 K/uL Final  . Eosinophils Relative 10/07/2017 2  % Final  . Eosinophils Absolute 10/07/2017 0.2  0 - 0.7 K/uL Final  . Basophils Relative 10/07/2017 1  % Final  . Basophils Absolute 10/07/2017 0.1  0 - 0.1 K/uL Final  . hCG Quant 10/07/2017 <1  0 - 3 mIU/mL Final   Comment: (NOTE) Roche ECLIA methodology Performed At: [facility] [address] [place], [place] [id number] Rush Farmer MD SA:[id number]   . LDH 10/07/2017 157  98 - 192 U/L Final    Assessment:  Nathan Calderon is a 57 y.o. male with stage IB right testicular cancer s/p orchiectomy on 04/08/2016.  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  There was lymphovascular invasion. Pathologic stage was T2Nx (stage IB).    Abdominal and pelvic CT scan on 04/15/2016 and chest CT on 05/03/2016 revealed no evidence of metastatic disease.   Labs on 04/02/2016 revealed a normal AFP (2.2), beta-hCG (<1) and LDH (203).  Labs on 06/07/2016 revealed a normal AFP (4.9) and beta-hCG (<1).  PFTs on 05/13/2016 revealed FVC 3.82 liters (75%), FEV1 3.06 liters (84%), TLC 5.5  liters (77%), VC 4.00 liters (79%), and DLCO 28.6 ml/mmHg/min (93%).  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  He received 2 cycles of cycles of BEP (05/17/2016 - 06/07/2016).    Labs on 06/07/2016 revealed an elevated LDH of 450 (98-192).  Follow-up testing on 06/08/2016 revealed a normal CPK (50) and a slightly elevated troponin (0.10).  He was seen  by cardiology on 06/08/2016.  LDH was 234 on 06/10/2016.  CXR on 06/28/2016 revealed no active cardiopulmonary disease.  Chest CT without contrast on 09/27/2016 revealed interval enlargement of multiple small mediastinal, hilar and upper abdominal lymph nodes.  Lymph nodes included paratracheal (7 mm to 12 mm), AP window node (6 mm to 10 mm), subcarinal (7 mm to 11 mm), hilar (more prominent), gastrohepatic ligament node (10 mm to 12 mm and 9 mm to 10 mm).  There were no suspicious pulmonary findings.  There was hepatic steatosis.  Abdomen and pelvic CT on 09/28/2016 revealed minimal enlargement of borderline gastrohepatic ligament node, not in the typical drainage pattern for testicular cancer. Likely reactive and possibly related to hepatic steatosis.  He was s/p right orchiectomy without typical findings of metastatic disease.  There was left nephrolithiasis.  Chest CT on 04/05/2017 revealed interval development of peribronchovascular nodularity right middle and lower lobes. Imaging features suggested atypical infection.  There was an interval decrease in the mediastinal lymph nodes, now measuring within normal limits for size.  There were borderline upper abdominal lymph nodes stable to slightly decreased in the interval.   Chest, abdomen, and pelvic CT on 09/28/2017 revealed no evidence of metastasis.  There was waxing and waning mild airspace disease and RIGHT lung most consistent with post  infectious or inflammatory process, overall improved.  There was a stable small gastrohepatic ligament lymph node and small LEFT renal calculus.  LDH has been followed: 203 on 04/02/2016, 169 on 05/17/2016, 4:15 on 06/07/2016, 393 on 06/08/2016, to 34 on 06/10/2016, for 70 on 06/21/2016, 140 on 07/23/2016, 157 on 09/28/2016, 202 on 04/13/2017, and 157 on 10/07/2017.    AFP has been followed:  2.2 on 04/02/2016, 2.1 on 05/17/2016, 4.9 on 06/07/2016, 1.7 on 07/23/2016, 2.1 on 09/28/2016, 1.4 on 04/13/2017, and 2.6 on  10/07/2017.  Beta-HCG has been followed: <1 on 05/17/2016, <1 on 06/07/2016, <1 on 07/23/2016, <1 on 09/28/2016, < 1 on 04/13/2017, and < 1 on 10/07/2017.  He has a normocytic anemia likely secondary to chemotherapy.  Labs on 05/19/2016 revealed a normal ferritin (186), B12 (350), folate (20.2), and TSH (2.737).  Reticulocyte count was 2.4%.  Hematocrit has improved from 28.0 to 29.3 to 31.2 to 37.5.  Diet is good.  He denies any melena, hematochezia or hematuria.  Colonoscopy on 05/12/2017 revealed two 3 to 9 mm polyps in the ascending colon. Pathology revealed tubular adenomas.  There were non-bleeding internal hemorrhoids.  Symptomatically, he is doing well.  Exam is normal.   Plan: 1.  Labs today:  CBC with diff, CMP, Mg, LDH, beta HCG, AFP.  2.  Continue surveillance:  clinic evaluation with tumor markers every 3 months (year 1-2), every 6 months (year 3-4), and annual (year 5).  Annuual abdominal/pelvic CT scan (year 1-2).  CXR or chest CT every 6 months (year 1) and annual (year 2). 3.     Honor Loh, NP 04/07/2018, 6:24 AM   I saw and evaluated the patient, participating in the key portions of the service and reviewing pertinent diagnostic studies and records.  I reviewed the nurse practitioner's note and agree with the findings and the plan.  The assessment and plan were discussed with the patient.  A few questions were asked by the patient and answered.   [name], MD 04/07/2018, 6:24 AM

## 2018-07-04 ENCOUNTER — Other Ambulatory Visit: Payer: Self-pay | Admitting: Urgent Care

## 2018-07-04 ENCOUNTER — Telehealth: Payer: Self-pay | Admitting: *Deleted

## 2018-07-04 DIAGNOSIS — C6290 Malignant neoplasm of unspecified testis, unspecified whether descended or undescended: Secondary | ICD-10-CM

## 2018-07-04 NOTE — Telephone Encounter (Signed)
Called and left a message on patient's vmail. In regards to getting his appts R/S.

## 2018-07-05 ENCOUNTER — Telehealth: Payer: Self-pay | Admitting: *Deleted

## 2018-07-05 NOTE — Telephone Encounter (Signed)
Patient called and stated that he spoke with Barnabas Lister) in regards to some Insurance forms and that once he gets that taken care of he will call back to get his lab/MD appts scheduled.

## 2018-11-29 ENCOUNTER — Emergency Department: Payer: Self-pay

## 2018-11-29 ENCOUNTER — Encounter: Payer: Self-pay | Admitting: Emergency Medicine

## 2018-11-29 ENCOUNTER — Other Ambulatory Visit: Payer: Self-pay

## 2018-11-29 ENCOUNTER — Inpatient Hospital Stay
Admission: EM | Admit: 2018-11-29 | Discharge: 2018-12-02 | DRG: 638 | Disposition: A | Discharge status: Home / Self Care | Payer: Self-pay | Attending: Internal Medicine | Admitting: Internal Medicine

## 2018-11-29 DIAGNOSIS — I10 Essential (primary) hypertension: Secondary | ICD-10-CM | POA: Diagnosis present

## 2018-11-29 DIAGNOSIS — G4733 Obstructive sleep apnea (adult) (pediatric): Secondary | ICD-10-CM | POA: Diagnosis present

## 2018-11-29 DIAGNOSIS — Z7982 Long term (current) use of aspirin: Secondary | ICD-10-CM

## 2018-11-29 DIAGNOSIS — Z8547 Personal history of malignant neoplasm of testis: Secondary | ICD-10-CM

## 2018-11-29 DIAGNOSIS — K219 Gastro-esophageal reflux disease without esophagitis: Secondary | ICD-10-CM | POA: Diagnosis present

## 2018-11-29 DIAGNOSIS — F419 Anxiety disorder, unspecified: Secondary | ICD-10-CM | POA: Diagnosis present

## 2018-11-29 DIAGNOSIS — E1110 Type 2 diabetes mellitus with ketoacidosis without coma: Principal | ICD-10-CM | POA: Diagnosis present

## 2018-11-29 DIAGNOSIS — Z888 Allergy status to other drugs, medicaments and biological substances status: Secondary | ICD-10-CM

## 2018-11-29 DIAGNOSIS — E785 Hyperlipidemia, unspecified: Secondary | ICD-10-CM | POA: Diagnosis present

## 2018-11-29 DIAGNOSIS — Z79899 Other long term (current) drug therapy: Secondary | ICD-10-CM

## 2018-11-29 DIAGNOSIS — E1310 Other specified diabetes mellitus with ketoacidosis without coma: Secondary | ICD-10-CM

## 2018-11-29 DIAGNOSIS — E871 Hypo-osmolality and hyponatremia: Secondary | ICD-10-CM | POA: Diagnosis present

## 2018-11-29 DIAGNOSIS — Z794 Long term (current) use of insulin: Secondary | ICD-10-CM

## 2018-11-29 LAB — CBC
HCT: 45.4 % (ref 39.0–52.0)
HEMOGLOBIN: 15.5 g/dL (ref 13.0–17.0)
MCH: 33.1 pg (ref 26.0–34.0)
MCHC: 34.1 g/dL (ref 30.0–36.0)
MCV: 97 fL (ref 80.0–100.0)
Platelets: 198 10*3/uL (ref 150–400)
RBC: 4.68 MIL/uL (ref 4.22–5.81)
RDW: 12.7 % (ref 11.5–15.5)
WBC: 6.8 10*3/uL (ref 4.0–10.5)
nRBC: 0 % (ref 0.0–0.2)

## 2018-11-29 LAB — BLOOD GAS, VENOUS
ACID-BASE DEFICIT: 19.8 mmol/L — AB (ref 0.0–2.0)
Bicarbonate: 6.8 mmol/L — ABNORMAL LOW (ref 20.0–28.0)
O2 Saturation: 76.6 %
Patient temperature: 37
pCO2, Ven: 19 mmHg — CL (ref 44.0–60.0)
pH, Ven: 7.16 — CL (ref 7.250–7.430)
pO2, Ven: 54 mmHg — ABNORMAL HIGH (ref 32.0–45.0)

## 2018-11-29 LAB — INFLUENZA PANEL BY PCR (TYPE A & B)
Influenza A By PCR: NEGATIVE
Influenza B By PCR: NEGATIVE

## 2018-11-29 LAB — CK: Total CK: 76 U/L (ref 49–397)

## 2018-11-29 LAB — GLUCOSE, CAPILLARY
GLUCOSE-CAPILLARY: 124 mg/dL — AB (ref 70–99)
Glucose-Capillary: 220 mg/dL — ABNORMAL HIGH (ref 70–99)
Glucose-Capillary: 174 mg/dL — ABNORMAL HIGH (ref 70–99)
Glucose-Capillary: 150 mg/dL — ABNORMAL HIGH (ref 70–99)
Glucose-Capillary: 115 mg/dL — ABNORMAL HIGH (ref 70–99)

## 2018-11-29 LAB — MRSA PCR SCREENING: MRSA by PCR: NEGATIVE

## 2018-11-29 LAB — BASIC METABOLIC PANEL
Anion gap: 14 (ref 5–15)
BUN: 28 mg/dL — ABNORMAL HIGH (ref 6–20)
BUN: 21 mg/dL — ABNORMAL HIGH (ref 6–20)
CO2: <7 mmol/L — ABNORMAL LOW (ref 22–32)
CO2: 10 mmol/L — ABNORMAL LOW (ref 22–32)
Calcium: 8.7 mg/dL — ABNORMAL LOW (ref 8.9–10.3)
Calcium: 7.8 mg/dL — ABNORMAL LOW (ref 8.9–10.3)
Chloride: 102 mmol/L (ref 98–111)
Chloride: 108 mmol/L (ref 98–111)
Creatinine, Ser: 1.47 mg/dL — ABNORMAL HIGH (ref 0.61–1.24)
Creatinine, Ser: 1.07 mg/dL (ref 0.61–1.24)
GFR calc Af Amer: >60 mL/min (ref >60)
GFR calc Af Amer: >60 mL/min (ref >60)
GFR calc non Af Amer: 52 mL/min — ABNORMAL LOW (ref >60)
GFR calc non Af Amer: >60 mL/min (ref >60)
Glucose, Bld: 289 mg/dL — ABNORMAL HIGH (ref 70–99)
Glucose, Bld: 128 mg/dL — ABNORMAL HIGH (ref 70–99)
POTASSIUM: 5.1 mmol/L (ref 3.5–5.1)
Potassium: 3.4 mmol/L — ABNORMAL LOW (ref 3.5–5.1)
SODIUM: 132 mmol/L — AB (ref 135–145)
Sodium: 131 mmol/L — ABNORMAL LOW (ref 135–145)

## 2018-11-29 LAB — SALICYLATE LEVEL: Salicylate Lvl: <7 mg/dL (ref 2.8–30.0)

## 2018-11-29 LAB — POCT I-STAT, CHEM 8
BUN: 24 mg/dL — ABNORMAL HIGH (ref 6–20)
CREATININE: 0.9 mg/dL (ref 0.61–1.24)
Calcium, Ion: 1.11 mmol/L — ABNORMAL LOW (ref 1.15–1.40)
Chloride: 108 mmol/L (ref 98–111)
Glucose, Bld: 231 mg/dL — ABNORMAL HIGH (ref 70–99)
HCT: 45 % (ref 39.0–52.0)
Hemoglobin: 15.3 g/dL (ref 13.0–17.0)
Potassium: 3.7 mmol/L (ref 3.5–5.1)
Sodium: 133 mmol/L — ABNORMAL LOW (ref 135–145)
TCO2: 9 mmol/L — ABNORMAL LOW (ref 22–32)

## 2018-11-29 LAB — URINALYSIS, COMPLETE (UACMP) WITH MICROSCOPIC
Bacteria, UA: NONE SEEN
Bilirubin Urine: NEGATIVE
Glucose, UA: >=500 mg/dL — AB
Hgb urine dipstick: NEGATIVE
Ketones, ur: 80 mg/dL — AB
Leukocytes, UA: NEGATIVE
Nitrite: NEGATIVE
PH: 6 (ref 5.0–8.0)
Protein, ur: 30 mg/dL — AB
Specific Gravity, Urine: 1.025 (ref 1.005–1.030)

## 2018-11-29 LAB — TROPONIN I: Troponin I: <0.03 ng/mL

## 2018-11-29 LAB — CG4 I-STAT (LACTIC ACID): Lactic Acid, Venous: 1.15 mmol/L (ref 0.5–1.9)

## 2018-11-29 MED ORDER — CALCIUM CARBONATE ANTACID 500 MG PO CHEW
1.0000 | CHEWABLE_TABLET | Freq: Once | ORAL | Status: AC
  Administered 2018-11-29: 200 mg via ORAL
  Filled 2018-11-29: qty 1

## 2018-11-29 MED ORDER — SODIUM BICARBONATE 8.4 % IV SOLN
100.0000 meq | Freq: Once | INTRAVENOUS | Status: AC
  Administered 2018-11-29: 100 meq via INTRAVENOUS
  Filled 2018-11-29: qty 100

## 2018-11-29 MED ORDER — ATORVASTATIN CALCIUM 20 MG PO TABS
20.0000 mg | ORAL_TABLET | Freq: Every day | ORAL | Status: DC
  Administered 2018-11-29 – 2018-12-01 (×3): 20 mg via ORAL
  Filled 2018-11-29 (×3): qty 1

## 2018-11-29 MED ORDER — ASPIRIN EC 81 MG PO TBEC
81.0000 mg | DELAYED_RELEASE_TABLET | Freq: Every day | ORAL | Status: DC
  Administered 2018-11-29 – 2018-12-01 (×3): 81 mg via ORAL
  Filled 2018-11-29 (×3): qty 1

## 2018-11-29 MED ORDER — THIAMINE HCL 100 MG/ML IJ SOLN
100.0000 mg | Freq: Every day | INTRAMUSCULAR | Status: DC
  Administered 2018-11-30: 100 mg via INTRAVENOUS
  Filled 2018-11-29: qty 2

## 2018-11-29 MED ORDER — ASPIRIN EC 81 MG PO TBEC: 81.0000 mg | DELAYED_RELEASE_TABLET | Freq: Every day | ORAL | Status: DC

## 2018-11-29 MED ORDER — DEXTROSE 50 % IV SOLN: 25.0000 mL | INTRAVENOUS | Status: DC | PRN

## 2018-11-29 MED ORDER — SODIUM CHLORIDE 0.9 % IV BOLUS
1000.0000 mL | Freq: Once | INTRAVENOUS | Status: AC
  Administered 2018-11-29: 1000 mL via INTRAVENOUS

## 2018-11-29 MED ORDER — THIAMINE HCL 100 MG/ML IJ SOLN
100.0000 mg | INTRAMUSCULAR | Status: AC
  Administered 2018-11-29: 100 mg via INTRAVENOUS
  Filled 2018-11-29: qty 2

## 2018-11-29 MED ORDER — POTASSIUM CHLORIDE CRYS ER 20 MEQ PO TBCR
20.0000 meq | EXTENDED_RELEASE_TABLET | Freq: Once | ORAL | Status: AC
  Administered 2018-11-29: 20 meq via ORAL
  Filled 2018-11-29: qty 1

## 2018-11-29 MED ORDER — SODIUM CHLORIDE 0.9 % IV BOLUS
500.0000 mL | Freq: Once | INTRAVENOUS | Status: AC
  Administered 2018-11-29: 500 mL via INTRAVENOUS

## 2018-11-29 MED ORDER — ONDANSETRON HCL 4 MG/2ML IJ SOLN: 4.0000 mg | Freq: Four times a day (QID) | INTRAMUSCULAR | Status: DC | PRN

## 2018-11-29 MED ORDER — ALUM & MAG HYDROXIDE-SIMETH 200-200-20 MG/5ML PO SUSP
15.0000 mL | Freq: Once | ORAL | Status: AC
  Administered 2018-11-29: 15 mL via ORAL
  Filled 2018-11-29: qty 30

## 2018-11-29 MED ORDER — IOPAMIDOL (ISOVUE-300) INJECTION 61%
100.0000 mL | Freq: Once | INTRAVENOUS | Status: AC | PRN
  Administered 2018-11-29: 100 mL via INTRAVENOUS

## 2018-11-29 MED ORDER — ACETAMINOPHEN 650 MG RE SUPP: 650.0000 mg | Freq: Four times a day (QID) | RECTAL | Status: DC | PRN

## 2018-11-29 MED ORDER — ATORVASTATIN CALCIUM 20 MG PO TABS: 20.0000 mg | ORAL_TABLET | Freq: Every day | ORAL | Status: DC

## 2018-11-29 MED ORDER — INSULIN REGULAR BOLUS VIA INFUSION
0.0000 [IU] | Freq: Three times a day (TID) | INTRAVENOUS | Status: DC
  Filled 2018-11-29: qty 10

## 2018-11-29 MED ORDER — ONDANSETRON HCL 4 MG PO TABS: 4.0000 mg | ORAL_TABLET | Freq: Four times a day (QID) | ORAL | Status: DC | PRN

## 2018-11-29 MED ORDER — ADULT MULTIVITAMIN W/MINERALS CH
1.0000 | ORAL_TABLET | Freq: Every day | ORAL | Status: DC
  Administered 2018-11-30 – 2018-12-02 (×3): 1 via ORAL
  Filled 2018-11-29 (×3): qty 1

## 2018-11-29 MED ORDER — PANTOPRAZOLE SODIUM 40 MG PO TBEC
40.0000 mg | DELAYED_RELEASE_TABLET | Freq: Every day | ORAL | Status: DC
  Administered 2018-11-30 – 2018-12-02 (×3): 40 mg via ORAL
  Filled 2018-11-29 (×3): qty 1

## 2018-11-29 MED ORDER — SODIUM CHLORIDE 0.9 % IV SOLN: INTRAVENOUS | Status: DC

## 2018-11-29 MED ORDER — ESOMEPRAZOLE MAGNESIUM 20 MG PO TBEC: 20.0000 mg | DELAYED_RELEASE_TABLET | Freq: Every day | ORAL | Status: DC

## 2018-11-29 MED ORDER — ENOXAPARIN SODIUM 40 MG/0.4ML ~~LOC~~ SOLN
40.0000 mg | SUBCUTANEOUS | Status: DC
  Administered 2018-11-29 – 2018-12-01 (×3): 40 mg via SUBCUTANEOUS
  Filled 2018-11-29 (×3): qty 0.4

## 2018-11-29 MED ORDER — INSULIN REGULAR(HUMAN) IN NACL 100-0.9 UT/100ML-% IV SOLN
INTRAVENOUS | Status: DC
  Administered 2018-11-29: 1.6 [IU]/h via INTRAVENOUS
  Filled 2018-11-29: qty 100

## 2018-11-29 MED ORDER — ACETAMINOPHEN 325 MG PO TABS: 650.0000 mg | ORAL_TABLET | Freq: Four times a day (QID) | ORAL | Status: DC | PRN

## 2018-11-29 MED ORDER — DEXTROSE-NACL 5-0.45 % IV SOLN
INTRAVENOUS | Status: DC
  Administered 2018-11-29: 22:00:00 via INTRAVENOUS

## 2018-11-29 MED ORDER — ALPRAZOLAM 0.5 MG PO TABS
1.0000 mg | ORAL_TABLET | Freq: Four times a day (QID) | ORAL | Status: DC | PRN
  Administered 2018-11-29: 1 mg via ORAL
  Filled 2018-11-29 (×2): qty 1

## 2018-11-29 MED ORDER — IOPAMIDOL (ISOVUE-300) INJECTION 61%
30.0000 mL | Freq: Once | INTRAVENOUS | Status: AC | PRN
  Administered 2018-11-29: 30 mL via ORAL

## 2018-11-29 MED ORDER — AMLODIPINE BESYLATE 10 MG PO TABS
10.0000 mg | ORAL_TABLET | Freq: Every day | ORAL | Status: DC
  Administered 2018-11-30 – 2018-12-01 (×2): 10 mg via ORAL
  Filled 2018-11-29 (×2): qty 1

## 2018-11-29 NOTE — Progress Notes (Signed)
eLink Physician-Brief Progress Note Patient Name: NOLEN LINDAMOOD DOB: [birth year] MRN: [id number]   Date of Service  11/29/2018  HPI/Events of Note  58 yo male with PMH of DM. Presents with DKA. Now on insulin IV infusion. PH = 7.16. PCCM asked to assume care. VSS.   eICU Interventions  No new orders.      Intervention Category Evaluation Type: New Patient Evaluation  Lysle Dingwall 11/29/2018, 10:01 PM

## 2018-11-29 NOTE — ED Notes (Signed)
Unable to get blood from IV, patient wishes to wait until fluids have finished and attempt blood draw from IV again. Patient reports he does not want to be stuck again.

## 2018-11-29 NOTE — ED Notes (Signed)
Patient refusing second IV site.

## 2018-11-29 NOTE — ED Triage Notes (Signed)
Called lab and requested for them to come and draw labs.  Pt stuck x 3 at triage (2 by [place], 1 by Sherlene Shams).

## 2018-11-29 NOTE — ED Notes (Signed)
Lab at bedside

## 2018-11-29 NOTE — ED Provider Notes (Signed)
[facility] Emergency Department Provider Note   ____________________________________________   First MD Initiated Contact with Patient 11/29/18 1417     (approximate)  I have reviewed the triage vital signs and the nursing notes.   HISTORY  Chief Complaint Weakness    HPI Nathan Calderon is a 58 y.o. male reports he has been feeling well for about a month's time  1 month ago he reports he went to [facility] and was seen after he had a fall where he lost consciousness.  He was going to the bathroom and does not know if something made him fall or if he lost consciousness because he tripped and hit his head  He had a CAT scan done he reports he tolerated is okay, he was discharged home and for the next month or so he is basically not been feeling well, some nausea, intermittently feeling a little dizzy, feeling like he is having a little bit of "acid reflux" in his chest off and on, nothing seems to make it better or worse except Tums do help  No chest pain.  No fevers or chills.  No numbness or tingling.  Does report he just does not feel well, he is a previous testicular cancer patient he does report he is lost about 20 pounds of weight in the last month or so without trying.  Not eating too well, not drinking too well.     Past Medical History:  Diagnosis Date  . Angina    2006, had cardiac cath at the time  . Anxiety   . Diabetes mellitus    Type 2  . Headache(784.0)   . Heart murmur    as child  . Hyperlipidemia   . Hypertension   . Kidney stone    hx of  . Motion sickness    ocean boats  . Sleep apnea    no CPAP.  Never got it.  . Testicular cancer ([place]) 04/08/2016    Patient Active Problem List   Diagnosis Date Noted  . Encounter for colorectal cancer screening   . Benign neoplasm of ascending colon   . Shortness of breath 06/28/2016  . Nausea without vomiting 06/15/2016  . Thrombocytopenia ([place]) 06/02/2016  . Hyponatremia  06/02/2016  . Drug-induced neutropenia ([place]) 05/26/2016  . Anemia 05/17/2016  . Hypomagnesemia 05/17/2016  . 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 Testicular cancer ([place]) 04/08/2016  . 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 Vitreous hemorrhage ([place]) 04/04/2012  . Proliferative diabetic retinopathy(362.02) 04/04/2012    Past Surgical History:  Procedure Laterality Date  . CARDIAC CATHETERIZATION  2000   and 2006  . COLONOSCOPY WITH PROPOFOL N/A 05/12/2017   Procedure: COLONOSCOPY WITH PROPOFOL;  Surgeon: Lucilla Lame, MD;  Location: [facility];  Service: Endoscopy;  Laterality: N/A;  Cannot arrive before 8:45 Diabetic - insulin and oral meds sleep apnea  . EYE SURGERY     x 2  . HERNIA REPAIR     with undecended testicle  . LASIK    . ORCHIECTOMY Right 04/08/2016   Procedure: ORCHIECTOMY/ INGUINAL APPROACH;  Surgeon: [name], MD;  Location: [facility];  Service: Urology;  Laterality: Right;  . PARS PLANA VITRECTOMY  04/04/2012   Procedure: PARS PLANA VITRECTOMY WITH 25 GAUGE;  Surgeon: [name], MD;  Location: [place];  Service: Ophthalmology;  Laterality: Left;  with laser  . POLYPECTOMY  05/12/2017   Procedure: POLYPECTOMY;  Surgeon: Lucilla Lame, MD;  Location: [place];  Service: Endoscopy;;  . SHOULDER SURGERY     left shoulder  . VASECTOMY Left 04/08/2016   Procedure: VASECTOMY;  Surgeon: [name], MD;  Location: [facility];  Service: Urology;  Laterality: Left;    Prior to Admission medications   Medication Sig Start Date End Date Taking? Authorizing Provider  ALPRAZolam Duanne Moron) 1 MG tablet Take 1 mg by mouth every 6 (six) hours as needed for anxiety. for anxiety 04/18/17  Yes [provider]  amLODipine (NORVASC) 10 MG tablet Take 10 mg by mouth daily. 03/05/16  Yes [provider]  aspirin EC 81 MG tablet Take 81 mg by mouth daily.   Yes [provider]  atorvastatin (LIPITOR) 20 MG  tablet Take 20 mg by mouth daily.   Yes [provider]  empagliflozin (JARDIANCE) 25 MG TABS tablet Take 25 mg by mouth daily.   Yes [provider]  Esomeprazole Magnesium (NEXIUM 24HR) 20 MG TBEC Take 20 mg by mouth daily.   Yes [provider]  Insulin Glargine (TOUJEO SOLOSTAR) 300 UNIT/ML SOPN Inject 70 Units into the skin every evening.    Yes [provider]  losartan (COZAAR) 100 MG tablet Take 100 mg by mouth daily.   Yes [provider]  Multiple Vitamins-Minerals (MULTIVITAMIN ADULTS 50+) TABS Take 1 tablet by mouth daily.   Yes [provider]  hydrochlorothiazide (HYDRODIURIL) 50 MG tablet Take 50 mg by mouth daily.  01/24/16  [provider]    Allergies Patient has no known allergies.  Family History  Problem Relation Age of Onset  . Diabetes Mother   . Heart disease Mother   . Hypertension Mother   . Heart disease Father   . Ovarian cancer Maternal Grandmother   . Kidney disease Neg Hx   . Prostate cancer Neg Hx     Social History Social History   Tobacco Use  . Smoking status: Never Smoker  . Smokeless tobacco: Never Used  Substance Use Topics  . Alcohol use: Yes    Alcohol/week: 24.0 standard drinks    Types: 24 Cans of beer per week    Comment: 3-4 beers a day ( "not every day") 2- 3 times a week  . Drug use: No  Patient reports he is quit drinking now for several months  Review of Systems Constitutional: No fever/chills but feels very fatigued, generally just very weak. Eyes: No visual changes. ENT: No sore throat. Cardiovascular: Denies chest pain. Respiratory: Denies shortness of breath. Gastrointestinal: No abdominal pain.   Genitourinary: Negative for dysuria. Musculoskeletal: Negative for back pain. Skin: Negative for rash. Neurological: Negative for headaches, areas of focal weakness or numbness.  Reports he is able to walk, just feels little lightheaded little dizzy with walking  for the last several weeks      ____________________________________________   PHYSICAL EXAM:  VITAL SIGNS: ED Triage Vitals  Enc Vitals Group     BP 11/29/18 1222 (!) 112/58     Pulse Rate 11/29/18 1222 98     Resp 11/29/18 1222 17     Temp --      Temp src --      SpO2 11/29/18 1222 100 %     Weight 11/29/18 1226 220 lb (99.8 kg)     Height 11/29/18 1226 6\' 1"  (1.854 m)     Head Circumference --      Peak Flow --      Pain Score 11/29/18 1125 0  Pain Loc --      Pain Edu? --      Excl. in [place]? --     Constitutional: Alert and oriented. Well appearing and in no acute distress. Eyes: Conjunctivae are normal. Head: Atraumatic. Nose: No congestion/rhinnorhea. Mouth/Throat: Mucous membranes are lightly dry. Neck: No stridor.  Cardiovascular: Normal rate, regular rhythm. Grossly normal heart sounds.  Good peripheral circulation. Respiratory: Normal respiratory effort.  No retractions. Lungs CTAB. Gastrointestinal: Soft and nontender. No distention. Musculoskeletal: No lower extremity tenderness nor edema. Neurologic:  Normal speech and language. No gross focal neurologic deficits are appreciated.  Skin:  Skin is warm, dry and intact. No rash noted. Psychiatric: Mood and affect are normal. Speech and behavior are normal.  ____________________________________________   LABS (all labs ordered are listed, but only abnormal results are displayed)  Labs Reviewed  BASIC METABOLIC PANEL - Abnormal; Notable for the following components:      Result Value   Sodium 131 (*)    CO2 <7 (*)    Glucose, Bld 289 (*)    BUN 28 (*)    Creatinine, Ser 1.47 (*)    Calcium 8.7 (*)    GFR calc non Af Amer 52 (*)    All other components within normal limits  URINALYSIS, COMPLETE (UACMP) WITH MICROSCOPIC - Abnormal; Notable for the following components:   Color, Urine YELLOW (*)    APPearance CLEAR (*)    Glucose, UA >=500 (*)    Ketones, ur 80 (*)    Protein, ur 30 (*)    All  other components within normal limits  BLOOD GAS, VENOUS - Abnormal; Notable for the following components:   pH, Ven 7.16 (*)    pCO2, Ven 19 (*)    pO2, Ven 54.0 (*)    Bicarbonate 6.8 (*)    Acid-base deficit 19.8 (*)    All other components within normal limits  POCT I-STAT, CHEM 8 - Abnormal; Notable for the following components:   Sodium 133 (*)    BUN 24 (*)    Glucose, Bld 231 (*)    Calcium, Ion 1.11 (*)    TCO2 9 (*)    All other components within normal limits  CBC  TROPONIN I  CK  SALICYLATE LEVEL  CBG MONITORING, ED  I-STAT CG4 LACTIC ACID, ED  I-STAT CHEM 8, ED  CG4 I-STAT (LACTIC ACID)   ____________________________________________  EKG  Reviewed entered by me at 1355 Heart rate 89 cures 100 QTc 430 Normal sinus rhythm, nonspecific T wave abnormality seen in multiple leads, particularly notable in the left precordial leads. ____________________________________________  RADIOLOGY   CT the abdomen pelvis ordered due to the patient's unexpected weight loss and history of testicular cancer. Admitting MD will follow-up on results. ____________________________________________   PROCEDURES  Procedure(s) performed: None  Procedures  Critical Care performed: Yes, see critical care note(s)  CRITICAL CARE Performed by: Delman Kitten   Total critical care time: 37 minutes  Critical care time was exclusive of separately billable procedures and treating other patients.  Critical care was necessary to treat or prevent imminent or life-threatening deterioration.  Critical care was time spent personally by me on the following activities: development of treatment plan with patient and/or surrogate as well as nursing, discussions with consultants, evaluation of patient's response to treatment, examination of patient, obtaining history from patient or surrogate, ordering and performing treatments and interventions, ordering and review of laboratory studies, ordering  and review of radiographic studies, pulse oximetry and  re-evaluation of patient's condition.  ____________________________________________   INITIAL IMPRESSION / ASSESSMENT AND PLAN / ED COURSE  Pertinent labs & imaging results that were available during my care of the patient were reviewed by me and considered in my medical decision making (see chart for details).   Patient presents for symptoms of fatigue, weakness, intermittently just not feeling well occasional heartburn and also weight loss that is evidently been ongoing for about a month now.  Does have a history of testicular cancer.  He appears well on examination but likely slightly dehydrated.  Will initiate fluids for hydration, check a repeat CT given his associated head injury a month ago with ongoing symptoms assure no subdural is noted.  No signs or symptoms of stroke or focal neurologic symptoms  His lab work is done his CBC is normal, but interestingly his BMP shows a bicarbonate less than 7.  Somewhat hard to explain this.  Patient reports he has been compliant with his diabetes medications, his blood sugars normally in the 200s, but he does look volume depleted.  He also had difficulty obtaining labs and thus will repeat an i-STAT chemistry to further evaluate as I question the accuracy of this particular lab value given the difficulty obtaining it.  Also check VBG, lactic acid CK and a salicylate.  Patient reports he does not take aspirin regularly, has not had a aspirin for a couple of weeks  Could be some type of strange unaccounted for acid, or potentially what I would consider possible laboratory abnormality.  ----------------------------------------- 5:56 PM on 11/29/2018 -----------------------------------------  Lab work is returned including urinalysis demonstrates ketones, now in light of current of evaluation suspect the patient is in DKA.  Discussed with Dr. Bobbye Charleston of the hospitalist service if this is somewhat  subtle appearance of DKA likely related to his current insulin regimen.  Will initiate the patient at this time on IV insulin infusion, Dr. Bobbye Charleston advises against starting dextrose containing fluid at this time but instead to continue to watch the patient's blood sugars closely and will see and evaluate for admission soon.  Patient agreeable with plan for admission.      ____________________________________________   FINAL CLINICAL IMPRESSION(S) / ED DIAGNOSES  Final diagnoses:  Diabetic ketoacidosis without coma associated with other specified diabetes mellitus ([place])        Note:  This document was prepared using Dragon voice recognition software and may include unintentional dictation errors       Delman Kitten, MD 11/29/18 2119

## 2018-11-29 NOTE — ED Notes (Signed)
Patient transported to X-ray 

## 2018-11-29 NOTE — Consult Note (Addendum)
Name: Nathan Calderon MRN: [id number] DOB: 1961-07-23    ADMISSION DATE:  11/29/2018 CONSULTATION DATE: 11/29/2018  REFERRING MD : Dr. Leslye Peer   CHIEF COMPLAINT: Weakness   BRIEF PATIENT DESCRIPTION:  58 yo male admitted with DKA requiring insulin gtt   SIGNIFICANT EVENTS  01/1-Pt admitted to the stepdown unit   STUDIES:  CT Head 01/1>>Negative head CT CT Abd Pelvis 01/1>>Wall thickening involving the ascending colon and proximal transverse colon, possibly indicating colitis, but the wall thickening may just be related to the fact that these segments are decompressed. Does the patient have diarrhea or other symptoms that might make 1 suspect colitis? Mild diffuse hepatic steatosis without focal hepatic parenchymal abnormality. Non- obstructing LEFT LOWER pole renal calculi, the largest measuring approximately 6 mm. Very small hiatal hernia. Mild thickening of the wall of the distal esophagus may indicate GE reflux disease. Scattered descending and sigmoid colon diverticula without evidence of acute diverticulitis. High-riding LEFT testicle located in the inguinal canal. Small LEFT inguinal hernia containing fat. Small amount of fluid in the LEFT inguinal canal. Prior RIGHT orchiectomy. Aortic Atherosclerosis  HISTORY OF PRESENT ILLNESS:   This is a 58 yo male with a PMH of Testicular Cancer, OSA (does not wear CPAP), Kidney Stone, HTN, Hyperlipidemia, Heart Murmur, Type II Diabetes Mellitus, Anxiety, and Angina.  He presented to [facility] ER on 01/1 with weakness and worsening fatigue following two falls in November 2019.  During the fall he hit his head, per ER notes the pt was evaluated in the emergency room post fall and diagnosed with a concussion.  The pt also reported an unintentional weight loss of 28 lbs over the past few weeks, and his girlfriend stated the pt has had problems with stumbling during ambulation.  He states he has had a decreased appetite secondary to nausea.  During current ER  visit triage nurse noticed pt had some slurred speech with c/o headache. Lab results revealed Na 131, K+ 5.1, CO2 <7, anion gap glucose 289, BUN 28, creatinine 1.47, troponin <0.03, and vbg pH 7.16/pCO2 19/bicarb 6.8.  Lab results ruled pt in for DKA insulin gtt initiated and pt received 2.5L NS bolus. The pt does take toujeo and jardiance to manage Type II diabetes mellitus in the outpatient setting, however he ran out of toujeo 1 week ago. He also states he has not had health insurance since May 2019. CT Head negative and CT Abd Pelvis concerning for possible colitis and non-obstructing left lower pole renal calculi. Pt denies diarrhea, fever, or chills. He was subsequently admitted to the stepdown unit by hospitalist team for additional workup and treatment.    PAST MEDICAL HISTORY :   has a past medical history of Angina, Anxiety, Diabetes mellitus, Headache(784.0), Heart murmur, Hyperlipidemia, Hypertension, Kidney stone, Motion sickness, Sleep apnea, and Testicular cancer ([name]) (04/08/2016).  has a past surgical history that includes Eye surgery; LASIK; Hernia repair; Shoulder surgery; Pars plana vitrectomy (04/04/2012); Orchiectomy (Right, 04/08/2016); Vasectomy (Left, 04/08/2016); Cardiac catheterization (2000); Colonoscopy with propofol (N/A, 05/12/2017); and polypectomy (05/12/2017). Prior to Admission medications   Medication Sig Start Date End Date Taking? Authorizing Provider  ALPRAZolam Duanne Moron) 1 MG tablet Take 1 mg by mouth every 6 (six) hours as needed for anxiety. for anxiety 04/18/17  Yes [provider]  amLODipine (NORVASC) 10 MG tablet Take 10 mg by mouth daily. 03/05/16  Yes [provider]  aspirin EC 81 MG tablet Take 81 mg by mouth daily.   Yes [provider]  atorvastatin (LIPITOR) 20 MG tablet Take 20 mg by mouth daily.   Yes [provider]  empagliflozin (JARDIANCE) 25 MG TABS tablet Take 25 mg by mouth daily.   Yes [provider]    Esomeprazole Magnesium (NEXIUM 24HR) 20 MG TBEC Take 20 mg by mouth daily.   Yes [provider]  Insulin Glargine (TOUJEO SOLOSTAR) 300 UNIT/ML SOPN Inject 70 Units into the skin every evening.    Yes [provider]  losartan (COZAAR) 100 MG tablet Take 100 mg by mouth daily.   Yes [provider]  Multiple Vitamins-Minerals (MULTIVITAMIN ADULTS 50+) TABS Take 1 tablet by mouth daily.   Yes [provider]  hydrochlorothiazide (HYDRODIURIL) 50 MG tablet Take 50 mg by mouth daily.  01/24/16  [provider]   No Known Allergies  FAMILY HISTORY:  family history includes Diabetes in his mother; Heart disease in his father and mother; Hypertension in his mother; Ovarian cancer in his maternal grandmother. SOCIAL HISTORY:  reports that he has never smoked. He has never used smokeless tobacco. He reports current alcohol use of about 24.0 standard drinks of alcohol per week. He reports that he does not use drugs.  REVIEW OF SYSTEMS: Positives in BOLD   Constitutional: fever, chills, weight loss, malaise/fatigue and diaphoresis.  HENT: Negative for hearing loss, ear pain, nosebleeds, congestion, sore throat, neck pain, tinnitus and ear discharge.   Eyes: Negative for blurred vision, double vision, photophobia, pain, discharge and redness.  Respiratory: cough, hemoptysis, sputum production, shortness of breath, wheezing and stridor.   Cardiovascular: Negative for chest pain, palpitations, orthopnea, claudication, leg swelling and PND.  Gastrointestinal: heartburn, nausea, vomiting, abdominal pain, diarrhea, constipation, blood in stool and melena.  Genitourinary: Negative for dysuria, urgency, frequency, hematuria and flank pain.  Musculoskeletal: myalgias, back pain, joint pain and falls.  Skin: Negative for itching and rash.  Neurological: Negative for dizziness, tingling, tremors, sensory change, speech change, focal weakness, seizures, loss of  consciousness, weakness and headaches.  Endo/Heme/Allergies: environmental allergies and polydipsia. Does not bruise/bleed easily.  SUBJECTIVE:  c/o thirst   VITAL SIGNS: Pulse Rate:  [86-98] 89 (01/01 1830) Resp:  [16-20] 20 (01/01 1830) BP: (112-134)/(58-69) 134/69 (01/01 1830) SpO2:  [98 %-100 %] 98 % (01/01 1830) Weight:  [99.8 kg] 99.8 kg (01/01 1226)  PHYSICAL EXAMINATION: General: well developed, well nourished, resting in bed NAD  Neuro: alert and oriented, follows commands  HEENT: supple, no JVD  Cardiovascular: nsr, rrr, no R/G  Lungs: clear throughout, even, non labored  Abdomen: +BS x4, obese, soft, non tender, non distended  Musculoskeletal: normal bulk and tone, no edema  Skin: intact no rashes or lesions   Recent Labs  Lab 11/29/18 1410 11/29/18 1740  NA 131* 133*  K 5.1 3.7  CL 102 108  CO2 <7*  --   BUN 28* 24*  CREATININE 1.47* 0.90  GLUCOSE 289* 231*   Recent Labs  Lab 11/29/18 1410 11/29/18 1740  HGB 15.5 15.3  HCT 45.4 45.0  WBC 6.8  --   PLT 198  --    Dg Chest 2 View  Result Date: 11/29/2018 CLINICAL DATA:  Generalized weakness. 28 pound weight loss over 1 week. EXAM: CHEST - 2 VIEW COMPARISON:  July 13, 2017 FINDINGS: The heart size and mediastinal contours are within normal limits. Both lungs are clear. The visualized skeletal structures are stable. IMPRESSION: No active cardiopulmonary disease. Electronically Signed   By: Abelardo Diesel M.D.   On: 11/29/2018  14:52   Ct Head Wo Contrast  Result Date: 11/29/2018 CLINICAL DATA:  Head trauma.  Ataxia. EXAM: CT HEAD WITHOUT CONTRAST TECHNIQUE: Contiguous axial images were obtained from the base of the skull through the vertex without intravenous contrast. COMPARISON:  Head CT 10/17/2018 FINDINGS: Brain: No evidence for acute hemorrhage, mass lesion, midline shift, hydrocephalus or large infarct. Vascular: No hyperdense vessel or unexpected calcification. Skull: Normal. Negative for fracture or  focal lesion. Sinuses/Orbits: No acute finding. Other: None. IMPRESSION: Negative head CT. Electronically Signed   By: Markus Daft M.D.   On: 11/29/2018 13:49   Ct Abdomen Pelvis W Contrast  Result Date: 11/29/2018 CLINICAL DATA:  58 year old who states that he has lost over 25 pounds in the past few weeks. One month history of progressively increasing generalized weakness. Personal history of testicular cancer. EXAM: CT ABDOMEN AND PELVIS WITH CONTRAST TECHNIQUE: Multidetector CT imaging of the abdomen and pelvis was performed using the standard protocol following bolus administration of intravenous contrast. CONTRAST:  148mL ISOVUE-300 IOPAMIDOL INJECTION 61% IV. Oral contrast was also administered. COMPARISON:  09/28/2017, 09/28/2016 and earlier. FINDINGS: Lower chest: Heart size normal. Mild LEFT circumflex artery atherosclerosis. Visualized lung bases clear. Hepatobiliary: Mild diffuse hepatic steatosis without focal hepatic parenchymal abnormality. Gallbladder normal in appearance without calcified gallstones. No biliary ductal dilation. Pancreas: Atrophy involving the pancreatic head with fatty replacement. No pancreatic mass or peripancreatic inflammation. Spleen: Normal in size and appearance. Adrenals/Urinary Tract: Normal appearing adrenal glands. Non-obstructing LEFT LOWER pole renal calculi, the largest measuring approximately 6 mm. No ureteral calculi elsewhere on either side. No hydronephrosis. No focal parenchymal abnormality involving either kidney. Normal appearing urinary bladder. Stomach/Bowel: Very small hiatal hernia, more conspicuous on today's examination. Stomach otherwise normal in appearance. Mild thickening of the wall of the distal esophagus. Normal-appearing small bowel. Ascending colon and proximal transverse colon demonstrating wall thickening, though the segments are decompressed. Moderate stool burden involving the cecum and the distal transverse colon with expected stool burden  elsewhere. Scattered descending and sigmoid colon diverticula without evidence of acute diverticulitis. Normal appendix in the RIGHT UPPER pelvis. Vascular/Lymphatic: Moderate aorto-iliofemoral atherosclerosis without evidence of aneurysm. Normal-appearing portal venous and systemic venous systems. No pathologic lymphadenopathy. Reproductive: Prior RIGHT orchiectomy. High-riding LEFT testicle in the inguinal canal. Small LEFT inguinal hernia containing fat. Small amount of fluid in the LEFT inguinal canal. Thickening of the skin of the scrotum anteriorly is related to post surgical scar as it is unchanged over multiple prior examinations. Other: None. Musculoskeletal: Spondylosis involving the visualized LOWER thoracic spine. Mild degenerative disc disease at L5-S1. No acute findings. IMPRESSION: 1. Wall thickening involving the ascending colon and proximal transverse colon, possibly indicating colitis, but the wall thickening may just be related to the fact that these segments are decompressed. Does the patient have diarrhea or other symptoms that might make 1 suspect colitis? 2. Mild diffuse hepatic steatosis without focal hepatic parenchymal abnormality. 3. Non-obstructing LEFT LOWER pole renal calculi, the largest measuring approximately 6 mm. 4. Very small hiatal hernia. Mild thickening of the wall of the distal esophagus may indicate GE reflux disease. 5. Scattered descending and sigmoid colon diverticula without evidence of acute diverticulitis. 6. High-riding LEFT testicle located in the inguinal canal. Small LEFT inguinal hernia containing fat. Small amount of fluid in the LEFT inguinal canal. 7. Prior RIGHT orchiectomy. Aortic Atherosclerosis (ICD10-170.0) Electronically Signed   By: Evangeline Dakin M.D.   On: 11/29/2018 18:17    ASSESSMENT / PLAN:  Diabetic Ketoacidosis (pt  does take toujeo and jardiance in outpatient setting) Continue insulin gtt until anion gap closed and serum CO2 >20 BMP q4hrs  and CBG's q1hr while on insulin gtt Hemoglobin A1c pending  Diabetes coordinator consulted appreciate input  Hypertension Continue outpatient aspirin, atorvastatin, and amlodipine  Continuous telemetry monitoring   Hyponatremia secondary to DKA  Acute renal failure secondary to dehydration  Continue iv fluids per DKA protocol Trend BMP  Replace electrolytes as indicated  Monitor UOP  Avoid nephrotoxic medications   GERD Continue protonix   ETOH Use Anxiety  Continue thiamine, mvi, and folic acid  Prn xanax for anxiety   VTE px: subq lovenox  Marda Stalker, [place] Pager [phone] (please enter 7 digits) PCCM Consult Pager [phone] (please enter 7 digits)

## 2018-11-29 NOTE — H&P (Signed)
[facility] NAME: Nathan Calderon    MR#:  [id number]  DATE OF BIRTH:  [birth year]  DATE OF ADMISSION:  11/29/2018  PRIMARY CARE PHYSICIAN: Philmore Pali, NP   REQUESTING/REFERRING PHYSICIAN: Dr Delman Kitten  CHIEF COMPLAINT:   Chief Complaint  Patient presents with  . Weakness    HISTORY OF PRESENT ILLNESS:  Nathan Calderon  is a 58 y.o. male with a known history of of diabetes presents with no energy.  Unsteady when he is walking.  Really wobbly on his feet.  Complains of some headache.  He states back in November he had a fall and he hit his head and had been diagnosed with a concussion.  He has been really nauseated since Christmas.  Just not feeling well.  He came to the ER and was found to be very acidotic.  Patient does take Jardiance and Toujeo at home.  Hospitalist services were contacted for further evaluation.  PAST MEDICAL HISTORY:   Past Medical History:  Diagnosis Date  . Angina    2006, had cardiac cath at the time  . Anxiety   . Diabetes mellitus    Type 2  . Headache(784.0)   . Heart murmur    as child  . Hyperlipidemia   . Hypertension   . Kidney stone    hx of  . Motion sickness    ocean boats  . Sleep apnea    no CPAP.  Never got it.  . Testicular cancer ([place]) 04/08/2016    PAST SURGICAL HISTORY:   Past Surgical History:  Procedure Laterality Date  . CARDIAC CATHETERIZATION  2000   and 2006  . COLONOSCOPY WITH PROPOFOL N/A 05/12/2017   Procedure: COLONOSCOPY WITH PROPOFOL;  Surgeon: Lucilla Lame, MD;  Location: [facility];  Service: Endoscopy;  Laterality: N/A;  Cannot arrive before 8:45 Diabetic - insulin and oral meds sleep apnea  . EYE SURGERY     x 2  . HERNIA REPAIR     with undecended testicle  . LASIK    . ORCHIECTOMY Right 04/08/2016   Procedure: ORCHIECTOMY/ INGUINAL APPROACH;  Surgeon: [name], MD;  Location: [facility];  Service: Urology;  Laterality: Right;  . PARS  PLANA VITRECTOMY  04/04/2012   Procedure: PARS PLANA VITRECTOMY WITH 25 GAUGE;  Surgeon: [name], MD;  Location: [facility];  Service: Ophthalmology;  Laterality: Left;  with laser  . POLYPECTOMY  05/12/2017   Procedure: POLYPECTOMY;  Surgeon: Lucilla Lame, MD;  Location: [facility];  Service: Endoscopy;;  . SHOULDER SURGERY     left shoulder  . VASECTOMY Left 04/08/2016   Procedure: VASECTOMY;  Surgeon: [name], MD;  Location: [facility];  Service: Urology;  Laterality: Left;    SOCIAL HISTORY:   Social History   Tobacco Use  . Smoking status: Never Smoker  . Smokeless tobacco: Never Used  Substance Use Topics  . Alcohol use: Yes    Alcohol/week: 24.0 standard drinks    Types: 24 Cans of beer per week    Comment: 3-4 beers a day ( "not every day") 2- 3 times a week    FAMILY HISTORY:   Family History  Problem Relation Age of Onset  . Diabetes Mother   . Heart disease Mother   . Hypertension Mother   . Heart disease Father   . Ovarian cancer Maternal Grandmother   . Kidney disease Neg Hx   . Prostate cancer  Neg Hx     DRUG ALLERGIES:  No Known Allergies  REVIEW OF SYSTEMS:  CONSTITUTIONAL: No fever.positive for chills.  Positive for fatigue and weakness.  Positive for 28 pound weight loss in 10 days. EYES: Wears glasses and has blurry vision of the left eye. EARS, NOSE, AND THROAT: No tinnitus or ear pain. No sore throat RESPIRATORY: No cough.  Some shortness of breath with exertion.  No wheezing or hemoptysis.  CARDIOVASCULAR: No chest pain, orthopnea, edema.  GASTROINTESTINAL: Positive for nausea.  no vomiting, diarrhea or abdominal pain. No blood in bowel movements.  Positive constipation but had a bowel movement last night.  Positive for GERD GENITOURINARY: No dysuria, hematuria.  ENDOCRINE: No polyuria, nocturia,  HEMATOLOGY: No anemia, easy bruising or bleeding SKIN: No rash or lesion. MUSCULOSKELETAL: No joint pain or arthritis.   NEUROLOGIC: No  tingling, numbness, weakness.  PSYCHIATRY: No anxiety or depression.   MEDICATIONS AT HOME:   Prior to Admission medications   Medication Sig Start Date End Date Taking? Authorizing Provider  ALPRAZolam Duanne Moron) 1 MG tablet Take 1 mg by mouth every 6 (six) hours as needed for anxiety. for anxiety 04/18/17  Yes [provider]  amLODipine (NORVASC) 10 MG tablet Take 10 mg by mouth daily. 03/05/16  Yes [provider]  aspirin EC 81 MG tablet Take 81 mg by mouth daily.   Yes [provider]  atorvastatin (LIPITOR) 20 MG tablet Take 20 mg by mouth daily.   Yes [provider]  empagliflozin (JARDIANCE) 25 MG TABS tablet Take 25 mg by mouth daily.   Yes [provider]  Esomeprazole Magnesium (NEXIUM 24HR) 20 MG TBEC Take 20 mg by mouth daily.   Yes [provider]  Insulin Glargine (TOUJEO SOLOSTAR) 300 UNIT/ML SOPN Inject 70 Units into the skin every evening.    Yes [provider]  losartan (COZAAR) 100 MG tablet Take 100 mg by mouth daily.   Yes [provider]  Multiple Vitamins-Minerals (MULTIVITAMIN ADULTS 50+) TABS Take 1 tablet by mouth daily.   Yes [provider]  hydrochlorothiazide (HYDRODIURIL) 50 MG tablet Take 50 mg by mouth daily.  01/24/16  [provider]      VITAL SIGNS:  Blood pressure 134/68, pulse 86, resp. rate 16, height 6\' 1"  (1.854 m), weight 99.8 kg, SpO2 100 %.  PHYSICAL EXAMINATION:  GENERAL:  59 y.o.-year-old patient lying in the bed with no acute distress.  EYES: Pupils equal, round, reactive to light and accommodation. No scleral icterus. Extraocular muscles intact.  HEENT: Head atraumatic, normocephalic. Oropharynx and nasopharynx clear.  NECK:  Supple, no jugular venous distention. No thyroid enlargement, no tenderness.  LUNGS: Normal breath sounds bilaterally, no wheezing, rales,rhonchi or crepitation. No use of accessory muscles of respiration.  CARDIOVASCULAR: S1, S2  normal. No murmurs, rubs, or gallops.  ABDOMEN: Soft, nontender, nondistended. Bowel sounds present. No organomegaly or mass.  EXTREMITIES: No pedal edema, cyanosis, or clubbing.  NEUROLOGIC: Cranial nerves II through XII are intact. Muscle strength 5/5 in all extremities. Sensation intact. Gait not checked.  PSYCHIATRIC: The patient is alert and oriented x 3.  SKIN: No rash, lesion, or ulcer.   LABORATORY PANEL:   CBC Recent Labs  Lab 11/29/18 1410 11/29/18 1740  WBC 6.8  --   HGB 15.5 15.3  HCT 45.4 45.0  PLT 198  --    ------------------------------------------------------------------------------------------------------------------  Chemistries  Recent Labs  Lab 11/29/18 1410 11/29/18 1740  NA 131* 133*  K 5.1  3.7  CL 102 108  CO2 <7*  --   GLUCOSE 289* 231*  BUN 28* 24*  CREATININE 1.47* 0.90  CALCIUM 8.7*  --    ------------------------------------------------------------------------------------------------------------------  Cardiac Enzymes Recent Labs  Lab 11/29/18 1410  TROPONINI <0.03   ------------------------------------------------------------------------------------------------------------------  RADIOLOGY:  Dg Chest 2 View  Result Date: 11/29/2018 CLINICAL DATA:  Generalized weakness. 28 pound weight loss over 1 week. EXAM: CHEST - 2 VIEW COMPARISON:  July 13, 2017 FINDINGS: The heart size and mediastinal contours are within normal limits. Both lungs are clear. The visualized skeletal structures are stable. IMPRESSION: No active cardiopulmonary disease. Electronically Signed   By: Abelardo Diesel M.D.   On: 11/29/2018 14:52   Ct Head Wo Contrast  Result Date: 11/29/2018 CLINICAL DATA:  Head trauma.  Ataxia. EXAM: CT HEAD WITHOUT CONTRAST TECHNIQUE: Contiguous axial images were obtained from the base of the skull through the vertex without intravenous contrast. COMPARISON:  Head CT 10/17/2018 FINDINGS: Brain: No evidence for acute hemorrhage, mass  lesion, midline shift, hydrocephalus or large infarct. Vascular: No hyperdense vessel or unexpected calcification. Skull: Normal. Negative for fracture or focal lesion. Sinuses/Orbits: No acute finding. Other: None. IMPRESSION: Negative head CT. Electronically Signed   By: Markus Daft M.D.   On: 11/29/2018 13:49   Ct Abdomen Pelvis W Contrast  Result Date: 11/29/2018 CLINICAL DATA:  58 year old who states that he has lost over 25 pounds in the past few weeks. One month history of progressively increasing generalized weakness. Personal history of testicular cancer. EXAM: CT ABDOMEN AND PELVIS WITH CONTRAST TECHNIQUE: Multidetector CT imaging of the abdomen and pelvis was performed using the standard protocol following bolus administration of intravenous contrast. CONTRAST:  111mL ISOVUE-300 IOPAMIDOL INJECTION 61% IV. Oral contrast was also administered. COMPARISON:  09/28/2017, 09/28/2016 and earlier. FINDINGS: Lower chest: Heart size normal. Mild LEFT circumflex artery atherosclerosis. Visualized lung bases clear. Hepatobiliary: Mild diffuse hepatic steatosis without focal hepatic parenchymal abnormality. Gallbladder normal in appearance without calcified gallstones. No biliary ductal dilation. Pancreas: Atrophy involving the pancreatic head with fatty replacement. No pancreatic mass or peripancreatic inflammation. Spleen: Normal in size and appearance. Adrenals/Urinary Tract: Normal appearing adrenal glands. Non-obstructing LEFT LOWER pole renal calculi, the largest measuring approximately 6 mm. No ureteral calculi elsewhere on either side. No hydronephrosis. No focal parenchymal abnormality involving either kidney. Normal appearing urinary bladder. Stomach/Bowel: Very small hiatal hernia, more conspicuous on today's examination. Stomach otherwise normal in appearance. Mild thickening of the wall of the distal esophagus. Normal-appearing small bowel. Ascending colon and proximal transverse colon demonstrating  wall thickening, though the segments are decompressed. Moderate stool burden involving the cecum and the distal transverse colon with expected stool burden elsewhere. Scattered descending and sigmoid colon diverticula without evidence of acute diverticulitis. Normal appendix in the RIGHT UPPER pelvis. Vascular/Lymphatic: Moderate aorto-iliofemoral atherosclerosis without evidence of aneurysm. Normal-appearing portal venous and systemic venous systems. No pathologic lymphadenopathy. Reproductive: Prior RIGHT orchiectomy. High-riding LEFT testicle in the inguinal canal. Small LEFT inguinal hernia containing fat. Small amount of fluid in the LEFT inguinal canal. Thickening of the skin of the scrotum anteriorly is related to post surgical scar as it is unchanged over multiple prior examinations. Other: None. Musculoskeletal: Spondylosis involving the visualized LOWER thoracic spine. Mild degenerative disc disease at L5-S1. No acute findings. IMPRESSION: 1. Wall thickening involving the ascending colon and proximal transverse colon, possibly indicating colitis, but the wall thickening may just be related to the fact that these segments are decompressed. Does the patient have  diarrhea or other symptoms that might make 1 suspect colitis? 2. Mild diffuse hepatic steatosis without focal hepatic parenchymal abnormality. 3. Non-obstructing LEFT LOWER pole renal calculi, the largest measuring approximately 6 mm. 4. Very small hiatal hernia. Mild thickening of the wall of the distal esophagus may indicate GE reflux disease. 5. Scattered descending and sigmoid colon diverticula without evidence of acute diverticulitis. 6. High-riding LEFT testicle located in the inguinal canal. Small LEFT inguinal hernia containing fat. Small amount of fluid in the LEFT inguinal canal. 7. Prior RIGHT orchiectomy. Aortic Atherosclerosis (ICD10-170.0) Electronically Signed   By: Evangeline Dakin M.D.   On: 11/29/2018 18:17    EKG:   Sinus  rhythm 83 bpm, APC  IMPRESSION AND PLAN:   1.  Diabetic ketoacidosis.  Patient is very acidotic.  Patients that are on these newer agents such as Jardiance may not present with the high sugars.  Insulin drip ordered.  IV fluid bolus.  Hemoglobin A1c added onto labs drawn in the ER. 2.  Hypertension.  Blood pressure on the lower side for him we will hold his antihypertensives at this time. 3.  Hyperlipidemia unspecified continue statin 4.  Anxiety.  Xanax 5.  Hyponatremia secondary to diabetic ketoacidosis. 6.  History of alcohol overuse.  I will give IV thiamine before we start the D5.  Patient states he has not drank anything in 2 weeks. 7.  GERD on PPI  All the records are reviewed and case discussed with ED provider. Management plans discussed with the patient, family and they are in agreement.  CODE STATUS: Full code  TOTAL TIME TAKING CARE OF THIS PATIENT: 55 minutes, patient will be admitted to the CCU stepdown for insulin drip.   Loletha Grayer M.D on 11/29/2018 at 6:22 PM  Between 7am to 6pm - Pager - [phone]  After 6pm call admission pager [phone]  [facility]  [phone]  CC: Primary care physician; Philmore Pali, NP

## 2018-11-29 NOTE — ED Notes (Signed)
ED Provider at bedside. 

## 2018-11-29 NOTE — ED Notes (Signed)
Pt has lost 28 lbs over last week per his report. Fell X 2 week before thanksgiving. Over last month pt has had increasing weakness.  Girlfriend reports over last week she reports that she has noticed that he is stumbling when he gets up and seems off balance.  Speech seems slurred in triage.  No blood thinners.  C/o headache in triage.

## 2018-11-29 NOTE — ED Triage Notes (Signed)
Pt to ed with c/o fall in November, reports he was seen at that time.  Since then gradual decline in strength and energy.  Pt states any activity results in exhaustion.  Pt alert and oriented.  Denies headache at this time. Denies chest pain.

## 2018-11-30 DIAGNOSIS — E1110 Type 2 diabetes mellitus with ketoacidosis without coma: Principal | ICD-10-CM

## 2018-11-30 LAB — BASIC METABOLIC PANEL
Anion gap: 15 (ref 5–15)
Anion gap: 13 (ref 5–15)
BUN: 19 mg/dL (ref 6–20)
BUN: 14 mg/dL (ref 6–20)
CO2: 11 mmol/L — ABNORMAL LOW (ref 22–32)
CO2: 15 mmol/L — ABNORMAL LOW (ref 22–32)
Calcium: 7.9 mg/dL — ABNORMAL LOW (ref 8.9–10.3)
Calcium: 7.4 mg/dL — ABNORMAL LOW (ref 8.9–10.3)
Chloride: 110 mmol/L (ref 98–111)
Chloride: 112 mmol/L — ABNORMAL HIGH (ref 98–111)
Creatinine, Ser: 0.97 mg/dL (ref 0.61–1.24)
Creatinine, Ser: 0.91 mg/dL (ref 0.61–1.24)
GFR calc Af Amer: >60 mL/min (ref >60)
GFR calc non Af Amer: >60 mL/min (ref >60)
Glucose, Bld: 135 mg/dL — ABNORMAL HIGH (ref 70–99)
Glucose, Bld: 134 mg/dL — ABNORMAL HIGH (ref 70–99)
Potassium: 3.4 mmol/L — ABNORMAL LOW (ref 3.5–5.1)
Potassium: 3.3 mmol/L — ABNORMAL LOW (ref 3.5–5.1)
Sodium: 136 mmol/L (ref 135–145)
Sodium: 140 mmol/L (ref 135–145)

## 2018-11-30 LAB — BLOOD GAS, ARTERIAL
Acid-base deficit: 14.3 mmol/L — ABNORMAL HIGH (ref 0.0–2.0)
Bicarbonate: 9.8 mmol/L — ABNORMAL LOW (ref 20.0–28.0)
FIO2: 0.21
O2 Saturation: 98 %
PCO2 ART: 20 mmHg — AB (ref 32.0–48.0)
Patient temperature: 37
pH, Arterial: 7.3 — ABNORMAL LOW (ref 7.350–7.450)
pO2, Arterial: 115 mmHg — ABNORMAL HIGH (ref 83.0–108.0)

## 2018-11-30 LAB — GLUCOSE, CAPILLARY
GLUCOSE-CAPILLARY: 151 mg/dL — AB (ref 70–99)
Glucose-Capillary: 113 mg/dL — ABNORMAL HIGH (ref 70–99)
Glucose-Capillary: 118 mg/dL — ABNORMAL HIGH (ref 70–99)
Glucose-Capillary: 126 mg/dL — ABNORMAL HIGH (ref 70–99)
Glucose-Capillary: 129 mg/dL — ABNORMAL HIGH (ref 70–99)
Glucose-Capillary: 131 mg/dL — ABNORMAL HIGH (ref 70–99)
Glucose-Capillary: 135 mg/dL — ABNORMAL HIGH (ref 70–99)
Glucose-Capillary: 152 mg/dL — ABNORMAL HIGH (ref 70–99)
Glucose-Capillary: 174 mg/dL — ABNORMAL HIGH (ref 70–99)
Glucose-Capillary: 184 mg/dL — ABNORMAL HIGH (ref 70–99)

## 2018-11-30 LAB — ETHANOL: Alcohol, Ethyl (B): <10 mg/dL (ref <10)

## 2018-11-30 LAB — HEMOGLOBIN A1C
HEMOGLOBIN A1C: 8.3 % — AB (ref 4.8–5.6)
MEAN PLASMA GLUCOSE: 191.51 mg/dL

## 2018-11-30 LAB — URINE DRUG SCREEN, QUALITATIVE (ARMC ONLY)
AMPHETAMINES, UR SCREEN: NOT DETECTED
BENZODIAZEPINE, UR SCRN: POSITIVE — AB
Barbiturates, Ur Screen: NOT DETECTED
Cannabinoid 50 Ng, Ur ~~LOC~~: NOT DETECTED
Cocaine Metabolite,Ur ~~LOC~~: NOT DETECTED
MDMA (Ecstasy)Ur Screen: NOT DETECTED
Methadone Scn, Ur: NOT DETECTED
Opiate, Ur Screen: NOT DETECTED
PHENCYCLIDINE (PCP) UR S: NOT DETECTED
Tricyclic, Ur Screen: NOT DETECTED

## 2018-11-30 LAB — CBC
HCT: 34.4 % — ABNORMAL LOW (ref 39.0–52.0)
Hemoglobin: 12.2 g/dL — ABNORMAL LOW (ref 13.0–17.0)
MCH: 32.6 pg (ref 26.0–34.0)
MCHC: 35.5 g/dL (ref 30.0–36.0)
MCV: 92 fL (ref 80.0–100.0)
Platelets: 148 10*3/uL — ABNORMAL LOW (ref 150–400)
RBC: 3.74 MIL/uL — ABNORMAL LOW (ref 4.22–5.81)
RDW: 12.7 % (ref 11.5–15.5)
WBC: 4.7 10*3/uL (ref 4.0–10.5)
nRBC: 0 % (ref 0.0–0.2)

## 2018-11-30 LAB — MAGNESIUM: MAGNESIUM: 2.1 mg/dL (ref 1.7–2.4)

## 2018-11-30 MED ORDER — POTASSIUM CHLORIDE CRYS ER 20 MEQ PO TBCR
20.0000 meq | EXTENDED_RELEASE_TABLET | ORAL | Status: AC
  Administered 2018-11-30 (×2): 20 meq via ORAL
  Filled 2018-11-30 (×2): qty 1

## 2018-11-30 MED ORDER — INSULIN ASPART 100 UNIT/ML ~~LOC~~ SOLN
0.0000 [IU] | Freq: Every day | SUBCUTANEOUS | Status: DC
  Administered 2018-12-01: 4 [IU] via SUBCUTANEOUS
  Filled 2018-11-30: qty 1

## 2018-11-30 MED ORDER — PREMIER PROTEIN SHAKE
11.0000 [oz_av] | ORAL | Status: DC
  Administered 2018-11-30 – 2018-12-01 (×2): 11 [oz_av] via ORAL

## 2018-11-30 MED ORDER — LOSARTAN POTASSIUM 25 MG PO TABS
25.0000 mg | ORAL_TABLET | Freq: Every day | ORAL | Status: DC
  Administered 2018-11-30 – 2018-12-02 (×3): 25 mg via ORAL
  Filled 2018-11-30 (×3): qty 1

## 2018-11-30 MED ORDER — SODIUM BICARBONATE 8.4 % IV SOLN
150.0000 meq | Freq: Once | INTRAVENOUS | Status: AC
  Administered 2018-11-30: 150 meq via INTRAVENOUS
  Filled 2018-11-30: qty 150

## 2018-11-30 MED ORDER — POTASSIUM CHLORIDE CRYS ER 20 MEQ PO TBCR
20.0000 meq | EXTENDED_RELEASE_TABLET | Freq: Once | ORAL | Status: AC
  Administered 2018-11-30: 20 meq via ORAL
  Filled 2018-11-30: qty 1

## 2018-11-30 MED ORDER — INSULIN ASPART 100 UNIT/ML ~~LOC~~ SOLN
0.0000 [IU] | Freq: Three times a day (TID) | SUBCUTANEOUS | Status: DC
  Administered 2018-11-30 – 2018-12-01 (×3): 3 [IU] via SUBCUTANEOUS
  Administered 2018-12-01: 2 [IU] via SUBCUTANEOUS
  Administered 2018-12-01: 5 [IU] via SUBCUTANEOUS
  Administered 2018-12-02 (×2): 3 [IU] via SUBCUTANEOUS
  Filled 2018-11-30 (×7): qty 1

## 2018-11-30 MED ORDER — INSULIN GLARGINE 100 UNIT/ML ~~LOC~~ SOLN
15.0000 [IU] | Freq: Every day | SUBCUTANEOUS | Status: DC
  Administered 2018-11-30 – 2018-12-02 (×3): 15 [IU] via SUBCUTANEOUS
  Filled 2018-11-30 (×3): qty 0.15

## 2018-11-30 NOTE — Progress Notes (Signed)
Initial Nutrition Assessment  DOCUMENTATION CODES:   Not applicable  INTERVENTION:  Provide Premier Protein po daily, each supplement provides 160 kcal and 30 grams of protein. Patient prefers chocolate.  NUTRITION DIAGNOSIS:   Inadequate oral intake related to decreased appetite, nausea as evidenced by per patient/family report.  GOAL:   Patient will meet greater than or equal to 90% of their needs  MONITOR:   PO intake, Supplement acceptance, Labs, Weight trends, I & O's  REASON FOR ASSESSMENT:   Malnutrition Screening Tool    ASSESSMENT:   58 year old male with PMHx of HTN, HLD, anxiety, DM type 2, hx of testicular cancer, OSA, heart murmur, kidney stone admitted with DKA.   Met with patient at bedside. Patient reports that in the middle of November he fell and got a concussion. Since then he has had a decreased appetite. He also has had nausea over the past 1-1.5 weeks and has not been eating much. He did also stop taking his insulin for about 10 days, which can cause weight loss. He typically has a good appetite and intake. He reports his appetite has improved now and he was able to eat about 80% of his breakfast this morning. He is amenable to drinking Premier Protein to help meet his protein needs.  Patient reports his UBW is 248 lbs (112.7 kg). Per chart he was 114.2 kg on 10/07/2017 and there has been no weight since then until current weight of 100 kg (220.46 lbs). Patient has lost 14.2 kg (12.4% weight loss) sometime over the past year. No wasting of subcutaneous fat or muscle on nutrition focused physical exam, which would likely be present if he had lost this much weight over the past week or even month.  Meal Completion: 80%  Medications reviewed and include: Novolog 0-15 units TID, Novolog 0-5 units QHS, Lantus 15 units daily, MVI daily, pantoprazole, thiamine 100 mg daily IV.  Labs reviewed: CBG 113-174, Potassium 3.3, Chloride 112, CO2 15.  Patient does not  meet criteria for malnutrition.  Discussed with RN and on rounds.  NUTRITION - FOCUSED PHYSICAL EXAM:    Most Recent Value  Orbital Region  No depletion  Upper Arm Region  No depletion  Thoracic and Lumbar Region  No depletion  Buccal Region  No depletion  Temple Region  No depletion  Clavicle Bone Region  No depletion  Clavicle and Acromion Bone Region  No depletion  Scapular Bone Region  No depletion  Dorsal Hand  No depletion  Patellar Region  No depletion  Anterior Thigh Region  No depletion  Posterior Calf Region  No depletion  Edema (RD Assessment)  None  Hair  Reviewed  Eyes  Reviewed  Mouth  Reviewed  Skin  Reviewed  Nails  Reviewed     Diet Order:   Diet Order            Diet heart healthy/carb modified Room service appropriate? Yes; Fluid consistency: Thin  Diet effective now             EDUCATION NEEDS:   No education needs have been identified at this time  Skin:  Skin Assessment: Reviewed RN Assessment  Last BM:  Unknown/PTA  Height:   Ht Readings from Last 1 Encounters:  11/29/18 6' 1"  (1.854 m)   Weight:   Wt Readings from Last 1 Encounters:  11/29/18 100 kg   Ideal Body Weight:  83.6 kg  BMI:  Body mass index is 29.09 kg/m.  Estimated  Nutritional Needs:   Kcal:  2100-2300  Protein:  105-115 grams  Fluid:  2.1-2.3 L/day  Willey Blade, MS, RD, LDN Office: [phone] Pager: [phone] After Hours/Weekend Pager: [phone]

## 2018-11-30 NOTE — Care Management Note (Signed)
Case Management Note  Patient Details  Name: Nathan Calderon MRN: [id number] Date of Birth: August 27, 1961  Subjective/Objective:    Patient admitted with DKA requiring an insulin drip.  Patient is now off of the insulin drip and has floor orders.  RNCM consult for medication assistance.  Patient does not have insurance.  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  Patient lives alone but reports that he stays with his girlfriend often.  Patient drives and has reliable transportation.  PCP is  Dr. Elsworth Soho in [place] and he was followed at [facility] for testicular cancer now in remission.  Patient reports that it is very expensive to go to his PCP with no insurance his last visit was $250, that did include blood work.  The jardiance he was taken was provided by his doctor with free samples.   Patient has been getting his prescriptions from [facility] in [place] he reports they are pretty reasonable there.  $4 medications list from [facility] provided to patient and application for Medication Management and [place] also given.  Referral placed for Medication Management.  Patient lives in [place] in [place].   RNCM will cont to follow. Doran Clay RN BSN  [phone]                 Action/Plan:   Expected Discharge Date:                  Expected Discharge Plan:  Home/Self Care  In-House Referral:     Discharge planning Services  CM Consult  Post Acute Care Choice:    Choice offered to:     DME Arranged:    DME Agency:     HH Arranged:    HH Agency:     Status of Service:  In process, will continue to follow  If discussed at Long Length of Stay Meetings, dates discussed:    Additional Comments:  Shelbie Hutching, RN 11/30/2018, 11:56 AM

## 2018-11-30 NOTE — Progress Notes (Signed)
Inpatient Diabetes Program Recommendations  AACE/ADA: New Consensus Statement on Inpatient Glycemic Control (2019)  Target Ranges:  Prepandial:   less than 140 mg/dL      Peak postprandial:   less than 180 mg/dL (1-2 hours)      Critically ill patients:  140 - 180 mg/dL  Results for JOBAN, COLLEDGE (MRN [id number]) as of 11/30/2018 13:08  Ref. Range 11/30/2018 00:00 11/30/2018 00:57 11/30/2018 01:58 11/30/2018 02:59 11/30/2018 03:56 11/30/2018 04:57 11/30/2018 07:44 11/30/2018 11:30  Glucose-Capillary Latest Ref Range: 70 - 99 mg/dL 135 (H) 131 (H) 126 (H) 129 (H) 151 (H) 118 (H) 113 (H) 174 (H)  Results for TAELON, BENDORF (MRN [id number]) as of 11/30/2018 13:08  Ref. Range 11/29/2018 14:10  Glucose Latest Ref Range: 70 - 99 mg/dL 289 (H)   Results for RAWSON, MINIX (MRN [id number]) as of 11/30/2018 13:08  Ref. Range 11/29/2018 14:18  Hemoglobin A1C Latest Ref Range: 4.8 - 5.6 % 8.3 (H)   Review of Glycemic Control  Diabetes history: DM2 Outpatient Diabetes medications: Toujeo 85 units daily (ran out of Toujeo about 1 week ago), Jardiance 25 mg daily Current orders for Inpatient glycemic control: Lantus 15 units daily, Novolog 0-15 units TID with meals, Novolog 0-5 units QHS  Inpatient Diabetes Program Recommendations:  Insulin - Meal Coverage: Please consider ordering Novolog 3 units TID with meals for meal coverage if patient eats at least 50% of meals. HgbA1C: A1C 8.3% on 11/29/18 indicating an average glucose of 192 mg/dl over the past 2-3 months.  NOTE: Spoke with patient about diabetes and home regimen for diabetes control. Patient reports that he is followed by PCP for diabetes management. Patient does not have any insurance so when he sees PCP it cost about $250 per visit.  Patient states that he was taking Toujeo 85 units daily and Jardiance 25 mg daily as an outpatient for diabetes control. However, patient states that he ran out of Toujeo about 1 week ago and he was not able to get it refilled yet due to  cost (states it will cost $100 to get refilled with his discount card).  Patient also notes that he was using a discount card for the [place] but when he went to refill it last time the discount card has ran out but he was able to get some samples from PCP.  Discussed Jardiance in more detail with patient and explained how it works to DM control. Explained that because Jardiance decreases amount of available glucose, it likely made glucose appear okay. However, since patient ran out of Toujeo and was not able to take any insulin, the cells were not able to use the glucose effectively and caused him to go into DKA.  Discussed DKA in more detail.  Patient reports that he was not aware of side effects of Jardiance or what would happen without his insulin.  Patient reports that when he was taking Toujeo and Jardiance, his glucose ran fairly well in the 100's mg/dl. Patient notes that since he ran out of Toujeo his glucose has not been too bad "in the upper 100's and low 200's mg/dl.   Inquired about prior A1C and patient reports that he does not recall his last A1C value. Discussed A1C results (8.3% on 11/29/18) and explained that his current A1C indicates an average glucose of 192 mg/dl over the past 2-3 months. Discussed glucose and A1C goals. Discussed importance of checking CBGs and maintaining good CBG control to prevent long-term and short-term complications.  Inquired about whether patient has tried to go to any clinics in the community and patient stated "I have never been the type to ask for handouts so I have never been to any clinics."  Explained that most communities have clinics that are avaible to individuals with no insurance and allows access to healthcare at low cost and may even be able to assist with medication cost. Patient lives in [place] and states he would be open to finding out which type of resources and clinics are available in [place]. CM already consulted and following.   Informed patient there are more affordable insulin at [facility] for $25 per vial; however,due to the dose of Toujeo he takes he would need about 3 vials of insulin per month which is still going to cost at least $75 per month. Patient prefers to continue taking Toujeo if possible. Patient notes that he does not plan to continue taking Jardiance and also states that he is NOT willing to take Metformin as an outpatient. However, he is open to taking additional oral DM medications if prescribed and he is able to afford them.  Patient verbalized understanding of information discussed and he states that he has no further questions at this time related to diabetes.  Thanks, Barnie Alderman, RN, MSN, CDE Diabetes Coordinator Inpatient Diabetes Program [phone] (Team Pager)

## 2018-11-30 NOTE — Progress Notes (Signed)
PHARMACY CONSULT NOTE - FOLLOW UP  Pharmacy Consult for Electrolyte Monitoring and Replacement   Recent Labs: Potassium (mmol/L)  Date Value  11/30/2018 3.3 (L)   Magnesium (mg/dL)  Date Value  11/30/2018 2.1   Calcium (mg/dL)  Date Value  11/30/2018 7.4 (L)   Albumin (g/dL)  Date Value  10/07/2017 3.6    Assessment: 58 year old male with DKA  Goal of Therapy:  Potassium ~ 4 and magnesium ~ 2  Plan:  Patient received potassium 20 mEq PO x 1 after BMP drawn this am. Will give an additional dose of potassium 20 mEq PO x 1 and recheck potassium with morning labs.  Magnesium WNL. No supplementation at this time.  Tawnya Crook, PharmD Pharmacy Resident  11/30/2018 4:36 PM

## 2018-11-30 NOTE — Progress Notes (Signed)
[facility] NAME: Nathan Calderon    MR#:  [id number]  DATE OF BIRTH:  01-25-1961  SUBJECTIVE:  CHIEF COMPLAINT: pt is feeling weak    REVIEW OF SYSTEMS:  CONSTITUTIONAL: No fever, fatigue or weakness.  EYES: No blurred or double vision.  EARS, NOSE, AND THROAT: No tinnitus or ear pain.  RESPIRATORY: No cough, shortness of breath, wheezing or hemoptysis.  CARDIOVASCULAR: No chest pain, orthopnea, edema.  GASTROINTESTINAL: No nausea, vomiting, diarrhea or abdominal pain.  GENITOURINARY: No dysuria, hematuria.  ENDOCRINE:has  polyuria, nocturia,  HEMATOLOGY: No anemia, easy bruising or bleeding SKIN: No rash or lesion. MUSCULOSKELETAL: No joint pain or arthritis.   NEUROLOGIC: No tingling, numbness, weakness.  PSYCHIATRY: No anxiety or depression.   DRUG ALLERGIES:   Allergies  Allergen Reactions  . Jardiance [Empagliflozin] Other (See Comments)    DKA     VITALS:  Blood pressure 140/72, pulse 90, temperature 98.6 F (37 C), temperature source Oral, resp. rate 17, height 6\' 1"  (1.854 m), weight 100 kg, SpO2 (!) 84 %.  PHYSICAL EXAMINATION:  GENERAL:  58 y.o.-year-old patient lying in the bed with no acute distress.  EYES: Pupils equal, round, reactive to light and accommodation. No scleral icterus. Extraocular muscles intact.  HEENT: Head atraumatic, normocephalic. Oropharynx and nasopharynx clear.  NECK:  Supple, no jugular venous distention. No thyroid enlargement, no tenderness.  LUNGS: Normal breath sounds bilaterally, no wheezing, rales,rhonchi or crepitation. No use of accessory muscles of respiration.  CARDIOVASCULAR: S1, S2 normal. No murmurs, rubs, or gallops.  ABDOMEN: Soft, nontender, nondistended. Bowel sounds present. Marland Kitchen  EXTREMITIES: No pedal edema, cyanosis, or clubbing.  NEUROLOGIC: Awake and alert sensation intact. Gait not checked.  PSYCHIATRIC: The patient is alert and oriented x 3.  SKIN: No  obvious rash, lesion, or ulcer.    LABORATORY PANEL:   CBC Recent Labs  Lab 11/30/18 0600  WBC 4.7  HGB 12.2*  HCT 34.4*  PLT 148*   ------------------------------------------------------------------------------------------------------------------  Chemistries  Recent Labs  Lab 11/30/18 0600  NA 140  K 3.3*  CL 112*  CO2 15*  GLUCOSE 134*  BUN 14  CREATININE 0.91  CALCIUM 7.4*  MG 2.1   ------------------------------------------------------------------------------------------------------------------  Cardiac Enzymes Recent Labs  Lab 11/29/18 1410  TROPONINI <0.03   ------------------------------------------------------------------------------------------------------------------  RADIOLOGY:  Dg Chest 2 View  Result Date: 11/29/2018 CLINICAL DATA:  Generalized weakness. 28 pound weight loss over 1 week. EXAM: CHEST - 2 VIEW COMPARISON:  July 13, 2017 FINDINGS: The heart size and mediastinal contours are within normal limits. Both lungs are clear. The visualized skeletal structures are stable. IMPRESSION: No active cardiopulmonary disease. Electronically Signed   By: Abelardo Diesel M.D.   On: 11/29/2018 14:52   Ct Head Wo Contrast  Result Date: 11/29/2018 CLINICAL DATA:  Head trauma.  Ataxia. EXAM: CT HEAD WITHOUT CONTRAST TECHNIQUE: Contiguous axial images were obtained from the base of the skull through the vertex without intravenous contrast. COMPARISON:  Head CT 10/17/2018 FINDINGS: Brain: No evidence for acute hemorrhage, mass lesion, midline shift, hydrocephalus or large infarct. Vascular: No hyperdense vessel or unexpected calcification. Skull: Normal. Negative for fracture or focal lesion. Sinuses/Orbits: No acute finding. Other: None. IMPRESSION: Negative head CT. Electronically Signed   By: Markus Daft M.D.   On: 11/29/2018 13:49   Ct Abdomen Pelvis W Contrast  Result Date: 11/29/2018 CLINICAL DATA:  58 year old who states that he has lost over 25 pounds in the  past few weeks. One month history of progressively increasing generalized weakness. Personal history of testicular cancer. EXAM: CT ABDOMEN AND PELVIS WITH CONTRAST TECHNIQUE: Multidetector CT imaging of the abdomen and pelvis was performed using the standard protocol following bolus administration of intravenous contrast. CONTRAST:  151mL ISOVUE-300 IOPAMIDOL INJECTION 61% IV. Oral contrast was also administered. COMPARISON:  09/28/2017, 09/28/2016 and earlier. FINDINGS: Lower chest: Heart size normal. Mild LEFT circumflex artery atherosclerosis. Visualized lung bases clear. Hepatobiliary: Mild diffuse hepatic steatosis without focal hepatic parenchymal abnormality. Gallbladder normal in appearance without calcified gallstones. No biliary ductal dilation. Pancreas: Atrophy involving the pancreatic head with fatty replacement. No pancreatic mass or peripancreatic inflammation. Spleen: Normal in size and appearance. Adrenals/Urinary Tract: Normal appearing adrenal glands. Non-obstructing LEFT LOWER pole renal calculi, the largest measuring approximately 6 mm. No ureteral calculi elsewhere on either side. No hydronephrosis. No focal parenchymal abnormality involving either kidney. Normal appearing urinary bladder. Stomach/Bowel: Very small hiatal hernia, more conspicuous on today's examination. Stomach otherwise normal in appearance. Mild thickening of the wall of the distal esophagus. Normal-appearing small bowel. Ascending colon and proximal transverse colon demonstrating wall thickening, though the segments are decompressed. Moderate stool burden involving the cecum and the distal transverse colon with expected stool burden elsewhere. Scattered descending and sigmoid colon diverticula without evidence of acute diverticulitis. Normal appendix in the RIGHT UPPER pelvis. Vascular/Lymphatic: Moderate aorto-iliofemoral atherosclerosis without evidence of aneurysm. Normal-appearing portal venous and systemic venous  systems. No pathologic lymphadenopathy. Reproductive: Prior RIGHT orchiectomy. High-riding LEFT testicle in the inguinal canal. Small LEFT inguinal hernia containing fat. Small amount of fluid in the LEFT inguinal canal. Thickening of the skin of the scrotum anteriorly is related to post surgical scar as it is unchanged over multiple prior examinations. Other: None. Musculoskeletal: Spondylosis involving the visualized LOWER thoracic spine. Mild degenerative disc disease at L5-S1. No acute findings. IMPRESSION: 1. Wall thickening involving the ascending colon and proximal transverse colon, possibly indicating colitis, but the wall thickening may just be related to the fact that these segments are decompressed. Does the patient have diarrhea or other symptoms that might make 1 suspect colitis? 2. Mild diffuse hepatic steatosis without focal hepatic parenchymal abnormality. 3. Non-obstructing LEFT LOWER pole renal calculi, the largest measuring approximately 6 mm. 4. Very small hiatal hernia. Mild thickening of the wall of the distal esophagus may indicate GE reflux disease. 5. Scattered descending and sigmoid colon diverticula without evidence of acute diverticulitis. 6. High-riding LEFT testicle located in the inguinal canal. Small LEFT inguinal hernia containing fat. Small amount of fluid in the LEFT inguinal canal. 7. Prior RIGHT orchiectomy. Aortic Atherosclerosis (ICD10-170.0) Electronically Signed   By: Evangeline Dakin M.D.   On: 11/29/2018 18:17    EKG:   Orders placed or performed during the hospital encounter of 11/29/18  . ED EKG  . ED EKG    ASSESSMENT AND PLAN:    1.  Diabetic ketoacidosis.   Ran out of his Toujeo 1 week ago  Of insulin drip on Lantus 15 units of daily added as needed Patient  does not plan to continue taking Jardiance and  Metformin  Patient is very acidotic. IV fluids   Hemoglobin A1c added onto labs drawn in the ER. 2.  Hypertension.    Resume home meds 3.   Hyperlipidemia unspecified continue statin 4.  Anxiety.  Xanax 5.  Hyponatremia secondary to diabetic ketoacidosis. 6.  History of alcohol overuse.  CIWA.  Patient states he has not drank anything in 2 weeks.  7.  GERD on PPI    All the records are reviewed and case discussed with Care Management/Social Workerr. Management plans discussed with the patient, family and they are in agreement.  CODE STATUS: fc  TOTAL TIME TAKING CARE OF THIS PATIENT: 35 minutes.   POSSIBLE D/C IN 2 DAYS, DEPENDING ON CLINICAL CONDITION.  Note: This dictation was prepared with Dragon dictation along with smaller phrase technology. Any transcriptional errors that result from this process are unintentional.   Nicholes Mango M.D on 11/30/2018 at 4:21 PM  Between 7am to 6pm - Pager - [phone] After 6pm go to [email or web address] - password [email or web address] [facility]  Office  [phone]  CC: Primary care physician; Philmore Pali, NP

## 2018-12-01 LAB — HEMOGLOBIN A1C
Hgb A1c MFr Bld: 8.2 % — ABNORMAL HIGH (ref 4.8–5.6)
Mean Plasma Glucose: 188.64 mg/dL

## 2018-12-01 LAB — BASIC METABOLIC PANEL
Anion gap: 15 (ref 5–15)
BUN: 11 mg/dL (ref 6–20)
CO2: 16 mmol/L — ABNORMAL LOW (ref 22–32)
CREATININE: 1 mg/dL (ref 0.61–1.24)
Calcium: 8.3 mg/dL — ABNORMAL LOW (ref 8.9–10.3)
Chloride: 110 mmol/L (ref 98–111)
GFR calc Af Amer: >60 mL/min (ref >60)
GFR calc non Af Amer: >60 mL/min (ref >60)
Glucose, Bld: 145 mg/dL — ABNORMAL HIGH (ref 70–99)
Potassium: 3.2 mmol/L — ABNORMAL LOW (ref 3.5–5.1)
Sodium: 141 mmol/L (ref 135–145)

## 2018-12-01 LAB — GLUCOSE, CAPILLARY
Glucose-Capillary: 144 mg/dL — ABNORMAL HIGH (ref 70–99)
Glucose-Capillary: 215 mg/dL — ABNORMAL HIGH (ref 70–99)
Glucose-Capillary: 192 mg/dL — ABNORMAL HIGH (ref 70–99)
Glucose-Capillary: 309 mg/dL — ABNORMAL HIGH (ref 70–99)

## 2018-12-01 LAB — HIV ANTIBODY (ROUTINE TESTING W REFLEX): HIV Screen 4th Generation wRfx: NONREACTIVE

## 2018-12-01 MED ORDER — VITAMIN B-1 100 MG PO TABS
100.0000 mg | ORAL_TABLET | Freq: Every day | ORAL | Status: DC
  Administered 2018-12-01 – 2018-12-02 (×2): 100 mg via ORAL
  Filled 2018-12-01 (×2): qty 1

## 2018-12-01 MED ORDER — POTASSIUM CHLORIDE CRYS ER 20 MEQ PO TBCR: 40.0000 meq | EXTENDED_RELEASE_TABLET | Freq: Once | ORAL | Status: DC

## 2018-12-01 MED ORDER — POTASSIUM CHLORIDE CRYS ER 20 MEQ PO TBCR
40.0000 meq | EXTENDED_RELEASE_TABLET | ORAL | Status: AC
  Administered 2018-12-01 (×2): 40 meq via ORAL
  Filled 2018-12-01 (×2): qty 2

## 2018-12-01 MED ORDER — INSULIN ASPART 100 UNIT/ML ~~LOC~~ SOLN
3.0000 [IU] | Freq: Three times a day (TID) | SUBCUTANEOUS | Status: DC
  Administered 2018-12-01 – 2018-12-02 (×4): 3 [IU] via SUBCUTANEOUS
  Filled 2018-12-01 (×4): qty 1

## 2018-12-01 NOTE — Progress Notes (Addendum)
[facility] NAME: Nathan Calderon    MR#:  [id number]  DATE OF BIRTH:  February 24, 1961  SUBJECTIVE:  CHIEF COMPLAINT: pt is feeling ok, off insulin gtt  REVIEW OF SYSTEMS:  CONSTITUTIONAL: No fever, fatigue or weakness.  EYES: No blurred or double vision.  EARS, NOSE, AND THROAT: No tinnitus or ear pain.  RESPIRATORY: No cough, shortness of breath, wheezing or hemoptysis.  CARDIOVASCULAR: No chest pain, orthopnea, edema.  GASTROINTESTINAL: No nausea, vomiting, diarrhea or abdominal pain.  GENITOURINARY: No dysuria, hematuria.  ENDOCRINE:has  polyuria, nocturia,  HEMATOLOGY: No anemia, easy bruising or bleeding SKIN: No rash or lesion. MUSCULOSKELETAL: No joint pain or arthritis.   NEUROLOGIC: No tingling, numbness, weakness.  PSYCHIATRY: No anxiety or depression.   DRUG ALLERGIES:   Allergies  Allergen Reactions  . Jardiance [Empagliflozin] Other (See Comments)    DKA     VITALS:  Blood pressure (!) 167/83, pulse 83, temperature 97.7 F (36.5 C), temperature source Oral, resp. rate 16, height 6\' 1"  (1.854 m), weight 100 kg, SpO2 100 %.  PHYSICAL EXAMINATION:  GENERAL:  58 y.o.-year-old patient lying in the bed with no acute distress.  EYES: Pupils equal, round, reactive to light and accommodation. No scleral icterus. Extraocular muscles intact.  HEENT: Head atraumatic, normocephalic. Oropharynx and nasopharynx clear.  NECK:  Supple, no jugular venous distention. No thyroid enlargement, no tenderness.  LUNGS: Normal breath sounds bilaterally, no wheezing, rales,rhonchi or crepitation. No use of accessory muscles of respiration.  CARDIOVASCULAR: S1, S2 normal. No murmurs, rubs, or gallops.  ABDOMEN: Soft, nontender, nondistended. Bowel sounds present. Marland Kitchen  EXTREMITIES: No pedal edema, cyanosis, or clubbing.  NEUROLOGIC: Awake and alert sensation intact. Gait not checked.  PSYCHIATRIC: The patient is alert and oriented x 3.   SKIN: No obvious rash, lesion, or ulcer.    LABORATORY PANEL:   CBC Recent Labs  Lab 11/30/18 0600  WBC 4.7  HGB 12.2*  HCT 34.4*  PLT 148*   ------------------------------------------------------------------------------------------------------------------  Chemistries  Recent Labs  Lab 11/30/18 0600 12/01/18 0249  NA 140 141  K 3.3* 3.2*  CL 112* 110  CO2 15* 16*  GLUCOSE 134* 145*  BUN 14 11  CREATININE 0.91 1.00  CALCIUM 7.4* 8.3*  MG 2.1  --    ------------------------------------------------------------------------------------------------------------------  Cardiac Enzymes Recent Labs  Lab 11/29/18 1410  TROPONINI <0.03   ------------------------------------------------------------------------------------------------------------------  RADIOLOGY:  Dg Chest 2 View  Result Date: 11/29/2018 CLINICAL DATA:  Generalized weakness. 28 pound weight loss over 1 week. EXAM: CHEST - 2 VIEW COMPARISON:  July 13, 2017 FINDINGS: The heart size and mediastinal contours are within normal limits. Both lungs are clear. The visualized skeletal structures are stable. IMPRESSION: No active cardiopulmonary disease. Electronically Signed   By: Abelardo Diesel M.D.   On: 11/29/2018 14:52   Ct Abdomen Pelvis W Contrast  Result Date: 11/29/2018 CLINICAL DATA:  58 year old who states that he has lost over 25 pounds in the past few weeks. One month history of progressively increasing generalized weakness. Personal history of testicular cancer. EXAM: CT ABDOMEN AND PELVIS WITH CONTRAST TECHNIQUE: Multidetector CT imaging of the abdomen and pelvis was performed using the standard protocol following bolus administration of intravenous contrast. CONTRAST:  161mL ISOVUE-300 IOPAMIDOL INJECTION 61% IV. Oral contrast was also administered. COMPARISON:  09/28/2017, 09/28/2016 and earlier. FINDINGS: Lower chest: Heart size normal. Mild LEFT circumflex artery atherosclerosis. Visualized lung bases  clear. Hepatobiliary: Mild diffuse hepatic steatosis without focal  hepatic parenchymal abnormality. Gallbladder normal in appearance without calcified gallstones. No biliary ductal dilation. Pancreas: Atrophy involving the pancreatic head with fatty replacement. No pancreatic mass or peripancreatic inflammation. Spleen: Normal in size and appearance. Adrenals/Urinary Tract: Normal appearing adrenal glands. Non-obstructing LEFT LOWER pole renal calculi, the largest measuring approximately 6 mm. No ureteral calculi elsewhere on either side. No hydronephrosis. No focal parenchymal abnormality involving either kidney. Normal appearing urinary bladder. Stomach/Bowel: Very small hiatal hernia, more conspicuous on today's examination. Stomach otherwise normal in appearance. Mild thickening of the wall of the distal esophagus. Normal-appearing small bowel. Ascending colon and proximal transverse colon demonstrating wall thickening, though the segments are decompressed. Moderate stool burden involving the cecum and the distal transverse colon with expected stool burden elsewhere. Scattered descending and sigmoid colon diverticula without evidence of acute diverticulitis. Normal appendix in the RIGHT UPPER pelvis. Vascular/Lymphatic: Moderate aorto-iliofemoral atherosclerosis without evidence of aneurysm. Normal-appearing portal venous and systemic venous systems. No pathologic lymphadenopathy. Reproductive: Prior RIGHT orchiectomy. High-riding LEFT testicle in the inguinal canal. Small LEFT inguinal hernia containing fat. Small amount of fluid in the LEFT inguinal canal. Thickening of the skin of the scrotum anteriorly is related to post surgical scar as it is unchanged over multiple prior examinations. Other: None. Musculoskeletal: Spondylosis involving the visualized LOWER thoracic spine. Mild degenerative disc disease at L5-S1. No acute findings. IMPRESSION: 1. Wall thickening involving the ascending colon and proximal  transverse colon, possibly indicating colitis, but the wall thickening may just be related to the fact that these segments are decompressed. Does the patient have diarrhea or other symptoms that might make 1 suspect colitis? 2. Mild diffuse hepatic steatosis without focal hepatic parenchymal abnormality. 3. Non-obstructing LEFT LOWER pole renal calculi, the largest measuring approximately 6 mm. 4. Very small hiatal hernia. Mild thickening of the wall of the distal esophagus may indicate GE reflux disease. 5. Scattered descending and sigmoid colon diverticula without evidence of acute diverticulitis. 6. High-riding LEFT testicle located in the inguinal canal. Small LEFT inguinal hernia containing fat. Small amount of fluid in the LEFT inguinal canal. 7. Prior RIGHT orchiectomy. Aortic Atherosclerosis (ICD10-170.0) Electronically Signed   By: Evangeline Dakin M.D.   On: 11/29/2018 18:17    EKG:   Orders placed or performed during the hospital encounter of 11/29/18  . ED EKG  . ED EKG    ASSESSMENT AND PLAN:    1.  Diabetic ketoacidosis.  Clinically feeling better ran out of his Toujeo 1 week ago  was taking approximately 80 units on daily basis currently he is on Lantus 15 units only Continue close monitoring of the sugars and adjust Toujeo dose prior to discharge Off insulin drip   Lantus 15 units of daily titrate medication Patient  does not plan to continue taking Jardiance and  Metformin . IV fluids   Hemoglobin A1c Consult case management and social worker for medication assistance  2.  Hypertension.    Resume home meds 3.  Hyperlipidemia unspecified continue statin 4.  Anxiety.  Xanax 5.  Hyponatremia secondary to diabetic ketoacidosis. 6.  History of alcohol overuse.  CIWA.  Patient states he has not drank anything in 2 weeks.  7.  GERD on PPI    All the records are reviewed and case discussed with Care Management/Social Workerr. Management plans discussed with the patient,  family and they are in agreement.  CODE STATUS: fc  TOTAL TIME TAKING CARE OF THIS PATIENT: 35 minutes.   POSSIBLE D/C IN 1 DAYS, DEPENDING  ON CLINICAL CONDITION.  Note: This dictation was prepared with Dragon dictation along with smaller phrase technology. Any transcriptional errors that result from this process are unintentional.   Nicholes Mango M.D on 12/01/2018 at 2:03 PM  Between 7am to 6pm - Pager - [phone] After 6pm go to [email or web address] - password [email or web address] [facility]  Office  [phone]  CC: Primary care physician; Philmore Pali, NP

## 2018-12-01 NOTE — Progress Notes (Signed)
Follow up - Critical Care Medicine Note  Patient Details:    Nathan Calderon is an 58 y.o. male.  With a past medical history of testicular cancer, obstructive sleep apnea, nephrolithiasis, hypertension, hyperlipidemia, type 2 diabetes, anxiety, angina presented to the emergency department with weakness, worsening fatigue found to be severely acidotic and in DKA.  Anti-infectives:  Anti-infectives (From admission, onward)   None      Microbiology: Results for orders placed or performed during the hospital encounter of 11/29/18  MRSA PCR Screening     Status: None   Collection Time: 11/29/18  9:53 PM  Result Value Ref Range Status   MRSA by PCR NEGATIVE NEGATIVE Final    Comment:        The GeneXpert MRSA Assay (FDA approved for NASAL specimens only), is one component of a comprehensive MRSA colonization surveillance program. It is not intended to diagnose MRSA infection nor to guide or monitor treatment for MRSA infections. Performed at [facility], [address] [place], [place] [address]    Studies: Dg Chest 2 View  Result Date: 11/29/2018 CLINICAL DATA:  Generalized weakness. 28 pound weight loss over 1 week. EXAM: CHEST - 2 VIEW COMPARISON:  July 13, 2017 FINDINGS: The heart size and mediastinal contours are within normal limits. Both lungs are clear. The visualized skeletal structures are stable. IMPRESSION: No active cardiopulmonary disease. Electronically Signed   By: Abelardo Diesel M.D.   On: 11/29/2018 14:52   Ct Head Wo Contrast  Result Date: 11/29/2018 CLINICAL DATA:  Head trauma.  Ataxia. EXAM: CT HEAD WITHOUT CONTRAST TECHNIQUE: Contiguous axial images were obtained from the base of the skull through the vertex without intravenous contrast. COMPARISON:  Head CT 10/17/2018 FINDINGS: Brain: No evidence for acute hemorrhage, mass lesion, midline shift, hydrocephalus or large infarct. Vascular: No hyperdense vessel or unexpected calcification. Skull: Normal.  Negative for fracture or focal lesion. Sinuses/Orbits: No acute finding. Other: None. IMPRESSION: Negative head CT. Electronically Signed   By: Markus Daft M.D.   On: 11/29/2018 13:49   Ct Abdomen Pelvis W Contrast  Result Date: 11/29/2018 CLINICAL DATA:  58 year old who states that he has lost over 25 pounds in the past few weeks. One month history of progressively increasing generalized weakness. Personal history of testicular cancer. EXAM: CT ABDOMEN AND PELVIS WITH CONTRAST TECHNIQUE: Multidetector CT imaging of the abdomen and pelvis was performed using the standard protocol following bolus administration of intravenous contrast. CONTRAST:  187mL ISOVUE-300 IOPAMIDOL INJECTION 61% IV. Oral contrast was also administered. COMPARISON:  09/28/2017, 09/28/2016 and earlier. FINDINGS: Lower chest: Heart size normal. Mild LEFT circumflex artery atherosclerosis. Visualized lung bases clear. Hepatobiliary: Mild diffuse hepatic steatosis without focal hepatic parenchymal abnormality. Gallbladder normal in appearance without calcified gallstones. No biliary ductal dilation. Pancreas: Atrophy involving the pancreatic head with fatty replacement. No pancreatic mass or peripancreatic inflammation. Spleen: Normal in size and appearance. Adrenals/Urinary Tract: Normal appearing adrenal glands. Non-obstructing LEFT LOWER pole renal calculi, the largest measuring approximately 6 mm. No ureteral calculi elsewhere on either side. No hydronephrosis. No focal parenchymal abnormality involving either kidney. Normal appearing urinary bladder. Stomach/Bowel: Very small hiatal hernia, more conspicuous on today's examination. Stomach otherwise normal in appearance. Mild thickening of the wall of the distal esophagus. Normal-appearing small bowel. Ascending colon and proximal transverse colon demonstrating wall thickening, though the segments are decompressed. Moderate stool burden involving the cecum and the distal transverse colon  with expected stool burden elsewhere. Scattered descending and sigmoid colon diverticula  without evidence of acute diverticulitis. Normal appendix in the RIGHT UPPER pelvis. Vascular/Lymphatic: Moderate aorto-iliofemoral atherosclerosis without evidence of aneurysm. Normal-appearing portal venous and systemic venous systems. No pathologic lymphadenopathy. Reproductive: Prior RIGHT orchiectomy. High-riding LEFT testicle in the inguinal canal. Small LEFT inguinal hernia containing fat. Small amount of fluid in the LEFT inguinal canal. Thickening of the skin of the scrotum anteriorly is related to post surgical scar as it is unchanged over multiple prior examinations. Other: None. Musculoskeletal: Spondylosis involving the visualized LOWER thoracic spine. Mild degenerative disc disease at L5-S1. No acute findings. IMPRESSION: 1. Wall thickening involving the ascending colon and proximal transverse colon, possibly indicating colitis, but the wall thickening may just be related to the fact that these segments are decompressed. Does the patient have diarrhea or other symptoms that might make 1 suspect colitis? 2. Mild diffuse hepatic steatosis without focal hepatic parenchymal abnormality. 3. Non-obstructing LEFT LOWER pole renal calculi, the largest measuring approximately 6 mm. 4. Very small hiatal hernia. Mild thickening of the wall of the distal esophagus may indicate GE reflux disease. 5. Scattered descending and sigmoid colon diverticula without evidence of acute diverticulitis. 6. High-riding LEFT testicle located in the inguinal canal. Small LEFT inguinal hernia containing fat. Small amount of fluid in the LEFT inguinal canal. 7. Prior RIGHT orchiectomy. Aortic Atherosclerosis (ICD10-170.0) Electronically Signed   By: Evangeline Dakin M.D.   On: 11/29/2018 18:17    Consults: Treatment Team:  Pccm, Ander Gaster, MD   Subjective:    Overnight Issues: No issues overnight.  Patient doing well  Objective:   Vital signs for last 24 hours: Temp:  [98.9 F (37.2 C)-99.1 F (37.3 C)] 99.1 F (37.3 C) (01/03 0600) Pulse Rate:  [78-104] 92 (01/03 0600) Resp:  [12-22] 15 (01/03 0600) BP: (124-161)/(63-87) 124/63 (01/03 0600) SpO2:  [84 %-100 %] 100 % (01/03 0600)  Hemodynamic parameters for last 24 hours:    Intake/Output from previous day: 01/02 0701 - 01/03 0700 In: 840 [P.O.:840] Out: 1000 [Urine:1000]  Intake/Output this shift: No intake/output data recorded.  Physical Exam:  General: well developed, well nourished, resting in bed NAD  Neuro: alert and oriented, follows commands  HEENT: supple, no JVD  Cardiovascular: nsr, rrr, no R/G  Lungs: clear throughout, even, non labored  Abdomen: +BS x4, obese, soft, non tender, non distended  Musculoskeletal: normal bulk and tone, no edema  Skin: intact no rashes or lesions   Assessment/Plan:   DKA.  Blood sugar is 145.  Patient's management at this point has been weaned off of insulin infusion, is on Lantus 15 units with sliding scale.  Would avoid Jardiance in the future as this may have been a reaction to the Jardiance with acidosis.  At this point patient is doing well and stable for floor transfer  Hyperlipidemia.  Continue statin  Hypertension.  Patient is on losartan and amlodipine  Hermelinda Dellen, DO  Kendria Halberg 12/01/2018  *Care during the described time interval was provided by me and/or other providers on the critical care team.  I have reviewed this patient's available data, including medical history, events of note, physical examination and test results as part of my evaluation.

## 2018-12-01 NOTE — Progress Notes (Signed)
PHARMACY CONSULT NOTE - FOLLOW UP  Pharmacy Consult for Electrolyte Monitoring and Replacement   Recent Labs: Potassium (mmol/L)  Date Value  12/01/2018 3.2 (L)   Magnesium (mg/dL)  Date Value  11/30/2018 2.1   Calcium (mg/dL)  Date Value  12/01/2018 8.3 (L)   Albumin (g/dL)  Date Value  10/07/2017 3.6    Assessment: Pharmacy consulted for electrolyte monitoring and replacement in 58 year old male admitted with DKA  Goal of Therapy:  Potassium ~ 4 and magnesium ~ 2  Plan:  01/03 K: 3.2. Patient received KCl 2mEq yesterday and K+ went down to 3.2 from 3.3. Will order KCL 54mEq every 4 hours x 2 doses today.  No additional  supplementation needed at this time.  Will recheck electrolytes with AM labs and continue replace as needed.   Pernell Dupre, PharmD, BCPS Clinical Pharmacist 12/01/2018 7:23 AM

## 2018-12-02 LAB — GLUCOSE, CAPILLARY
Glucose-Capillary: 164 mg/dL — ABNORMAL HIGH (ref 70–99)
Glucose-Capillary: 232 mg/dL — ABNORMAL HIGH (ref 70–99)

## 2018-12-02 LAB — BASIC METABOLIC PANEL
Anion gap: 11 (ref 5–15)
BUN: 13 mg/dL (ref 6–20)
CO2: 21 mmol/L — ABNORMAL LOW (ref 22–32)
Calcium: 8.4 mg/dL — ABNORMAL LOW (ref 8.9–10.3)
Chloride: 103 mmol/L (ref 98–111)
Creatinine, Ser: 0.81 mg/dL (ref 0.61–1.24)
GFR calc Af Amer: >60 mL/min (ref >60)
GFR calc non Af Amer: >60 mL/min (ref >60)
Glucose, Bld: 190 mg/dL — ABNORMAL HIGH (ref 70–99)
Potassium: 3.6 mmol/L (ref 3.5–5.1)
Sodium: 135 mmol/L (ref 135–145)

## 2018-12-02 LAB — HEMOGLOBIN A1C
Hgb A1c MFr Bld: 8.2 % — ABNORMAL HIGH (ref 4.8–5.6)
Mean Plasma Glucose: 188.64 mg/dL

## 2018-12-02 LAB — MAGNESIUM: MAGNESIUM: 2.2 mg/dL (ref 1.7–2.4)

## 2018-12-02 MED ORDER — PREMIER PROTEIN SHAKE: 11.0000 [oz_av] | ORAL | 1 refills | Status: AC

## 2018-12-02 MED ORDER — INSULIN GLARGINE 300 UNIT/ML ~~LOC~~ SOPN: 17.0000 [IU] | PEN_INJECTOR | Freq: Every evening | SUBCUTANEOUS | 0 refills | Status: DC

## 2018-12-02 MED ORDER — POTASSIUM CHLORIDE CRYS ER 20 MEQ PO TBCR
40.0000 meq | EXTENDED_RELEASE_TABLET | Freq: Once | ORAL | Status: AC
  Administered 2018-12-02: 40 meq via ORAL
  Filled 2018-12-02: qty 2

## 2018-12-02 MED ORDER — ENOXAPARIN SODIUM 40 MG/0.4ML ~~LOC~~ SOLN: 40.0000 mg | SUBCUTANEOUS | Status: DC

## 2018-12-02 NOTE — Discharge Summary (Signed)
[place] [facility] [place] NAME: Nathan Calderon    MR#:  [id number]  DATE OF BIRTH:  [birth year]  DATE OF ADMISSION:  11/29/2018 ADMITTING PHYSICIAN: Loletha Grayer, MD  DATE OF DISCHARGE: 12/02/2018  PRIMARY CARE PHYSICIAN: Philmore Pali, NP    ADMISSION DIAGNOSIS:  Diabetic ketoacidosis without coma associated with other specified diabetes mellitus ([place]) [E13.10]  DISCHARGE DIAGNOSIS:  Active Problems:   DKA (diabetic ketoacidoses) ([place])   SECONDARY DIAGNOSIS:   Past Medical History:  Diagnosis Date  . Angina    2006, had cardiac cath at the time  . Anxiety   . Diabetes mellitus    Type 2  . Headache(784.0)   . Heart murmur    as child  . Hyperlipidemia   . Hypertension   . Kidney stone    hx of  . Motion sickness    ocean boats  . Sleep apnea    no CPAP.  Never got it.  . Testicular cancer ([place]) 04/08/2016    HOSPITAL COURSE:   58 year old male with a history of diabetes who presented in DKA.  1.  DKA: DKA has resolved.  He was initially admitted to stepdown unit on DKA protocol.  Patient does not plan on taking Jardiance and metformin.  His A1c was 8.2.  He was evaluated by diabetes coordinator.  He will continue Toujeo.  He was on Lantus 15 units while in the hospital.  His blood sugars remain between 1 30-2 20.  He will be discharged on oral lower dose of Toujeo with outpatient follow-up on Monday or Tuesday by his primary care physician.  2.  Essential hypertension: Continue Norvasc and HCTZ  3.  Hyperlipidemia: Continue statin  DISCHARGE CONDITIONS AND DIET:   Stable for discharge on diabetic diet  CONSULTS OBTAINED:    DRUG ALLERGIES:   Allergies  Allergen Reactions  . Jardiance [Empagliflozin] Other (See Comments)    DKA     DISCHARGE MEDICATIONS:   Allergies as of 12/02/2018      Reactions   Jardiance [empagliflozin] Other (See Comments)   DKA       Medication List    STOP taking these medications    JARDIANCE 25 MG Tabs tablet Generic drug:  empagliflozin     TAKE these medications   ALPRAZolam 1 MG tablet Commonly known as:  XANAX Take 1 mg by mouth every 6 (six) hours as needed for anxiety. for anxiety   amLODipine 10 MG tablet Commonly known as:  NORVASC Take 10 mg by mouth daily.   aspirin EC 81 MG tablet Take 81 mg by mouth daily.   atorvastatin 20 MG tablet Commonly known as:  LIPITOR Take 20 mg by mouth daily.   Insulin Glargine 300 UNIT/ML Sopn Inject 17 Units into the skin every evening. What changed:  how much to take   losartan 100 MG tablet Commonly known as:  COZAAR Take 100 mg by mouth daily.   MULTIVITAMIN ADULTS 50+ Tabs Take 1 tablet by mouth daily.   NEXIUM 24HR 20 MG Tbec Generic drug:  Esomeprazole Magnesium Take 20 mg by mouth daily.   protein supplement shake Liqd Commonly known as:  PREMIER PROTEIN Take 325 mLs (11 oz total) by mouth daily.         Today   CHIEF COMPLAINT:  No acute issues overnight   VITAL SIGNS:  Blood pressure (!) 159/87, pulse 78, temperature 98.7 F (37.1 C), temperature source Oral, resp. rate 18,  height 6\' 1"  (1.854 m), weight 100 kg, SpO2 100 %.   REVIEW OF SYSTEMS:  Review of Systems  Constitutional: Negative.  Negative for chills, fever and malaise/fatigue.  HENT: Negative.  Negative for ear discharge, ear pain, hearing loss, nosebleeds and sore throat.   Eyes: Negative.  Negative for blurred vision and pain.  Respiratory: Negative.  Negative for cough, hemoptysis, shortness of breath and wheezing.   Cardiovascular: Negative.  Negative for chest pain, palpitations and leg swelling.  Gastrointestinal: Negative.  Negative for abdominal pain, blood in stool, diarrhea, nausea and vomiting.  Genitourinary: Negative.  Negative for dysuria.  Musculoskeletal: Negative.  Negative for back pain.  Skin: Negative.   Neurological: Negative for dizziness, tremors, speech change, focal weakness, seizures and  headaches.  Endo/Heme/Allergies: Negative.  Does not bruise/bleed easily.  Psychiatric/Behavioral: Negative.  Negative for depression, hallucinations and suicidal ideas.     PHYSICAL EXAMINATION:  GENERAL:  58 y.o.-year-old patient lying in the bed with no acute distress.  NECK:  Supple, no jugular venous distention. No thyroid enlargement, no tenderness.  LUNGS: Normal breath sounds bilaterally, no wheezing, rales,rhonchi  No use of accessory muscles of respiration.  CARDIOVASCULAR: S1, S2 normal. 2/6 murmurs, rubs, or gallops.  ABDOMEN: Soft, non-tender, non-distended. Bowel sounds present. No organomegaly or mass.  EXTREMITIES: No pedal edema, cyanosis, or clubbing.  PSYCHIATRIC: The patient is alert and oriented x 3.  SKIN: No obvious rash, lesion, or ulcer.   DATA REVIEW:   CBC Recent Labs  Lab 11/30/18 0600  WBC 4.7  HGB 12.2*  HCT 34.4*  PLT 148*    Chemistries  Recent Labs  Lab 12/02/18 0406  NA 135  K 3.6  CL 103  CO2 21*  GLUCOSE 190*  BUN 13  CREATININE 0.81  CALCIUM 8.4*  MG 2.2    Cardiac Enzymes Recent Labs  Lab 11/29/18 1410  TROPONINI <0.03    Microbiology Results  @MICRORSLT48 @  RADIOLOGY:  No results found.    Allergies as of 12/02/2018      Reactions   Jardiance [empagliflozin] Other (See Comments)   DKA       Medication List    STOP taking these medications   JARDIANCE 25 MG Tabs tablet Generic drug:  empagliflozin     TAKE these medications   ALPRAZolam 1 MG tablet Commonly known as:  XANAX Take 1 mg by mouth every 6 (six) hours as needed for anxiety. for anxiety   amLODipine 10 MG tablet Commonly known as:  NORVASC Take 10 mg by mouth daily.   aspirin EC 81 MG tablet Take 81 mg by mouth daily.   atorvastatin 20 MG tablet Commonly known as:  LIPITOR Take 20 mg by mouth daily.   Insulin Glargine 300 UNIT/ML Sopn Inject 17 Units into the skin every evening. What changed:  how much to take   losartan 100 MG  tablet Commonly known as:  COZAAR Take 100 mg by mouth daily.   MULTIVITAMIN ADULTS 50+ Tabs Take 1 tablet by mouth daily.   NEXIUM 24HR 20 MG Tbec Generic drug:  Esomeprazole Magnesium Take 20 mg by mouth daily.   protein supplement shake Liqd Commonly known as:  PREMIER PROTEIN Take 325 mLs (11 oz total) by mouth daily.         Management plans discussed with the patient and he is in agreement. Stable for discharge home  Patient should follow up with pcp  CODE STATUS:     Code Status Orders  (From  admission, onward)         Start     Ordered   11/29/18 1818  Full code  Continuous     11/29/18 1818        Code Status History    This patient has a current code status but no historical code status.      TOTAL TIME TAKING CARE OF THIS PATIENT: 38 minutes.    Note: This dictation was prepared with Dragon dictation along with smaller phrase technology. Any transcriptional errors that result from this process are unintentional.  Johari Pinney M.D on 12/02/2018 at 11:38 AM  Between 7am to 6pm - Pager - [phone] After 6pm go to [email or web address] - password [email or web address] [facility]  Office  [phone]  CC: Primary care physician; Philmore Pali, NP

## 2018-12-02 NOTE — Progress Notes (Signed)
Pharmacy Electrolyte Monitoring Consult:  Pharmacy consulted to assist in monitoring and replacing electrolytes in this 58 y.o. male admitted on 11/29/2018 with DKA.   Labs:  Sodium (mmol/L)  Date Value  12/02/2018 135   Potassium (mmol/L)  Date Value  12/02/2018 3.6   Magnesium (mg/dL)  Date Value  12/02/2018 2.2   Calcium (mg/dL)  Date Value  12/02/2018 8.4 (L)   Albumin (g/dL)  Date Value  10/07/2017 3.6    Assessment/Plan: Will order potassium 60mEq PO x 1 and obtain BMP with am labs.   Pharmacy will continue to monitor and adjust per consult.   Zachory Mangual L 12/02/2018 8:01 AM

## 2018-12-06 ENCOUNTER — Telehealth: Payer: Self-pay | Admitting: Pharmacy Technician

## 2018-12-06 NOTE — Telephone Encounter (Signed)
Patient is not an [facility] resident.  Is seeing Dr. [name] at [facility]  Patient discharged from [facility] with scripts.  [facility] provided a 30 day supply.  Dub Mikes, nurse at [facility] stated that pt has an appointment at their clinic on 12/11/18.  Will complete Toujeo PAP Application when patient presents to appointment.  Contacted pt to make aware.  Also, provided pt will information about [employer].    [facility]

## 2019-01-13 ENCOUNTER — Other Ambulatory Visit: Payer: Self-pay | Admitting: Nurse Practitioner

## 2019-01-13 ENCOUNTER — Encounter: Payer: Self-pay | Admitting: Hematology and Oncology

## 2020-11-02 DIAGNOSIS — R9431 Abnormal electrocardiogram [ECG] [EKG]: Secondary | ICD-10-CM

## 2020-11-02 DIAGNOSIS — I214 Non-ST elevation (NSTEMI) myocardial infarction: Secondary | ICD-10-CM

## 2020-11-02 DIAGNOSIS — E1110 Type 2 diabetes mellitus with ketoacidosis without coma: Secondary | ICD-10-CM

## 2020-11-02 DIAGNOSIS — F1010 Alcohol abuse, uncomplicated: Secondary | ICD-10-CM

## 2020-11-02 DIAGNOSIS — R0600 Dyspnea, unspecified: Secondary | ICD-10-CM

## 2020-11-02 DIAGNOSIS — N179 Acute kidney failure, unspecified: Secondary | ICD-10-CM

## 2020-11-03 DIAGNOSIS — E878 Other disorders of electrolyte and fluid balance, not elsewhere classified: Secondary | ICD-10-CM

## 2020-11-03 DIAGNOSIS — I10 Essential (primary) hypertension: Secondary | ICD-10-CM

## 2020-11-04 ENCOUNTER — Other Ambulatory Visit: Payer: Self-pay

## 2020-11-04 ENCOUNTER — Encounter (HOSPITAL_COMMUNITY)
Admission: AD | Disposition: A | Discharge status: Home / Self Care | Payer: Self-pay | Source: Other Acute Inpatient Hospital | Attending: Cardiovascular Disease

## 2020-11-04 ENCOUNTER — Encounter (HOSPITAL_COMMUNITY): Payer: Self-pay | Admitting: Interventional Cardiology

## 2020-11-04 ENCOUNTER — Inpatient Hospital Stay (HOSPITAL_COMMUNITY)
Admission: AD | Admit: 2020-11-04 | Discharge: 2020-11-05 | DRG: 246 | Disposition: A | Discharge status: Home / Self Care | Payer: Self-pay | Source: Other Acute Inpatient Hospital | Attending: Cardiovascular Disease | Admitting: Cardiovascular Disease

## 2020-11-04 DIAGNOSIS — E876 Hypokalemia: Secondary | ICD-10-CM | POA: Diagnosis present

## 2020-11-04 DIAGNOSIS — N179 Acute kidney failure, unspecified: Secondary | ICD-10-CM | POA: Diagnosis present

## 2020-11-04 DIAGNOSIS — Z8547 Personal history of malignant neoplasm of testis: Secondary | ICD-10-CM

## 2020-11-04 DIAGNOSIS — I249 Acute ischemic heart disease, unspecified: Secondary | ICD-10-CM

## 2020-11-04 DIAGNOSIS — E1110 Type 2 diabetes mellitus with ketoacidosis without coma: Secondary | ICD-10-CM | POA: Diagnosis present

## 2020-11-04 DIAGNOSIS — Z8249 Family history of ischemic heart disease and other diseases of the circulatory system: Secondary | ICD-10-CM

## 2020-11-04 DIAGNOSIS — Z79899 Other long term (current) drug therapy: Secondary | ICD-10-CM

## 2020-11-04 DIAGNOSIS — Z794 Long term (current) use of insulin: Secondary | ICD-10-CM

## 2020-11-04 DIAGNOSIS — F419 Anxiety disorder, unspecified: Secondary | ICD-10-CM | POA: Diagnosis present

## 2020-11-04 DIAGNOSIS — Z955 Presence of coronary angioplasty implant and graft: Secondary | ICD-10-CM

## 2020-11-04 DIAGNOSIS — I214 Non-ST elevation (NSTEMI) myocardial infarction: Principal | ICD-10-CM | POA: Diagnosis present

## 2020-11-04 DIAGNOSIS — I10 Essential (primary) hypertension: Secondary | ICD-10-CM

## 2020-11-04 DIAGNOSIS — G4730 Sleep apnea, unspecified: Secondary | ICD-10-CM | POA: Diagnosis present

## 2020-11-04 DIAGNOSIS — Z833 Family history of diabetes mellitus: Secondary | ICD-10-CM

## 2020-11-04 DIAGNOSIS — Z87442 Personal history of urinary calculi: Secondary | ICD-10-CM

## 2020-11-04 DIAGNOSIS — Z7982 Long term (current) use of aspirin: Secondary | ICD-10-CM

## 2020-11-04 DIAGNOSIS — E860 Dehydration: Secondary | ICD-10-CM | POA: Diagnosis present

## 2020-11-04 DIAGNOSIS — E785 Hyperlipidemia, unspecified: Secondary | ICD-10-CM

## 2020-11-04 DIAGNOSIS — I2510 Atherosclerotic heart disease of native coronary artery without angina pectoris: Secondary | ICD-10-CM

## 2020-11-04 HISTORY — PX: INTRAVASCULAR ULTRASOUND/IVUS: CATH118244

## 2020-11-04 HISTORY — DX: Atherosclerotic heart disease of native coronary artery without angina pectoris: I25.10

## 2020-11-04 HISTORY — DX: Other specified health status: Z78.9

## 2020-11-04 HISTORY — PX: RIGHT/LEFT HEART CATH AND CORONARY ANGIOGRAPHY: CATH118266

## 2020-11-04 HISTORY — PX: CORONARY STENT INTERVENTION: CATH118234

## 2020-11-04 LAB — POCT I-STAT 7, (LYTES, BLD GAS, ICA,H+H)
Acid-base deficit: 2 mmol/L (ref 0.0–2.0)
Bicarbonate: 22.7 mmol/L (ref 20.0–28.0)
Calcium, Ion: 1.21 mmol/L (ref 1.15–1.40)
HCT: 37 % — ABNORMAL LOW (ref 39.0–52.0)
Hemoglobin: 12.6 g/dL — ABNORMAL LOW (ref 13.0–17.0)
O2 Saturation: 99 %
Potassium: 3.7 mmol/L (ref 3.5–5.1)
Sodium: 138 mmol/L (ref 135–145)
TCO2: 24 mmol/L (ref 22–32)
pCO2 arterial: 36 mmHg (ref 32.0–48.0)
pH, Arterial: 7.409 (ref 7.350–7.450)
pO2, Arterial: 136 mmHg — ABNORMAL HIGH (ref 83.0–108.0)

## 2020-11-04 LAB — GLUCOSE, CAPILLARY
Glucose-Capillary: 183 mg/dL — ABNORMAL HIGH (ref 70–99)
Glucose-Capillary: 306 mg/dL — ABNORMAL HIGH (ref 70–99)

## 2020-11-04 LAB — POCT I-STAT EG7
Acid-base deficit: 1 mmol/L (ref 0.0–2.0)
Bicarbonate: 23.6 mmol/L (ref 20.0–28.0)
Calcium, Ion: 1.22 mmol/L (ref 1.15–1.40)
HCT: 36 % — ABNORMAL LOW (ref 39.0–52.0)
Hemoglobin: 12.2 g/dL — ABNORMAL LOW (ref 13.0–17.0)
O2 Saturation: 68 %
Potassium: 3.7 mmol/L (ref 3.5–5.1)
Sodium: 138 mmol/L (ref 135–145)
TCO2: 25 mmol/L (ref 22–32)
pCO2, Ven: 39.6 mmHg — ABNORMAL LOW (ref 44.0–60.0)
pH, Ven: 7.384 (ref 7.250–7.430)
pO2, Ven: 36 mmHg (ref 32.0–45.0)

## 2020-11-04 LAB — POCT ACTIVATED CLOTTING TIME
Activated Clotting Time: 273 seconds
Activated Clotting Time: 315 seconds

## 2020-11-04 SURGERY — RIGHT/LEFT HEART CATH AND CORONARY ANGIOGRAPHY
Anesthesia: LOCAL

## 2020-11-04 MED ORDER — HEPARIN SODIUM (PORCINE) 1000 UNIT/ML IJ SOLN
INTRAMUSCULAR | Status: AC
  Filled 2020-11-04: qty 1

## 2020-11-04 MED ORDER — INSULIN ASPART 100 UNIT/ML ~~LOC~~ SOLN
0.0000 [IU] | Freq: Three times a day (TID) | SUBCUTANEOUS | Status: DC
  Administered 2020-11-05: 8 [IU] via SUBCUTANEOUS
  Administered 2020-11-05: 5 [IU] via SUBCUTANEOUS

## 2020-11-04 MED ORDER — TICAGRELOR 90 MG PO TABS
ORAL_TABLET | ORAL | Status: DC | PRN
  Administered 2020-11-04: 180 mg via ORAL

## 2020-11-04 MED ORDER — TICAGRELOR 90 MG PO TABS
90.0000 mg | ORAL_TABLET | Freq: Two times a day (BID) | ORAL | Status: DC
  Administered 2020-11-05 (×2): 90 mg via ORAL
  Filled 2020-11-04 (×2): qty 1

## 2020-11-04 MED ORDER — LIDOCAINE HCL (PF) 1 % IJ SOLN
INTRAMUSCULAR | Status: AC
  Filled 2020-11-04: qty 30

## 2020-11-04 MED ORDER — ADULT MULTIVITAMIN W/MINERALS CH
1.0000 | ORAL_TABLET | Freq: Every day | ORAL | Status: DC
  Administered 2020-11-04 – 2020-11-05 (×2): 1 via ORAL
  Filled 2020-11-04 (×3): qty 1

## 2020-11-04 MED ORDER — MIDAZOLAM HCL 2 MG/2ML IJ SOLN
INTRAMUSCULAR | Status: AC
  Filled 2020-11-04: qty 2

## 2020-11-04 MED ORDER — TICAGRELOR 90 MG PO TABS
ORAL_TABLET | ORAL | Status: AC
  Filled 2020-11-04: qty 2

## 2020-11-04 MED ORDER — HYDRALAZINE HCL 20 MG/ML IJ SOLN: 10.0000 mg | INTRAMUSCULAR | Status: AC | PRN

## 2020-11-04 MED ORDER — ALPRAZOLAM 0.5 MG PO TABS: 1.0000 mg | ORAL_TABLET | Freq: Four times a day (QID) | ORAL | Status: DC | PRN

## 2020-11-04 MED ORDER — LABETALOL HCL 5 MG/ML IV SOLN: 10.0000 mg | INTRAVENOUS | Status: AC | PRN

## 2020-11-04 MED ORDER — FENTANYL CITRATE (PF) 100 MCG/2ML IJ SOLN
INTRAMUSCULAR | Status: DC | PRN
  Administered 2020-11-04 (×2): 25 ug via INTRAVENOUS

## 2020-11-04 MED ORDER — INSULIN ASPART 100 UNIT/ML ~~LOC~~ SOLN
10.0000 [IU] | Freq: Once | SUBCUTANEOUS | Status: AC
  Administered 2020-11-04: 10 [IU] via SUBCUTANEOUS

## 2020-11-04 MED ORDER — PANTOPRAZOLE SODIUM 20 MG PO TBEC
20.0000 mg | DELAYED_RELEASE_TABLET | Freq: Every day | ORAL | Status: DC
  Administered 2020-11-04 – 2020-11-05 (×2): 20 mg via ORAL
  Filled 2020-11-04 (×3): qty 1

## 2020-11-04 MED ORDER — LOSARTAN POTASSIUM 50 MG PO TABS
100.0000 mg | ORAL_TABLET | Freq: Every day | ORAL | Status: DC
  Administered 2020-11-04 – 2020-11-05 (×2): 100 mg via ORAL
  Filled 2020-11-04 (×2): qty 2

## 2020-11-04 MED ORDER — HEPARIN SODIUM (PORCINE) 1000 UNIT/ML IJ SOLN
INTRAMUSCULAR | Status: DC | PRN
  Administered 2020-11-04: 8000 [IU] via INTRAVENOUS
  Administered 2020-11-04: 4000 [IU] via INTRAVENOUS
  Administered 2020-11-04: 3000 [IU] via INTRAVENOUS

## 2020-11-04 MED ORDER — LIDOCAINE HCL (PF) 1 % IJ SOLN
INTRAMUSCULAR | Status: DC | PRN
  Administered 2020-11-04: 2 mL
  Administered 2020-11-04: 5 mL

## 2020-11-04 MED ORDER — INSULIN ASPART 100 UNIT/ML ~~LOC~~ SOLN: 0.0000 [IU] | Freq: Every day | SUBCUTANEOUS | Status: DC

## 2020-11-04 MED ORDER — IOHEXOL 350 MG/ML SOLN
INTRAVENOUS | Status: DC | PRN
  Administered 2020-11-04: 150 mL via INTRA_ARTERIAL

## 2020-11-04 MED ORDER — FENTANYL CITRATE (PF) 100 MCG/2ML IJ SOLN
INTRAMUSCULAR | Status: AC
  Filled 2020-11-04: qty 2

## 2020-11-04 MED ORDER — ATORVASTATIN CALCIUM 10 MG PO TABS
20.0000 mg | ORAL_TABLET | Freq: Every day | ORAL | Status: DC
  Administered 2020-11-04: 20 mg via ORAL
  Filled 2020-11-04: qty 2

## 2020-11-04 MED ORDER — INSULIN GLARGINE 100 UNIT/ML ~~LOC~~ SOLN
17.0000 [IU] | Freq: Every evening | SUBCUTANEOUS | Status: DC
  Administered 2020-11-04: 17 [IU] via SUBCUTANEOUS
  Filled 2020-11-04 (×2): qty 0.17

## 2020-11-04 MED ORDER — SODIUM CHLORIDE 0.9 % IV SOLN: 250.0000 mL | INTRAVENOUS | Status: DC | PRN

## 2020-11-04 MED ORDER — AMLODIPINE BESYLATE 10 MG PO TABS
10.0000 mg | ORAL_TABLET | Freq: Every day | ORAL | Status: DC
  Administered 2020-11-04 – 2020-11-05 (×2): 10 mg via ORAL
  Filled 2020-11-04 (×2): qty 1

## 2020-11-04 MED ORDER — NITROGLYCERIN 1 MG/10 ML FOR IR/CATH LAB
INTRA_ARTERIAL | Status: DC | PRN
  Administered 2020-11-04 (×2): 200 ug via INTRACORONARY
  Administered 2020-11-04: 200 ug via INTRA_ARTERIAL

## 2020-11-04 MED ORDER — SODIUM CHLORIDE 0.9 % IV SOLN: INTRAVENOUS | Status: AC

## 2020-11-04 MED ORDER — MIDAZOLAM HCL 2 MG/2ML IJ SOLN
INTRAMUSCULAR | Status: DC | PRN
  Administered 2020-11-04: 1 mg via INTRAVENOUS
  Administered 2020-11-04: 2 mg via INTRAVENOUS

## 2020-11-04 MED ORDER — ACETAMINOPHEN 325 MG PO TABS: 650.0000 mg | ORAL_TABLET | ORAL | Status: DC | PRN

## 2020-11-04 MED ORDER — VERAPAMIL HCL 2.5 MG/ML IV SOLN
INTRAVENOUS | Status: DC | PRN
  Administered 2020-11-04: 10 mL via INTRA_ARTERIAL

## 2020-11-04 MED ORDER — VERAPAMIL HCL 2.5 MG/ML IV SOLN
INTRAVENOUS | Status: AC
  Filled 2020-11-04: qty 2

## 2020-11-04 MED ORDER — ASPIRIN EC 81 MG PO TBEC
81.0000 mg | DELAYED_RELEASE_TABLET | Freq: Every day | ORAL | Status: DC
  Administered 2020-11-05: 81 mg via ORAL
  Filled 2020-11-04 (×2): qty 1

## 2020-11-04 MED ORDER — ONDANSETRON HCL 4 MG/2ML IJ SOLN: 4.0000 mg | Freq: Four times a day (QID) | INTRAMUSCULAR | Status: DC | PRN

## 2020-11-04 MED ORDER — HEPARIN (PORCINE) IN NACL 1000-0.9 UT/500ML-% IV SOLN
INTRAVENOUS | Status: AC
  Filled 2020-11-04: qty 500

## 2020-11-04 MED ORDER — SODIUM CHLORIDE 0.9% FLUSH: 3.0000 mL | INTRAVENOUS | Status: DC | PRN

## 2020-11-04 MED ORDER — ASPIRIN 81 MG PO CHEW: 81.0000 mg | CHEWABLE_TABLET | Freq: Every day | ORAL | Status: DC

## 2020-11-04 MED ORDER — SODIUM CHLORIDE 0.9% FLUSH
3.0000 mL | Freq: Two times a day (BID) | INTRAVENOUS | Status: DC
  Administered 2020-11-05: 3 mL via INTRAVENOUS

## 2020-11-04 SURGICAL SUPPLY — 26 items
BALLN SAPPHIRE 2.0X15 (BALLOONS) ×2
BALLN SAPPHIRE ~~LOC~~ 2.5X12 (BALLOONS) ×1 IMPLANT
BALLN ~~LOC~~ EMERGE MR 3.5X6 (BALLOONS) ×2
BALLOON SAPPHIRE 2.0X15 (BALLOONS) IMPLANT
BALLOON ~~LOC~~ EMERGE MR 3.5X6 (BALLOONS) IMPLANT
CATH 5FR JL3.5 JR4 ANG PIG MP (CATHETERS) ×1 IMPLANT
CATH BALLN WEDGE 5F 110CM (CATHETERS) ×1 IMPLANT
CATH LAUNCHER 6FR EBU 3.75 (CATHETERS) ×1 IMPLANT
CATH OPTICROSS HD (CATHETERS) ×1 IMPLANT
DEVICE RAD COMP TR BAND LRG (VASCULAR PRODUCTS) ×1 IMPLANT
GLIDESHEATH SLEND SS 6F .021 (SHEATH) ×1 IMPLANT
GUIDEWIRE INQWIRE 1.5J.035X260 (WIRE) IMPLANT
INQWIRE 1.5J .035X260CM (WIRE) ×2
KIT ENCORE 26 ADVANTAGE (KITS) ×1 IMPLANT
KIT HEART LEFT (KITS) ×2 IMPLANT
KIT HEMO VALVE WATCHDOG (MISCELLANEOUS) ×1 IMPLANT
PACK CARDIAC CATHETERIZATION (CUSTOM PROCEDURE TRAY) ×2 IMPLANT
SHEATH GLIDE SLENDER 4/5FR (SHEATH) ×1 IMPLANT
SHEATH PROBE COVER 6X72 (BAG) ×1 IMPLANT
SLED PULL BACK IVUS (MISCELLANEOUS) ×1 IMPLANT
STENT RESOLUTE ONYX 2.25X15 (Permanent Stent) ×1 IMPLANT
STENT RESOLUTE ONYX 3.0X8 (Permanent Stent) ×1 IMPLANT
TRANSDUCER W/STOPCOCK (MISCELLANEOUS) ×2 IMPLANT
TUBING CIL FLEX 10 FLL-RA (TUBING) ×2 IMPLANT
WIRE ASAHI PROWATER 180CM (WIRE) ×1 IMPLANT
WIRE HI TORQ BMW 190CM (WIRE) ×1 IMPLANT

## 2020-11-04 NOTE — H&P (Addendum)
Cardiology Admission History and Physical:   Patient ID: Nathan Calderon MRN: [id number]; DOB: 1961-04-10   Admission date: 11/04/2020  Primary Care Provider: Philmore Pali, NP [facility] Cardiologist: Berniece Salines, DO  [place] Electrophysiologist:  None   Chief Complaint:  Abd pain, n/v, NSTEMI  Patient Profile:   Nathan Calderon is a 59 y.o. male with PMH of IDDM, HTN, HLD, testicular Ca and ETOH use who presented to [facility] with abd pain, nausea/vomiting and found to be in DKA. Seen by Dr. Harriet Masson and transferred to [facility] for management of NSTEMI.   History of Present Illness:   Nathan Calderon is a 59 yo male with PMH noted above. Presented to [facility] on 12/4 with complaints of nausea/vomiting, abd pain. Reported he had been in a program where he was receiving his insulin for free but program ended and he ran out of meds. In the ED he was noted to be in severe DKA with dehydration and ARF. He was started on an insulin drip and IV fluids. Troponin I was checked and peaked at 2.74. EKG showed SST with TWI in anterolateral leads. Seen by Dr. Harriet Masson with recommendations for transfer to [facility] with cardiac cath. Echo there showed EF of 60-65% with hypokinesis of the basal inferior/septal wall, along with impaired relaxation. Was able to transition off IV insulin as anion gap closed prior to transfer.    Past Medical History:  Diagnosis Date  . Angina    2006, had cardiac cath at the time  . Anxiety   . Diabetes mellitus    Type 2  . Headache(784.0)   . Heart murmur    as child  . Hyperlipidemia   . Hypertension   . Kidney stone    hx of  . Motion sickness    ocean boats  . Sleep apnea    no CPAP.  Never got it.  . Testicular cancer ([place]) 04/08/2016    Past Surgical History:  Procedure Laterality Date  . CARDIAC CATHETERIZATION  2000   and 2006  . COLONOSCOPY WITH PROPOFOL N/A 05/12/2017   Procedure: COLONOSCOPY WITH PROPOFOL;  Surgeon: Lucilla Lame, MD;  Location:  [place];  Service: Endoscopy;  Laterality: N/A;  Cannot arrive before 8:45 Diabetic - insulin and oral meds sleep apnea  . EYE SURGERY     x 2  . HERNIA REPAIR     with undecended testicle  . LASIK    . ORCHIECTOMY Right 04/08/2016   Procedure: ORCHIECTOMY/ INGUINAL APPROACH;  Surgeon: [name], MD;  Location: [facility];  Service: Urology;  Laterality: Right;  . PARS PLANA VITRECTOMY  04/04/2012   Procedure: PARS PLANA VITRECTOMY WITH 25 GAUGE;  Surgeon: [name], MD;  Location: [place];  Service: Ophthalmology;  Laterality: Left;  with laser  . POLYPECTOMY  05/12/2017   Procedure: POLYPECTOMY;  Surgeon: Lucilla Lame, MD;  Location: [place];  Service: Endoscopy;;  . SHOULDER SURGERY     left shoulder  . VASECTOMY Left 04/08/2016   Procedure: VASECTOMY;  Surgeon: [name], MD;  Location: [facility];  Service: Urology;  Laterality: Left;     Medications Prior to Admission: Prior to Admission medications   Medication Sig Start Date End Date Taking? Authorizing Provider  ALPRAZolam Duanne Moron) 1 MG tablet Take 1 mg by mouth every 6 (six) hours as needed for anxiety. for anxiety 04/18/17   [provider]  amLODipine (NORVASC) 10 MG tablet Take 10 mg by  mouth daily. 03/05/16   [provider]  aspirin EC 81 MG tablet Take 81 mg by mouth daily.    [provider]  atorvastatin (LIPITOR) 20 MG tablet Take 20 mg by mouth daily.    [provider]  Esomeprazole Magnesium (NEXIUM 24HR) 20 MG TBEC Take 20 mg by mouth daily.    [provider]  Insulin Glargine 300 UNIT/ML SOPN Inject 17 Units into the skin every evening. 12/02/18 01/02/19  Bettey Costa, MD  losartan (COZAAR) 100 MG tablet Take 100 mg by mouth daily.    [provider]  Multiple Vitamins-Minerals (MULTIVITAMIN ADULTS 50+) TABS Take 1 tablet by mouth daily.    [provider]     Allergies:    Allergies  Allergen Reactions  . Jardiance  [Empagliflozin] Other (See Comments)    DKA     Social History:   Social History   Socioeconomic History  . Marital status: Divorced    Spouse name: Not on file  . Number of children: Not on file  . Years of education: Not on file  . Highest education level: Not on file  Occupational History  . Not on file  Tobacco Use  . Smoking status: Never Smoker  . Smokeless tobacco: Never Used  Substance and Sexual Activity  . Alcohol use: Yes    Alcohol/week: 24.0 standard drinks    Types: 24 Cans of beer per week    Comment: 3-4 beers a day ( "not every day") 2- 3 times a week  . Drug use: No  . Sexual activity: Yes  Other Topics Concern  . Not on file  Social History Narrative  . Not on file   Social Determinants of Health   Financial Resource Strain:   . Difficulty of Paying Living Expenses: Not on file  Food Insecurity:   . Worried About Charity fundraiser in the Last Year: Not on file  . Ran Out of Food in the Last Year: Not on file  Transportation Needs:   . Lack of Transportation (Medical): Not on file  . Lack of Transportation (Non-Medical): Not on file  Physical Activity:   . Days of Exercise per Week: Not on file  . Minutes of Exercise per Session: Not on file  Stress:   . Feeling of Stress : Not on file  Social Connections:   . Frequency of Communication with Friends and Family: Not on file  . Frequency of Social Gatherings with Friends and Family: Not on file  . Attends Religious Services: Not on file  . Active Member of Clubs or Organizations: Not on file  . Attends Archivist Meetings: Not on file  . Marital Status: Not on file  Intimate Partner Violence:   . Fear of Current or Ex-Partner: Not on file  . Emotionally Abused: Not on file  . Physically Abused: Not on file  . Sexually Abused: Not on file    Family History:   The patient's family history includes Diabetes in his mother; Heart disease in his father and mother; Hypertension in his  mother; Ovarian cancer in his maternal grandmother. There is no history of Kidney disease or Prostate cancer.    ROS:  Please see the history of present illness.  All other ROS reviewed and negative.     Physical Exam/Data:  There were no vitals filed for this visit. No intake or output data in the 24 hours ending 11/04/20 1455 Last 3 Weights 11/29/2018 11/29/2018 10/07/2017  Weight (lbs) 220 lb 7.4 oz 220 lb 251 lb 11.2 oz  Weight (kg) 100 kg 99.791 kg 114.17 kg     There is no height or weight on file to calculate BMI.  General:  Well nourished, well developed, in no acute distress HEENT: normal Lymph: no adenopathy Neck: no JVD Endocrine:  No thryomegaly Vascular: No carotid bruits; FA pulses 2+ bilaterally without bruits  Cardiac:  normal S1, S2; RRR; no murmur  Lungs:  clear to auscultation bilaterally, no wheezing, rhonchi or rales  Abd: soft, nontender, no hepatomegaly  Ext: no edema Musculoskeletal:  No deformities, BUE and BLE strength normal and equal Skin: warm and dry  Neuro:  CNs 2-12 intact, no focal abnormalities noted Psych:  Normal affect    EKG:  The ECG that was done 12/5 was personally reviewed and demonstrates ST with TWI anterolateral leads  Relevant CV Studies:  Echo ( done at [facility])  Laboratory Data:  High Sensitivity Troponin:  No results for input(s): TROPONINIHS in the last 720 hours.    ChemistryNo results for input(s): NA, K, CL, CO2, GLUCOSE, BUN, CREATININE, CALCIUM, GFRNONAA, GFRAA, ANIONGAP in the last 168 hours.  No results for input(s): PROT, ALBUMIN, AST, ALT, ALKPHOS, BILITOT in the last 168 hours. HematologyNo results for input(s): WBC, RBC, HGB, HCT, MCV, MCH, MCHC, RDW, PLT in the last 168 hours. BNPNo results for input(s): BNP, PROBNP in the last 168 hours.  DDimer No results for input(s): DDIMER in the last 168 hours.   Radiology/Studies:  No results found.   Assessment and Plan:   Nathan Calderon is a 59 y.o. male with PMH of  IDDM, HTN, HLD, testicular Ca  and ETOH use who presented to [facility] with abd pain, nausea/vomiting and found to be in DKA. Seen by Dr. Harriet Masson and transferred to [facility] for management of NSTEMI.   1. NSTEMI: Trop I peaked at 2.74 while at [facility]. EKG showed ST with TWI in anterolateral leads. He is now transferred to [facility] with plan for cardiac cath.  -- on ASA, statin -- The patient understands that risks included but are not limited to stroke (1 in 1000), death (1 in 1000), kidney failure [usually temporary] (1 in 500), bleeding (1 in 200), allergic reaction [possibly serious] (1 in 200).   2. IDDM with DKA: reports he had been a program receiving his insulin for free. When program ended he has not been able to get meds. CBG >700 at [place]. Treated with IV insulin with improvement prior to transfer. Now transitioned to subq insulin. Metformin held with plans for cath  3. HLD: plan for high dose statin  4. HTN: Will need to review patient's home medication regimen post cath.  5. Hypokalemia: K+ 2.7 prior to transfer.  :[id number]} TIMI Risk Score for Unstable Angina or Non-ST Elevation MI:   The patient's TIMI risk score is 3, which indicates a 13% risk of all cause mortality, new or recurrent myocardial infarction or need for urgent revascularization in the next 14 days.  Severity of Illness: The appropriate patient status for this patient is OBSERVATION. Observation status is judged to be reasonable and necessary in order to provide the required intensity of service to ensure the patient's safety. The patient's presenting symptoms, physical exam findings, and initial radiographic and laboratory data in the context of their medical condition is felt to place them at decreased risk for further clinical deterioration. Furthermore, it is anticipated that the patient will be medically stable for  discharge from the hospital within 2 midnights of admission. The following factors support the  patient status of observation.   " The patient's presenting symptoms include abd pain, n/v. " The physical exam findings include stable exam. " The initial radiographic and laboratory data are elevated troponin, DKA (at [facility]).   For questions or updates, please contact [place] Please consult [email or web address] for contact info under     Signed, Reino Bellis, NP  11/04/2020 2:55 PM   I have examined the patient and reviewed assessment and plan and discussed with patient.  Agree with above as stated.    Patient with NSTEMI in the setting of DKA.  Abnormal ECG and elevated troponin.  All questions about cath answered.    Plan right radial approach.  Larae Grooms

## 2020-11-04 NOTE — Interval H&P Note (Signed)
Cath Lab Visit (complete for each Cath Lab visit)  Clinical Evaluation Leading to the Procedure:   ACS: Yes.    Non-ACS:    Anginal Classification: CCS IV  Anti-ischemic medical therapy: Minimal Therapy (1 class of medications)  Non-Invasive Test Results: No non-invasive testing performed  Prior CABG: No previous CABG      History and Physical Interval Note:  11/04/2020 3:55 PM  Nathan Calderon  has presented today for surgery, with the diagnosis of abnormal echo, shortness of breath.  The various methods of treatment have been discussed with the patient and family. After consideration of risks, benefits and other options for treatment, the patient has consented to  Procedure(s): RIGHT/LEFT HEART CATH AND CORONARY ANGIOGRAPHY (N/A) as a surgical intervention.  The patient's history has been reviewed, patient examined, no change in status, stable for surgery.  I have reviewed the patient's chart and labs.  Questions were answered to the patient's satisfaction.     Larae Grooms

## 2020-11-05 ENCOUNTER — Other Ambulatory Visit (HOSPITAL_COMMUNITY): Payer: Self-pay | Admitting: Cardiology

## 2020-11-05 ENCOUNTER — Encounter (HOSPITAL_COMMUNITY): Payer: Self-pay | Admitting: Interventional Cardiology

## 2020-11-05 DIAGNOSIS — I10 Essential (primary) hypertension: Secondary | ICD-10-CM

## 2020-11-05 DIAGNOSIS — E785 Hyperlipidemia, unspecified: Secondary | ICD-10-CM

## 2020-11-05 LAB — GLUCOSE, CAPILLARY
Glucose-Capillary: 245 mg/dL — ABNORMAL HIGH (ref 70–99)
Glucose-Capillary: 259 mg/dL — ABNORMAL HIGH (ref 70–99)

## 2020-11-05 LAB — BASIC METABOLIC PANEL
Anion gap: 13 (ref 5–15)
BUN: 11 mg/dL (ref 6–20)
CO2: 23 mmol/L (ref 22–32)
Calcium: 8.8 mg/dL — ABNORMAL LOW (ref 8.9–10.3)
Chloride: 99 mmol/L (ref 98–111)
Creatinine, Ser: 0.85 mg/dL (ref 0.61–1.24)
GFR, Estimated: >60 mL/min (ref >60)
Glucose, Bld: 269 mg/dL — ABNORMAL HIGH (ref 70–99)
Potassium: 3.2 mmol/L — ABNORMAL LOW (ref 3.5–5.1)
Sodium: 135 mmol/L (ref 135–145)

## 2020-11-05 LAB — CBC
HCT: 37.9 % — ABNORMAL LOW (ref 39.0–52.0)
Hemoglobin: 14.2 g/dL (ref 13.0–17.0)
MCH: 32.9 pg (ref 26.0–34.0)
MCHC: 37.5 g/dL — ABNORMAL HIGH (ref 30.0–36.0)
MCV: 87.7 fL (ref 80.0–100.0)
Platelets: 143 10*3/uL — ABNORMAL LOW (ref 150–400)
RBC: 4.32 MIL/uL (ref 4.22–5.81)
RDW: 11.9 % (ref 11.5–15.5)
WBC: 6.3 10*3/uL (ref 4.0–10.5)
nRBC: 0 % (ref 0.0–0.2)

## 2020-11-05 LAB — HEMOGLOBIN A1C
Hgb A1c MFr Bld: 11.1 % — ABNORMAL HIGH (ref 4.8–5.6)
Mean Plasma Glucose: 271.87 mg/dL

## 2020-11-05 MED ORDER — TICAGRELOR 90 MG PO TABS
90.0000 mg | ORAL_TABLET | Freq: Two times a day (BID) | ORAL | 2 refills | Status: DC
Start: 2020-11-05 — End: 2020-12-15

## 2020-11-05 MED ORDER — NITROGLYCERIN 0.4 MG SL SUBL: 0.4000 mg | SUBLINGUAL_TABLET | SUBLINGUAL | 2 refills | Status: DC | PRN

## 2020-11-05 MED ORDER — POTASSIUM CHLORIDE CRYS ER 20 MEQ PO TBCR
60.0000 meq | EXTENDED_RELEASE_TABLET | Freq: Once | ORAL | Status: AC
  Administered 2020-11-05: 60 meq via ORAL
  Filled 2020-11-05: qty 3

## 2020-11-05 MED ORDER — INSULIN GLARGINE 100 UNIT/ML SOLOSTAR PEN: 80.0000 [IU] | PEN_INJECTOR | Freq: Every day | SUBCUTANEOUS | 0 refills | Status: DC

## 2020-11-05 MED ORDER — METOPROLOL TARTRATE 25 MG PO TABS
25.0000 mg | ORAL_TABLET | Freq: Two times a day (BID) | ORAL | Status: DC
  Administered 2020-11-05: 25 mg via ORAL
  Filled 2020-11-05: qty 1

## 2020-11-05 MED ORDER — ATORVASTATIN CALCIUM 80 MG PO TABS
80.0000 mg | ORAL_TABLET | Freq: Every day | ORAL | 0 refills | Status: DC
Start: 2020-11-06 — End: 2020-11-06

## 2020-11-05 MED ORDER — METOPROLOL TARTRATE 25 MG PO TABS
25.0000 mg | ORAL_TABLET | Freq: Two times a day (BID) | ORAL | 0 refills | Status: DC
Start: 2020-11-05 — End: 2020-11-05

## 2020-11-05 MED ORDER — ATORVASTATIN CALCIUM 80 MG PO TABS
80.0000 mg | ORAL_TABLET | Freq: Every day | ORAL | Status: DC
  Administered 2020-11-05: 80 mg via ORAL
  Filled 2020-11-05: qty 1

## 2020-11-05 MED ORDER — ASPIRIN 81 MG PO TBEC
81.0000 mg | DELAYED_RELEASE_TABLET | Freq: Every day | ORAL | 1 refills | Status: DC
Start: 2020-11-06 — End: 2020-11-06

## 2020-11-05 MED ORDER — TOUJEO SOLOSTAR 300 UNIT/ML ~~LOC~~ SOPN: 80.0000 [IU] | PEN_INJECTOR | Freq: Every day | SUBCUTANEOUS | 1 refills | Status: DC

## 2020-11-05 MED FILL — METOPROLOL TARTRATE 25 MG T: 25 | 30 days supply | Qty: 60 | Fill #0

## 2020-11-05 MED FILL — ATORVASTATIN CALCIUM 80 MG: 80 | 30 days supply | Qty: 30 | Fill #0

## 2020-11-05 MED FILL — NITROGLYCERIN 0.4 MG TAB SL: 0.4 | 8 days supply | Qty: 25 | Fill #0

## 2020-11-05 MED FILL — ASPIRIN LOW DOSE 81 MG TBEC: 81 | 90 days supply | Qty: 90 | Fill #0

## 2020-11-05 MED FILL — LANTUS SOLOSTAR 100 UNITS/M: 100 | 18 days supply | Qty: 15 | Fill #0

## 2020-11-05 MED FILL — BRILINTA 90 MG TABLET: 90 | 30 days supply | Qty: 60 | Fill #0

## 2020-11-05 MED FILL — PENTIPS 32G X 4 MM MISC: 32G X 4 MM | 30 days supply | Qty: 100 | Fill #0

## 2020-11-05 NOTE — Discharge Summary (Signed)
Discharge Summary    Patient ID: Nathan Calderon MRN: [id number]; DOB: 1961/05/06  Admit date: 11/04/2020 Discharge date: 11/05/2020  Primary Care Provider: Philmore Pali, NP  Primary Cardiologist: Berniece Salines, DO  Primary Electrophysiologist:  None   Discharge Diagnoses    Principal Problem:   Non-ST elevation (NSTEMI) myocardial infarction [facility]) Active Problems:   DKA (diabetic ketoacidosis) ([place])   Hypertension   Hyperlipidemia  Diagnostic Studies/Procedures    Cath: 11/04/20   2nd Mrg lesion is 70% stenosed. Lat 2nd Mrg lesion is 75% stenosed. These are small branches.  The left ventricular systolic function is normal.  LV end diastolic pressure is normal.  The left ventricular ejection fraction is 55-65% by visual estimate.  There is no aortic valve stenosis.  3rd Mrg lesion is 75% stenosed.  A drug-eluting stent was successfully placed using a [place] [id number], postdilated to 2.5 mm, optimized with IVUS.  Post intervention, there is a 0% residual stenosis.  Prox Cx to Mid Cx lesion is 80% stenosed.  A drug-eluting stent was successfully placed using a STENT RESOLUTE ONYX 3.0X8, postdilated to 3.5 mm, optimized with IVUS.  Post intervention, there is a 0% residual stenosis.   Continue dual antiplatelet therapy for at least 1 year.  Medical management for small branch vessel disease.  He will need a consult with a case manager per his daughter, and help with getting medications.   Aggressive secondary prevention.   Diagnostic Dominance: Right  Intervention    _____________   History of Present Illness     Nathan Calderon is a 59 y.o. male with PMH of IDDM, HTN, HLD, testicular Ca and ETOH use who presented to [facility] with abd pain, nausea/vomiting and found to be in DKA. Seen by Dr. Harriet Masson and transferred to [facility] for management of NSTEMI. Nathan Calderon is a 59 yo male with PMH noted above. Presented to [facility] on 12/4 with  complaints of nausea/vomiting, abd pain. Reported he had been in a program where he was receiving his insulin for free but program ended and he ran out of meds. In the ED he was noted to be in severe DKA with dehydration and ARF. He was started on an insulin drip and IV fluids. Troponin I was checked and peaked at 2.74. EKG showed SST with TWI in anterolateral leads. Seen by Dr. Harriet Masson with recommendations for transfer to [facility] with cardiac cath. Echo there showed EF of 60-65% with hypokinesis of the basal inferior/septal wall, along with impaired relaxation. Was able to transition off IV insulin as anion gap closed prior to transfer.    Hospital Course     1. NSTEMI: Trop I peaked at 2.74 while at [facility]. EKG showed ST with TWI in anterolateral leads. Underwent cardiac cath noted above with PCI/DES x1 to 3rd OM, and DESx1 to p/mLcx. Does have residual disease in small branch vessels which will be treated medically. Placed on DAPT with ASA/Brilinta for at least one year. No chest pain post cath. Worked well with cardiac rehab prior to discharge.   2. IDDM with DKA: reports he had been a program receiving his insulin for free. When program ended he has not been able to get meds. CBG >700 at [place]. Treated with IV insulin with improvement in acidosis prior to transfer. Was transitioned to subq insulin. Metformin held while inpatient with cath.  -- plan to resume insulin dosing with Lantus 80units at bedtime. CM assistance with obtaining insulin through the [facility]  pharmacy prior to discharge until able to receive meds from PCP. -- restart metformin 48hrs post cath -- he has scheduled follow up with PCP regarding his insulin regimen going forward.  3. HLD: atorvastatin was increased from 20mg  daily to 80mg  daily prior to discharge -- FLP/LFTs in 8 weeks  4. HTN: stable with the addition of metoprolol 25mg  BID, continued on home regimen of losartan 100mg  daily and Norvasc 10mg  daily  5. Hypokalemia:  Repleted prior to discharge.  Did the patient have an acute coronary syndrome (MI, NSTEMI, STEMI, etc) this admission?:  Yes                               AHA/ACC Clinical Performance & Quality Measures: 1. Aspirin prescribed? - Yes 2. ADP Receptor Inhibitor (Plavix/Clopidogrel, Brilinta/Ticagrelor or Effient/Prasugrel) prescribed (includes medically managed patients)? - Yes 3. Beta Blocker prescribed? - Yes 4. High Intensity Statin (Lipitor 40-80mg  or Crestor 20-40mg ) prescribed? - Yes 5. EF assessed during THIS hospitalization? - Yes 6. For EF <40%, was ACEI/ARB prescribed? - Not Applicable (EF >/= 96%) 7. For EF <40%, Aldosterone Antagonist (Spironolactone or Eplerenone) prescribed? - Not Applicable (EF >/= 29%) 8. Cardiac Rehab Phase II ordered (including medically managed patients)? - Yes       _____________  Discharge Vitals Blood pressure 134/60, pulse 83, temperature 98.6 F (37 C), temperature source Oral, resp. rate 18, height 6\' 1"  (1.854 m), weight 104.1 kg, SpO2 100 %.  Filed Weights   11/04/20 1835 11/05/20 0547  Weight: 109.1 kg 104.1 kg    Labs & Radiologic Studies    CBC Recent Labs    11/04/20 1625 11/05/20 0636  WBC  --  6.3  HGB 12.2* 14.2  HCT 36.0* 37.9*  MCV  --  87.7  PLT  --  528*   Basic Metabolic Panel Recent Labs    11/04/20 1625 11/05/20 0636  NA 138 135  K 3.7 3.2*  CL  --  99  CO2  --  23  GLUCOSE  --  269*  BUN  --  11  CREATININE  --  0.85  CALCIUM  --  8.8*   Liver Function Tests No results for input(s): AST, ALT, ALKPHOS, BILITOT, PROT, ALBUMIN in the last 72 hours. No results for input(s): LIPASE, AMYLASE in the last 72 hours. High Sensitivity Troponin:   No results for input(s): TROPONINIHS in the last 720 hours.  BNP Invalid input(s): POCBNP D-Dimer No results for input(s): DDIMER in the last 72 hours. Hemoglobin A1C Recent Labs    11/05/20 0636  HGBA1C 11.1*   Fasting Lipid Panel No results for input(s): CHOL,  HDL, LDLCALC, TRIG, CHOLHDL, LDLDIRECT in the last 72 hours. Thyroid Function Tests No results for input(s): TSH, T4TOTAL, T3FREE, THYROIDAB in the last 72 hours.  Invalid input(s): FREET3 _____________  CARDIAC CATHETERIZATION  Result Date: 11/05/2020  2nd Mrg lesion is 70% stenosed. Lat 2nd Mrg lesion is 75% stenosed. These are small branches.  The left ventricular systolic function is normal.  LV end diastolic pressure is normal.  The left ventricular ejection fraction is 55-65% by visual estimate.  There is no aortic valve stenosis.  3rd Mrg lesion is 75% stenosed.  A drug-eluting stent was successfully placed using a [place] [id number], postdilated to 2.5 mm, optimized with IVUS.  Post intervention, there is a 0% residual stenosis.  Prox Cx to Mid Cx lesion is 80% stenosed.  A  drug-eluting stent was successfully placed using a STENT RESOLUTE ONYX 3.0X8, postdilated to 3.5 mm, optimized with IVUS.  Post intervention, there is a 0% residual stenosis.  Continue dual antiplatelet therapy for at least 1 year.  Medical management for small branch vessel disease. He will need a consult with a case manager per his daughter, and help with getting medications. Aggressive secondary prevention.   Disposition   Pt is being discharged home today in good condition.  Follow-up Plans & Appointments     Follow-up Information    [place] .   Contact information: [place] [address] [phone]       Berniece Salines, DO Follow up on 11/14/2020.   Specialty: Cardiology Why: at 10am for your follow up appt Contact information: [place] [address] [phone]              Discharge Instructions    Amb Referral to Cardiac Rehabilitation   Complete by: As directed    Will send Cardiac Rehab Phase 2 referral to [place]   Diagnosis:  Coronary Stents NSTEMI     After initial evaluation and  assessments completed: Virtual Based Care may be provided alone or in conjunction with Phase 2 Cardiac Rehab based on patient barriers.: Yes   Diet - low sodium heart healthy   Complete by: As directed    Discharge instructions   Complete by: As directed    Radial Site Care Refer to this sheet in the next few weeks. These instructions provide you with information on caring for yourself after your procedure. Your caregiver may also give you more specific instructions. Your treatment has been planned according to current medical practices, but problems sometimes occur. Call your caregiver if you have any problems or questions after your procedure. HOME CARE INSTRUCTIONS You may shower the day after the procedure.Remove the bandage (dressing) and gently wash the site with plain soap and water.Gently pat the site dry.  Do not apply powder or lotion to the site.  Do not submerge the affected site in water for 3 to 5 days.  Inspect the site at least twice daily.  Do not flex or bend the affected arm for 24 hours.  No lifting over 5 pounds (2.3 kg) for 5 days after your procedure.  Do not drive home if you are discharged the same day of the procedure. Have someone else drive you.  You may drive 24 hours after the procedure unless otherwise instructed by your caregiver.  What to expect: Any bruising will usually fade within 1 to 2 weeks.  Blood that collects in the tissue (hematoma) may be painful to the touch. It should usually decrease in size and tenderness within 1 to 2 weeks.  SEEK IMMEDIATE MEDICAL CARE IF: You have unusual pain at the radial site.  You have redness, warmth, swelling, or pain at the radial site.  You have drainage (other than a small amount of blood on the dressing).  You have chills.  You have a fever or persistent symptoms for more than 72 hours.  You have a fever and your symptoms suddenly get worse.  Your arm becomes pale, cool, tingly, or numb.  You have heavy  bleeding from the site. Hold pressure on the site.   PLEASE DO NOT MISS ANY DOSES OF YOUR BRILINTA!!!!! Also keep a log of you blood pressures and bring back to your follow up appt. Please call the office  with any questions.   Patients taking blood thinners should generally stay away from medicines like ibuprofen, Advil, Motrin, naproxen, and Aleve due to risk of stomach bleeding. You may take Tylenol as directed or talk to your primary doctor about alternatives.   PLEASE ENSURE THAT YOU DO NOT RUN OUT OF YOUR BRILINTA. This medication is very important to remain on for at least one year. IF you have issues obtaining this medication due to cost please CALL the office 3-5 business days prior to running out in order to prevent missing doses of this medication.   Increase activity slowly   Complete by: As directed       Discharge Medications   Allergies as of 11/05/2020      Reactions   Jardiance [empagliflozin] Other (See Comments)   DKA       Medication List    STOP taking these medications   Insulin Glargine 300 UNIT/ML Sopn Replaced by: insulin glargine 100 UNIT/ML Solostar Pen   Toujeo SoloStar 300 UNIT/ML Solostar Pen Generic drug: insulin glargine (1 Unit Dial)     TAKE these medications   amLODipine 10 MG tablet Commonly known as: NORVASC Take 10 mg by mouth daily.   aspirin 81 MG EC tablet Take 1 tablet (81 mg total) by mouth daily. Swallow whole. Start taking on: November 06, 2020 What changed: additional instructions   atorvastatin 80 MG tablet Commonly known as: LIPITOR Take 1 tablet (80 mg total) by mouth daily. Start taking on: November 06, 2020 What changed:   medication strength  how much to take   insulin glargine 100 UNIT/ML Solostar Pen Commonly known as: LANTUS Inject 80 Units into the skin daily. Replaces: Insulin Glargine 300 UNIT/ML Sopn   losartan 100 MG tablet Commonly known as: COZAAR Take 100 mg by mouth daily.   metoprolol tartrate 25  MG tablet Commonly known as: LOPRESSOR Take 1 tablet (25 mg total) by mouth 2 (two) times daily.   nitroGLYCERIN 0.4 MG SL tablet Commonly known as: Nitrostat Place 1 tablet (0.4 mg total) under the tongue every 5 (five) minutes as needed.   ticagrelor 90 MG Tabs tablet Commonly known as: BRILINTA Take 1 tablet (90 mg total) by mouth 2 (two) times daily.          Outstanding Labs/Studies   FLP/LFTs in 8 weeks.   Duration of Discharge Encounter   Greater than 30 minutes including physician time.  Signed, Reino Bellis, NP 11/05/2020, 2:00 PM  I have personally seen and examined this patient. I agree with the assessment and plan as outlined above.  He is doing well this am post PCI of the Circ and OM. Will discharge home today on ASA, statin, Brilinta and beta blocker. F/U in [place] with [place].   Reino Bellis 11/05/2020 .2:00 PM

## 2020-11-05 NOTE — Progress Notes (Signed)
CARDIAC REHAB PHASE I   Went to offer to walk with pt. Pt states some nausea this am, waiting on breakfast. Pt requesting to return in about an hour. Will return to ambulate and complete stent education.  Rufina Falco, RN BSN 11/05/2020 9:51 AM

## 2020-11-05 NOTE — Plan of Care (Signed)

## 2020-11-05 NOTE — Care Management (Addendum)
1002 11-05-20 Case Manager will assist with [facility] program for medications once Rx's are submitted to [place]. Case Manager will follow for additional needs. Bethena Roys, RN,BSN Case Manager    1413 11-05-20 Case Manager spoke with patient regarding hospital follow up appointments. Patient has PCP in [place] and he will keep his Dec.appointment. Patient has reached out to PCP office to submit paperwork for Toujeo. This may take at least 2 weeks for turn around for approval. Pt is paying a high fee for visits in [place] and wanted to see if anywhere would be cheaper. Patient is agreeable to have Case Manager schedule an appointment with the [facility] in Jan. Appointment scheduled and placed on the AVS. [facility] will call the patient to discuss financial assistance. Patient is aware that the pharmacy is onsite and cost ranges from $4.00-$10.00. Case Manager also provided patient with the Number to [facility]. MATCH has been completed and Case Manager spoke with [place] to see if the patient could get some Toujeo assistance. Case Manager will continue to follow for additional assistance.

## 2020-11-05 NOTE — Plan of Care (Signed)
Problem: Education: Goal: Knowledge of General Education information will improve Description: Including pain rating scale, medication(s)/side effects and non-pharmacologic comfort measures 11/05/2020 1407 by Camillia Herter, RN Outcome: Adequate for Discharge 11/05/2020 0729 by Camillia Herter, RN Outcome: Progressing   Problem: Health Behavior/Discharge Planning: Goal: Ability to manage health-related needs will improve 11/05/2020 1407 by Camillia Herter, RN Outcome: Adequate for Discharge 11/05/2020 0729 by Camillia Herter, RN Outcome: Progressing   Problem: Clinical Measurements: Goal: Ability to maintain clinical measurements within normal limits will improve 11/05/2020 1407 by Camillia Herter, RN Outcome: Adequate for Discharge 11/05/2020 0729 by Camillia Herter, RN Outcome: Progressing Goal: Will remain free from infection 11/05/2020 1407 by Camillia Herter, RN Outcome: Adequate for Discharge 11/05/2020 0729 by Camillia Herter, RN Outcome: Progressing Goal: Diagnostic test results will improve 11/05/2020 1407 by Camillia Herter, RN Outcome: Adequate for Discharge 11/05/2020 0729 by Camillia Herter, RN Outcome: Progressing Goal: Respiratory complications will improve 11/05/2020 1407 by Camillia Herter, RN Outcome: Adequate for Discharge 11/05/2020 0729 by Camillia Herter, RN Outcome: Progressing Goal: Cardiovascular complication will be avoided 11/05/2020 1407 by Camillia Herter, RN Outcome: Adequate for Discharge 11/05/2020 0729 by Camillia Herter, RN Outcome: Progressing   Problem: Activity: Goal: Risk for activity intolerance will decrease 11/05/2020 1407 by Camillia Herter, RN Outcome: Adequate for Discharge 11/05/2020 0729 by Camillia Herter, RN Outcome: Progressing   Problem: Nutrition: Goal: Adequate nutrition will be maintained 11/05/2020 1407 by Camillia Herter, RN Outcome: Adequate for Discharge 11/05/2020 0729 by Camillia Herter, RN Outcome: Progressing   Problem:  Coping: Goal: Level of anxiety will decrease 11/05/2020 1407 by Camillia Herter, RN Outcome: Adequate for Discharge 11/05/2020 0729 by Camillia Herter, RN Outcome: Progressing   Problem: Elimination: Goal: Will not experience complications related to bowel motility 11/05/2020 1407 by Camillia Herter, RN Outcome: Adequate for Discharge 11/05/2020 0729 by Camillia Herter, RN Outcome: Progressing Goal: Will not experience complications related to urinary retention 11/05/2020 1407 by Camillia Herter, RN Outcome: Adequate for Discharge 11/05/2020 0729 by Camillia Herter, RN Outcome: Progressing   Problem: Pain Managment: Goal: General experience of comfort will improve 11/05/2020 1407 by Camillia Herter, RN Outcome: Adequate for Discharge 11/05/2020 0729 by Camillia Herter, RN Outcome: Progressing   Problem: Safety: Goal: Ability to remain free from injury will improve 11/05/2020 1407 by Camillia Herter, RN Outcome: Adequate for Discharge 11/05/2020 0729 by Camillia Herter, RN Outcome: Progressing   Problem: Skin Integrity: Goal: Risk for impaired skin integrity will decrease 11/05/2020 1407 by Camillia Herter, RN Outcome: Adequate for Discharge 11/05/2020 0729 by Camillia Herter, RN Outcome: Progressing   Problem: Education: Goal: Understanding of cardiac disease, CV risk reduction, and recovery process will improve 11/05/2020 1407 by Camillia Herter, RN Outcome: Adequate for Discharge 11/05/2020 0729 by Camillia Herter, RN Outcome: Progressing Goal: Individualized Educational Video(s) 11/05/2020 1407 by Camillia Herter, RN Outcome: Adequate for Discharge 11/05/2020 0729 by Camillia Herter, RN Outcome: Progressing   Problem: Activity: Goal: Ability to tolerate increased activity will improve 11/05/2020 1407 by Camillia Herter, RN Outcome: Adequate for Discharge 11/05/2020 0729 by Camillia Herter, RN Outcome: Progressing   Problem: Cardiac: Goal: Ability to achieve and maintain adequate  cardiovascular perfusion will improve 11/05/2020 1407 by Camillia Herter, RN Outcome: Adequate for Discharge 11/05/2020 0729 by Camillia Herter, RN Outcome: Progressing   Problem: Health Behavior/Discharge  Planning: Goal: Ability to safely manage health-related needs after discharge will improve 11/05/2020 1407 by Camillia Herter, RN Outcome: Adequate for Discharge 11/05/2020 0729 by Camillia Herter, RN Outcome: Progressing   Problem: Education: Goal: Understanding of CV disease, CV risk reduction, and recovery process will improve 11/05/2020 1407 by Camillia Herter, RN Outcome: Adequate for Discharge 11/05/2020 0729 by Camillia Herter, RN Outcome: Progressing Goal: Individualized Educational Video(s) 11/05/2020 1407 by Camillia Herter, RN Outcome: Adequate for Discharge 11/05/2020 0729 by Camillia Herter, RN Outcome: Progressing   Problem: Activity: Goal: Ability to return to baseline activity level will improve 11/05/2020 1407 by Camillia Herter, RN Outcome: Adequate for Discharge 11/05/2020 0729 by Camillia Herter, RN Outcome: Progressing   Problem: Cardiovascular: Goal: Ability to achieve and maintain adequate cardiovascular perfusion will improve 11/05/2020 1407 by Camillia Herter, RN Outcome: Adequate for Discharge 11/05/2020 0729 by Camillia Herter, RN Outcome: Progressing Goal: Vascular access site(s) Level 0-1 will be maintained 11/05/2020 1407 by Camillia Herter, RN Outcome: Adequate for Discharge 11/05/2020 0729 by Camillia Herter, RN Outcome: Progressing   Problem: Health Behavior/Discharge Planning: Goal: Ability to safely manage health-related needs after discharge will improve 11/05/2020 1407 by Camillia Herter, RN Outcome: Adequate for Discharge 11/05/2020 0729 by Camillia Herter, RN Outcome: Progressing

## 2020-11-05 NOTE — Progress Notes (Addendum)
Inpatient Diabetes Program Recommendations  AACE/ADA: New Consensus Statement on Inpatient Glycemic Control (2015)  Target Ranges:  Prepandial:   less than 140 mg/dL      Peak postprandial:   less than 180 mg/dL (1-2 hours)      Critically ill patients:  140 - 180 mg/dL   Lab Results  Component Value Date   GLUCAP 245 (H) 11/05/2020   HGBA1C 11.1 (H) 11/05/2020    Review of Glycemic Control Results for Nathan Calderon, Nathan Calderon (MRN [id number]) as of 11/05/2020 10:15  Ref. Range 11/04/2020 21:42 11/05/2020 07:28  Glucose-Capillary Latest Ref Range: 70 - 99 mg/dL 306 (H) 245 (H)   Diabetes history: Type 2 DM Outpatient Diabetes medications: Toujeo 80 units QHS Current orders for Inpatient glycemic control: Lantus 17 units QPM, Novolog 0-15 units TID, Novolog 0-5 units QHS  Inpatient Diabetes Program Recommendations:    Noted consult. Was last seen by DM coordinator in Jan 2020 for DKA and inability to afford outpatient medications. Is paying out of pocket for PCP visit and medications, hence inconsistency with taking due to lack of affordability. Assuming based off A1C that this has still been a challenge. Will plan to see patient and place [facility] consult.   At this time, recommend increasing Lantus to 30 units QHS.   Spoke with patient at length regarding outpatient diabetes management. Patient is followed by a PCP in [place] that he pays out of pocket for visits. Participates in short term program to help provide Touejo and also obtains samples. Has been without medications for 5 days this month and is a continued issue.  Reviewed patient's current A1c of 11.1%. Explained what a A1c is and what it measures. Also reviewed goal A1c with patient, importance of good glucose control @ home, and blood sugar goals. Reviewed patho fo DM, need for insulin, role of pancreas, DKA, impact of missing doses, impact from cardiac perspective, vascular changes, and commorbidities.  Patient has a meter but needs  additional lancets, will provide to patient. Reviewed recommended frequency of checking CBGs, interventions and when to call MD. Has hypoglycemia awareness in the 80's mg/dL.  Patient reports rarely having sugary beverages and makes attempts to be mindful of CHO intake. Encouraged continued mindfulness, choosing alternatives and plate method.  Spoke with Hassan Rowan CM regarding patient need for consistent PCP follow up. Patient is paying $250 per PCP visit, not including medications, which alludes to gaps in insulin dosages. Patient is willing to drive if access to medications improves. Had long conversation with patient that current plan for diabetes management is not being effective when insulin is not available to patient. Suggestions made for [facility] clinic and [facility]. Patient not interested in [facility] clinic, however is open to seeing [facility], Called [facility] to ensure pharmacy has Toujeo-insulin pens are available. Stressed the importance of outpatient follow up, patient in agreement. Plan discussed with Hassan Rowan and Everitt Amber, NP. Also, discussed [facility] insulin as a back up measure; patient is not interested at this time.     Thanks, Bronson Curb, MSN, RNC-OB Diabetes Coordinator [phone] (8a-5p)

## 2020-11-05 NOTE — Progress Notes (Signed)
CARDIAC REHAB PHASE I   PRE:  Rate/Rhythm: 74 SR  BP:  Sitting: 131/72      SaO2: 97 RA  MODE:  Ambulation: 300 ft   POST:  Rate/Rhythm: 90 SR  BP:  Sitting: 140/76    SaO2: 98 RA  Pt ambulated 3101ft in hallway independently with slow, steady gait. Pt denies CP, SOB, or dizziness. Pt educated on importance of ASA, Brilinta, and NTG. Pt given MI book and stent card along with heart healthy and diabetic diets. Reviewed site care, restrictions, and exercise guidelines. Will refer to CRP II [place].  [id number] Rufina Falco, RN BSN 11/05/2020 11:30 AM

## 2020-11-12 DIAGNOSIS — T753XXA Motion sickness, initial encounter: Secondary | ICD-10-CM | POA: Insufficient documentation

## 2020-11-12 DIAGNOSIS — F419 Anxiety disorder, unspecified: Secondary | ICD-10-CM | POA: Insufficient documentation

## 2020-11-12 DIAGNOSIS — R011 Cardiac murmur, unspecified: Secondary | ICD-10-CM | POA: Insufficient documentation

## 2020-11-12 DIAGNOSIS — N200 Calculus of kidney: Secondary | ICD-10-CM | POA: Insufficient documentation

## 2020-11-12 DIAGNOSIS — G4730 Sleep apnea, unspecified: Secondary | ICD-10-CM | POA: Insufficient documentation

## 2020-11-12 DIAGNOSIS — Z789 Other specified health status: Secondary | ICD-10-CM | POA: Insufficient documentation

## 2020-11-13 ENCOUNTER — Telehealth (HOSPITAL_COMMUNITY): Payer: Self-pay

## 2020-11-13 NOTE — Telephone Encounter (Signed)
Cardiac rehab referral for Ph.II faxed to [place]. 

## 2020-11-14 ENCOUNTER — Ambulatory Visit (INDEPENDENT_AMBULATORY_CARE_PROVIDER_SITE_OTHER): Payer: Self-pay | Admitting: Cardiology

## 2020-11-14 ENCOUNTER — Encounter: Payer: Self-pay | Admitting: Cardiology

## 2020-11-14 ENCOUNTER — Other Ambulatory Visit: Payer: Self-pay

## 2020-11-14 VITALS — BP 136/72 | HR 78 | Ht 73.0 in | Wt 234.4 lb

## 2020-11-14 DIAGNOSIS — E782 Mixed hyperlipidemia: Secondary | ICD-10-CM

## 2020-11-14 DIAGNOSIS — Z794 Long term (current) use of insulin: Secondary | ICD-10-CM

## 2020-11-14 DIAGNOSIS — E669 Obesity, unspecified: Secondary | ICD-10-CM | POA: Insufficient documentation

## 2020-11-14 DIAGNOSIS — E1100 Type 2 diabetes mellitus with hyperosmolarity without nonketotic hyperglycemic-hyperosmolar coma (NKHHC): Secondary | ICD-10-CM | POA: Insufficient documentation

## 2020-11-14 DIAGNOSIS — I214 Non-ST elevation (NSTEMI) myocardial infarction: Secondary | ICD-10-CM

## 2020-11-14 DIAGNOSIS — I2510 Atherosclerotic heart disease of native coronary artery without angina pectoris: Secondary | ICD-10-CM

## 2020-11-14 NOTE — Patient Instructions (Signed)
Medication Instructions:  Your physician recommends that you continue on your current medications as directed. Please refer to the Current Medication list given to you today.  *If you need a refill on your cardiac medications before your next appointment, please call your pharmacy*   Lab Work: None ordered   If you have labs (blood work) drawn today and your tests are completely normal, you will receive your results only by:  [place] (if you have MyChart) OR  A paper copy in the mail If you have any lab test that is abnormal or we need to change your treatment, we will call you to review the results.   Testing/Procedures: None ordered    Follow-Up: At [facility], you and your health needs are our priority.  As part of our continuing mission to provide you with exceptional heart care, we have created designated Provider Care Teams.  These Care Teams include your primary Cardiologist (physician) and Advanced Practice Providers (APPs -  Physician Assistants and Nurse Practitioners) who all work together to provide you with the care you need, when you need it.  We recommend signing up for the patient portal called "MyChart".  Sign up information is provided on this After Visit Summary.  MyChart is used to connect with patients for Virtual Visits (Telemedicine).  Patients are able to view lab/test results, encounter notes, upcoming appointments, etc.  Non-urgent messages can be sent to your provider as well.   To learn more about what you can do with MyChart, go to [email or web address].    Your next appointment:   3 month(s)  The format for your next appointment:   In Person  Provider:   Berniece Salines, DO   Other Instructions None

## 2020-11-14 NOTE — Progress Notes (Signed)
Cardiology Office Note:    Date:  11/14/2020   ID:  Nathan Calderon, DOB October 21, 1961, MRN [id number]  PCP:  Philmore Pali, NP  Cardiologist:  Berniece Salines, DO  Electrophysiologist:  None   Referring MD: Philmore Pali, NP   I am doing fine  History of Present Illness:    Nathan Calderon is a 59 y.o. male with a hx of coronary artery disease status post left heart catheterization with 2 drug-eluting stent in the circumflex as well as the obtuse marginal arteries as a result of a non-ST elevation MI, diabetes mellitus, hyperlipidemia, hypertension, OSA not on CPAP, is here today for follow-up visit.I initially met the patient when he was hospitalized in [facility] intensive care unit for diabetic ketoacidosis.  He developed NSTEMI with appropriate symptoms he was transferred to [facility] where he underwent a left heart catheterization November 04, 2020.  He was discharged to home and tells me he has been doing well.  He is still trying to work out the application for obtaining Medicaid.  He offers no complaints at this time.  Past Medical History:  Diagnosis Date  . Angina    2006, had cardiac cath at the time  . Anxiety   . Coronary artery disease   . Diabetes mellitus    Type 2  . Headache(784.0)   . Heart murmur    as child  . Hyperlipidemia   . Hypertension   . Kidney stone    hx of  . Medical history non-contributory   . Motion sickness    ocean boats  . Sleep apnea    no CPAP.  Never got it.  . Testicular cancer ([place]) 04/08/2016    Past Surgical History:  Procedure Laterality Date  . CARDIAC CATHETERIZATION  2000   and 2006  . COLONOSCOPY WITH PROPOFOL N/A 05/12/2017   Procedure: COLONOSCOPY WITH PROPOFOL;  Surgeon: Lucilla Lame, MD;  Location: [place];  Service: Endoscopy;  Laterality: N/A;  Cannot arrive before 8:45 Diabetic - insulin and oral meds sleep apnea  . CORONARY STENT INTERVENTION N/A 11/04/2020   Procedure: CORONARY STENT  INTERVENTION;  Surgeon: Jettie Booze, MD;  Location: [place] [facility];  Service: Cardiovascular;  Laterality: N/A;  . EYE SURGERY     x 2  . HERNIA REPAIR     with undecended testicle  . INTRAVASCULAR ULTRASOUND/IVUS N/A 11/04/2020   Procedure: Intravascular Ultrasound/IVUS;  Surgeon: Jettie Booze, MD;  Location: [place] [facility];  Service: Cardiovascular;  Laterality: N/A;  . LASIK    . ORCHIECTOMY Right 04/08/2016   Procedure: ORCHIECTOMY/ INGUINAL APPROACH;  Surgeon: [name], MD;  Location: [facility];  Service: Urology;  Laterality: Right;  . PARS PLANA VITRECTOMY  04/04/2012   Procedure: PARS PLANA VITRECTOMY WITH 25 GAUGE;  Surgeon: [name], MD;  Location: [place];  Service: Ophthalmology;  Laterality: Left;  with laser  . POLYPECTOMY  05/12/2017   Procedure: POLYPECTOMY;  Surgeon: Lucilla Lame, MD;  Location: [place];  Service: Endoscopy;;  . RIGHT/LEFT HEART CATH AND CORONARY ANGIOGRAPHY N/A 11/04/2020   Procedure: RIGHT/LEFT HEART CATH AND CORONARY ANGIOGRAPHY;  Surgeon: Jettie Booze, MD;  Location: [place] [facility];  Service: Cardiovascular;  Laterality: N/A;  . SHOULDER SURGERY     left shoulder  . VASECTOMY Left 04/08/2016   Procedure: VASECTOMY;  Surgeon: [name], MD;  Location: [facility];  Service: Urology;  Laterality: Left;  Current Medications: Current Meds  Medication Sig  . amLODipine (NORVASC) 10 MG tablet Take 10 mg by mouth daily.  Marland Kitchen aspirin EC 81 MG EC tablet Take 1 tablet (81 mg total) by mouth daily. Swallow whole.  Marland Kitchen atorvastatin (LIPITOR) 80 MG tablet Take 1 tablet (80 mg total) by mouth daily.  . insulin glargine (LANTUS) 100 UNIT/ML Solostar Pen Inject 80 Units into the skin daily.  Marland Kitchen losartan (COZAAR) 100 MG tablet Take 100 mg by mouth daily.  . metoprolol tartrate (LOPRESSOR) 25 MG tablet Take 1 tablet (25 mg total) by mouth 2 (two) times daily.  . nitroGLYCERIN (NITROSTAT) 0.4 MG SL tablet Place 1  tablet (0.4 mg total) under the tongue every 5 (five) minutes as needed.  . ticagrelor (BRILINTA) 90 MG TABS tablet Take 1 tablet (90 mg total) by mouth 2 (two) times daily.     Allergies:   Jardiance [empagliflozin]   Social History   Socioeconomic History  . Marital status: Divorced    Spouse name: Not on file  . Number of children: Not on file  . Years of education: Not on file  . Highest education level: Not on file  Occupational History  . Not on file  Tobacco Use  . Smoking status: Never Smoker  . Smokeless tobacco: Never Used  Vaping Use  . Vaping Use: Never used  Substance and Sexual Activity  . Alcohol use: Yes    Alcohol/week: 24.0 standard drinks    Types: 24 Cans of beer per week    Comment: 3-4 beers a day ( "not every day") 2- 3 times a week  . Drug use: No  . Sexual activity: Not Currently    Birth control/protection: None  Other Topics Concern  . Not on file  Social History Narrative  . Not on file   Social Determinants of Health   Financial Resource Strain: Not on file  Food Insecurity: Not on file  Transportation Needs: Not on file  Physical Activity: Not on file  Stress: Not on file  Social Connections: Not on file     Family History: The patient's family history includes Diabetes in his mother; Heart disease in his father and mother; Hypertension in his mother; Ovarian cancer in his maternal grandmother. There is no history of Kidney disease or Prostate cancer.  ROS:   Review of Systems  Constitution: Negative for decreased appetite, fever and weight gain.  HENT: Negative for congestion, ear discharge, hoarse voice and sore throat.   Eyes: Negative for discharge, redness, vision loss in right eye and visual halos.  Cardiovascular: Negative for chest pain, dyspnea on exertion, leg swelling, orthopnea and palpitations.  Respiratory: Negative for cough, hemoptysis, shortness of breath and snoring.   Endocrine: Negative for heat intolerance and  polyphagia.  Hematologic/Lymphatic: Negative for bleeding problem. Does not bruise/bleed easily.  Skin: Negative for flushing, nail changes, rash and suspicious lesions.  Musculoskeletal: Negative for arthritis, joint pain, muscle cramps, myalgias, neck pain and stiffness.  Gastrointestinal: Negative for abdominal pain, bowel incontinence, diarrhea and excessive appetite.  Genitourinary: Negative for decreased libido, genital sores and incomplete emptying.  Neurological: Negative for brief paralysis, focal weakness, headaches and loss of balance.  Psychiatric/Behavioral: Negative for altered mental status, depression and suicidal ideas.  Allergic/Immunologic: Negative for HIV exposure and persistent infections.    EKGs/Labs/Other Studies Reviewed:    The following studies were reviewed today:   EKG:  None today    Echocardiogram done at [facility] shows overall EF  60 to 65%.  Hypokinesis of the basal inferior wall and basal inferoseptal wall.  Grade 1 diastolic dysfunction.  RV was normal.  Left atrium normal size.  Right atrium normal size.  Mild aortic sclerosis without stenosis.  Trace mitral regurgitation.  Trace tricuspid regurgitation.  Aortic root, ascending aorta and aortic arch are appear to be normal.  No pericardial fusion.  Cardiac catheterization   2nd Mrg lesion is 70% stenosed. Lat 2nd Mrg lesion is 75% stenosed. These are small branches.  The left ventricular systolic function is normal.  LV end diastolic pressure is normal.  The left ventricular ejection fraction is 55-65% by visual estimate.  There is no aortic valve stenosis.  3rd Mrg lesion is 75% stenosed.  A drug-eluting stent was successfully placed using a [place] [id number], postdilated to 2.5 mm, optimized with IVUS.  Post intervention, there is a 0% residual stenosis.  Prox Cx to Mid Cx lesion is 80% stenosed.  A drug-eluting stent was successfully placed using a STENT RESOLUTE ONYX  3.0X8, postdilated to 3.5 mm, optimized with IVUS.  Post intervention, there is a 0% residual stenosis.   Continue dual antiplatelet therapy for at least 1 year.  Medical management for small branch vessel disease.  He will need a consult with a case manager per his daughter, and help with getting medications.   Aggressive secondary prevention.     Recent Labs: 11/05/2020: BUN 11; Creatinine, Ser 0.85; Hemoglobin 14.2; Platelets 143; Potassium 3.2; Sodium 135  Recent Lipid Panel No results found for: CHOL, TRIG, HDL, CHOLHDL, VLDL, LDLCALC, LDLDIRECT  Physical Exam:    VS:  BP 136/72   Pulse 78   Ht 6' 1"  (1.854 m)   Wt 234 lb 6.4 oz (106.3 kg)   SpO2 98%   BMI 30.93 kg/m     Wt Readings from Last 3 Encounters:  11/14/20 234 lb 6.4 oz (106.3 kg)  11/05/20 229 lb 9.6 oz (104.1 kg)  11/29/18 220 lb 7.4 oz (100 kg)     GEN: Well nourished, well developed in no acute distress HEENT: Normal NECK: No JVD; No carotid bruits LYMPHATICS: No lymphadenopathy CARDIAC: S1S2 noted,RRR, no murmurs, rubs, gallops RESPIRATORY:  Clear to auscultation without rales, wheezing or rhonchi  ABDOMEN: Soft, non-tender, non-distended, +bowel sounds, no guarding. EXTREMITIES: No edema, No cyanosis, no clubbing MUSCULOSKELETAL:  No deformity  SKIN: Warm and dry NEUROLOGIC:  Alert and oriented x 3, non-focal PSYCHIATRIC:  Normal affect, good insight  ASSESSMENT:    1. Coronary artery disease involving native coronary artery of native heart without angina pectoris   2. Non-ST elevation (NSTEMI) myocardial infarction ([place])   3. Mixed hyperlipidemia   4. Obesity (BMI 30-39.9)   5. Type 2 diabetes mellitus with hyperosmolarity without coma, with long-term current use of insulin ([place])    PLAN:     He appears to be doing well from a cardiovascular standpoint.  Educated patient about maintaining dual antiplatelet therapy he is on aspirin Brilinta.  The major problem for this patient is  affordability.  We do have samples which I am going to give him some samples for Brilinta.  He is in the process of filling out form for his medical insurance.  He also has gotten some medications for his diabetes from his PCP as well.  I explained to the patient that he needs to be on his dual antiplatelet medication for 1 year he expresses understanding.  His blood pressure is acceptable in the office.  The patient understands the need to lose weight with diet and exercise. We have discussed specific strategies for this.  The patient is in agreement with the above plan. The patient left the office in stable condition.  The patient will follow up in 3 months or sooner if needed   Medication Adjustments/Labs and Tests Ordered: Current medicines are reviewed at length with the patient today.  Concerns regarding medicines are outlined above.  No orders of the defined types were placed in this encounter.  No orders of the defined types were placed in this encounter.   Patient Instructions  Medication Instructions:  Your physician recommends that you continue on your current medications as directed. Please refer to the Current Medication list given to you today.  *If you need a refill on your cardiac medications before your next appointment, please call your pharmacy*   Lab Work: None ordered   If you have labs (blood work) drawn today and your tests are completely normal, you will receive your results only by: Marland Kitchen MyChart Message (if you have MyChart) OR . A paper copy in the mail If you have any lab test that is abnormal or we need to change your treatment, we will call you to review the results.   Testing/Procedures: None ordered    Follow-Up: At [facility], you and your health needs are our priority.  As part of our continuing mission to provide you with exceptional heart care, we have created designated Provider Care Teams.  These Care Teams include your primary Cardiologist  (physician) and Advanced Practice Providers (APPs -  Physician Assistants and Nurse Practitioners) who all work together to provide you with the care you need, when you need it.  We recommend signing up for the patient portal called "MyChart".  Sign up information is provided on this After Visit Summary.  MyChart is used to connect with patients for Virtual Visits (Telemedicine).  Patients are able to view lab/test results, encounter notes, upcoming appointments, etc.  Non-urgent messages can be sent to your provider as well.   To learn more about what you can do with MyChart, go to [email or web address].    Your next appointment:   3 month(s)  The format for your next appointment:   In Person  Provider:   Berniece Salines, DO   Other Instructions None      Adopting a Healthy Lifestyle.  Know what a healthy weight is for you (roughly BMI <25) and aim to maintain this   Aim for 7+ servings of fruits and vegetables daily   65-80+ fluid ounces of water or unsweet tea for healthy kidneys   Limit to max 1 drink of alcohol per day; avoid smoking/tobacco   Limit animal fats in diet for cholesterol and heart health - choose grass fed whenever available   Avoid highly processed foods, and foods high in saturated/trans fats   Aim for low stress - take time to unwind and care for your mental health   Aim for 150 min of moderate intensity exercise weekly for heart health, and weights twice weekly for bone health   Aim for 7-9 hours of sleep daily   When it comes to diets, agreement about the perfect plan isnt easy to find, even among the experts. Experts at the [employer] developed an idea known as the Healthy Eating Plate. Just imagine a plate divided into logical, healthy portions.   The emphasis is on diet quality:   Load up on vegetables  and fruits - one-half of your plate: Aim for color and variety, and remember that potatoes dont count.   Go for whole  grains - one-quarter of your plate: Whole wheat, barley, wheat berries, quinoa, oats, brown rice, and foods made with them. If you want pasta, go with whole wheat pasta.   Protein power - one-quarter of your plate: Fish, chicken, beans, and nuts are all healthy, versatile protein sources. Limit red meat.   The diet, however, does go beyond the plate, offering a few other suggestions.   Use healthy plant oils, such as olive, canola, soy, corn, sunflower and peanut. Check the labels, and avoid partially hydrogenated oil, which have unhealthy trans fats.   If youre thirsty, drink water. Coffee and tea are good in moderation, but skip sugary drinks and limit milk and dairy products to one or two daily servings.   The type of carbohydrate in the diet is more important than the amount. Some sources of carbohydrates, such as vegetables, fruits, whole grains, and beans-are healthier than others.   Finally, stay active  Signed, Berniece Salines, DO  11/14/2020 12:02 PM    [place]

## 2020-12-15 ENCOUNTER — Encounter: Payer: Self-pay | Admitting: Nurse Practitioner

## 2020-12-15 ENCOUNTER — Ambulatory Visit: Payer: Self-pay | Attending: Nurse Practitioner | Admitting: Nurse Practitioner

## 2020-12-15 ENCOUNTER — Other Ambulatory Visit: Payer: Self-pay | Admitting: Pharmacist

## 2020-12-15 ENCOUNTER — Other Ambulatory Visit: Payer: Self-pay

## 2020-12-15 DIAGNOSIS — Z09 Encounter for follow-up examination after completed treatment for conditions other than malignant neoplasm: Secondary | ICD-10-CM

## 2020-12-15 DIAGNOSIS — E1100 Type 2 diabetes mellitus with hyperosmolarity without nonketotic hyperglycemic-hyperosmolar coma (NKHHC): Secondary | ICD-10-CM

## 2020-12-15 DIAGNOSIS — Z794 Long term (current) use of insulin: Secondary | ICD-10-CM

## 2020-12-15 DIAGNOSIS — E785 Hyperlipidemia, unspecified: Secondary | ICD-10-CM

## 2020-12-15 DIAGNOSIS — I214 Non-ST elevation (NSTEMI) myocardial infarction: Secondary | ICD-10-CM

## 2020-12-15 MED ORDER — TRUE METRIX BLOOD GLUCOSE TEST VI STRP: ORAL_STRIP | 12 refills | Status: DC

## 2020-12-15 MED ORDER — TRUE METRIX METER W/DEVICE KIT: PACK | 0 refills | Status: AC

## 2020-12-15 MED ORDER — BD PEN NEEDLE MINI U/F 31G X 5 MM MISC: 6 refills | Status: DC

## 2020-12-15 MED ORDER — METFORMIN HCL 500 MG PO TABS: 1000.0000 mg | ORAL_TABLET | Freq: Two times a day (BID) | ORAL | 3 refills | Status: DC

## 2020-12-15 MED ORDER — TRUEPLUS LANCETS 28G MISC: 3 refills | Status: DC

## 2020-12-15 MED ORDER — TRULICITY 0.75 MG/0.5ML ~~LOC~~ SOAJ: 0.7500 mg | SUBCUTANEOUS | 6 refills | Status: DC

## 2020-12-15 MED ORDER — TICAGRELOR 90 MG PO TABS
90.0000 mg | ORAL_TABLET | Freq: Two times a day (BID) | ORAL | 2 refills | Status: AC
  Filled 2021-05-29: qty 180, 90d supply, fill #0
  Filled 2021-09-01: qty 180, 90d supply, fill #1

## 2020-12-15 MED FILL — TRUE METRIX GLUCOSE TEST ST: 30 days supply | Qty: 100 | Fill #0

## 2020-12-15 MED FILL — METFORMIN HCL 500 MG TABS: 500 | 30 days supply | Qty: 120 | Fill #0

## 2020-12-15 MED FILL — TRUEplus LANCETS 28G MISC: 30 days supply | Qty: 100 | Fill #0

## 2020-12-15 MED FILL — !TRUE METRIX BLOOD GLUCOSE: 365 days supply | Qty: 1 | Fill #0

## 2020-12-15 MED FILL — !TRULICITY 0.75 MG/0.5 ML P: 0.75 | 28 days supply | Qty: 2 | Fill #0

## 2020-12-15 NOTE — Progress Notes (Signed)
Virtual Visit via Telephone Note Due to national recommendations of social distancing due to [place] 19, telehealth visit is felt to be most appropriate for this patient at this time.  I discussed the limitations, risks, security and privacy concerns of performing an evaluation and management service by telephone and the availability of in person appointments. I also discussed with the patient that there may be a patient responsible charge related to this service. The patient expressed understanding and agreed to proceed.    I connected with Nathan Calderon on 12/15/20  at   2:30 PM EST  EDT by telephone and verified that I am speaking with the correct person using two identifiers.   Consent I discussed the limitations, risks, security and privacy concerns of performing an evaluation and management service by telephone and the availability of in person appointments. I also discussed with the patient that there may be a patient responsible charge related to this service. The patient expressed understanding and agreed to proceed.   Location of Patient: Private Residence   Location of Provider: [place] [facility] [employer] Office    Persons participating in Telemedicine visit: Nathan Rankins FNP-BC [place]    History of Present Illness: Telemedicine visit for: HFU He has a history of testicular cancer,  coronary artery disease, poorly controlled diabetes mellitus, hyperlipidemia, hypertension, OSA not on CPAP and anxiety  Overall main concern today is paying for Brillinta. Doing well and denies chest pain, N/V or shortness of breath.    HFU Recently admitted to the hospital and treated for DKA and on December 7 with non-STEMI.  Currently status post cardiac stenting x2. Jardiance was stopped due to DKA/lactic acidosis.  Had a recent follow-up on December 17 with cardiology with instructions to continue dual antiplatelet therapy of Brilinta and aspirin.  He states he is  unable to afford the Brilinta out-of-pocket.  I will send prescription to the community clinic to see if he is eligible for the patient assistance program.  DM 2 DM is poorly controlled. He is currently taking metformin 500 mg daily. Will increase to 2 tablets twice per day. Also on Toujeo 60-80 units daily. Unable to take Jardiance or [place] due to allergic reaction causing DKA. Will try Trulicity weekly in addition to increasing metformin. Needs repeat BMP in 2 weeks after starting new medication. Currently on ARB and Statin.  Lab Results  Component Value Date   HGBA1C 11.1 (H) 11/05/2020   Past Medical History:  Diagnosis Date  . Angina    2006, had cardiac cath at the time  . Coronary artery disease   . Diabetes mellitus    Type 2  . Headache(784.0)   . Heart murmur    as child  . Hyperlipidemia   . Hypertension   . Kidney stone    hx of  . Medical history non-contributory   . Motion sickness    ocean boats  . Sleep apnea    no CPAP.  Never got it.  . Testicular cancer ([place]) 04/08/2016    Past Surgical History:  Procedure Laterality Date  . CARDIAC CATHETERIZATION  2000   and 2006  . COLONOSCOPY WITH PROPOFOL N/A 05/12/2017   Procedure: COLONOSCOPY WITH PROPOFOL;  Surgeon: Lucilla Lame, MD;  Location: [place];  Service: Endoscopy;  Laterality: N/A;  Cannot arrive before 8:45 Diabetic - insulin and oral meds sleep apnea  . CORONARY STENT INTERVENTION N/A 11/04/2020   Procedure: CORONARY STENT INTERVENTION;  Surgeon:  Jettie Booze, MD;  Location: [place] [facility];  Service: Cardiovascular;  Laterality: N/A;  . EYE SURGERY     x 2  . HERNIA REPAIR     with undecended testicle  . INTRAVASCULAR ULTRASOUND/IVUS N/A 11/04/2020   Procedure: Intravascular Ultrasound/IVUS;  Surgeon: Jettie Booze, MD;  Location: [place] [facility];  Service: Cardiovascular;  Laterality: N/A;  . LASIK    . ORCHIECTOMY Right 04/08/2016   Procedure: ORCHIECTOMY/ INGUINAL  APPROACH;  Surgeon: [name], MD;  Location: [facility];  Service: Urology;  Laterality: Right;  . PARS PLANA VITRECTOMY  04/04/2012   Procedure: PARS PLANA VITRECTOMY WITH 25 GAUGE;  Surgeon: [name], MD;  Location: [place];  Service: Ophthalmology;  Laterality: Left;  with laser  . POLYPECTOMY  05/12/2017   Procedure: POLYPECTOMY;  Surgeon: Lucilla Lame, MD;  Location: [place];  Service: Endoscopy;;  . RIGHT/LEFT HEART CATH AND CORONARY ANGIOGRAPHY N/A 11/04/2020   Procedure: RIGHT/LEFT HEART CATH AND CORONARY ANGIOGRAPHY;  Surgeon: Jettie Booze, MD;  Location: [place] [facility];  Service: Cardiovascular;  Laterality: N/A;  . SHOULDER SURGERY     left shoulder  . testicle remove    . VASECTOMY Left 04/08/2016   Procedure: VASECTOMY;  Surgeon: [name], MD;  Location: [facility];  Service: Urology;  Laterality: Left;    Family History  Problem Relation Age of Onset  . Diabetes Mother   . Heart disease Mother   . Hypertension Mother   . Heart disease Father   . Ovarian cancer Maternal Grandmother   . Kidney disease Neg Hx   . Prostate cancer Neg Hx     Social History   Socioeconomic History  . Marital status: Divorced    Spouse name: Not on file  . Number of children: Not on file  . Years of education: Not on file  . Highest education level: Not on file  Occupational History  . Not on file  Tobacco Use  . Smoking status: Never Smoker  . Smokeless tobacco: Never Used  Vaping Use  . Vaping Use: Never used  Substance and Sexual Activity  . Alcohol use: Yes    Alcohol/week: 24.0 standard drinks    Types: 24 Cans of beer per week    Comment: 3-4 beers a day ( "not every day") 2- 3 times a week  . Drug use: No  . Sexual activity: Not Currently    Birth control/protection: None  Other Topics Concern  . Not on file  Social History Narrative  . Not on file   Social Determinants of Health   Financial Resource Strain: Not on file  Food  Insecurity: Not on file  Transportation Needs: Not on file  Physical Activity: Not on file  Stress: Not on file  Social Connections: Not on file     Observations/Objective: Awake, alert and oriented x 3   Review of Systems  Constitutional: Negative for fever, malaise/fatigue and weight loss.  HENT: Negative.  Negative for nosebleeds.   Eyes: Negative.  Negative for blurred vision, double vision and photophobia.  Respiratory: Negative.  Negative for cough and shortness of breath.   Cardiovascular: Negative.  Negative for chest pain, palpitations and leg swelling.  Gastrointestinal: Negative.  Negative for heartburn, nausea and vomiting.  Musculoskeletal: Negative.  Negative for myalgias.  Neurological: Negative.  Negative for dizziness, focal weakness, seizures and headaches.  Psychiatric/Behavioral: Negative.  Negative for suicidal ideas.    Assessment and Plan: Belenda Cruise  was seen today for hospitalization follow-up.  Diagnoses and all orders for this visit:  Hospital discharge follow-up  Type 2 diabetes mellitus with hyperosmolarity without coma, with long-term current use of insulin (HCC) -     Dulaglutide (TRULICITY) 8.41 YS/0.6TK SOPN; Inject 0.75 mg into the skin once a week. NEEDS PASS. -     Insulin Pen Needle (B-D UF III MINI PEN NEEDLES) 31G X 5 MM MISC; Use as instructed. Inject into the skin once weekly -     metFORMIN (GLUCOPHAGE) 500 MG tablet; Take 2 tablets (1,000 mg total) by mouth 2 (two) times daily with a meal. NEEDS PASS -     Blood Glucose Monitoring Suppl (TRUE METRIX METER) w/Device KIT; Use as instructed. Check blood glucose level by fingerstick three times per day. E11.65 -     glucose blood (TRUE METRIX BLOOD GLUCOSE TEST) test strip; Use as instructed. Check blood glucose level by fingerstick three times per day. E11.65 -     TRUEplus Lancets 28G MISC; Use as instructed. Check blood glucose level by fingerstick three times per day. E11.65 Continue blood  sugar control as discussed in office today, low carbohydrate diet, and regular physical exercise as tolerated, 150 minutes per week (30 min each day, 5 days per week, or 50 min 3 days per week). Keep blood sugar logs with fasting goal of 90-130 mg/dl, post prandial (after you eat) less than 180.  For Hypoglycemia: BS <60 and Hyperglycemia BS >400; contact the clinic ASAP. Annual eye exams and foot exams are recommended.   Non-ST elevation (NSTEMI) myocardial infarction (HCC) -     ticagrelor (BRILINTA) 90 MG TABS tablet; Take 1 tablet (90 mg total) by mouth 2 (two) times daily. NEEDS PASS  Dyslipidemia, goal LDL below 70 INSTRUCTIONS: Work on a low fat, heart healthy diet and participate in regular aerobic exercise program by working out at least 150 minutes per week; 5 days a week-30 minutes per day. Avoid red meat/beef/steak,  fried foods. junk foods, sodas, sugary drinks, unhealthy snacking, alcohol and smoking.  Drink at least 80 oz of water per day and monitor your carbohydrate intake daily.     Follow Up Instructions Return in about 2 years (around 12/15/2022) for BMP.     I discussed the assessment and treatment plan with the patient. The patient was provided an opportunity to ask questions and all were answered. The patient agreed with the plan and demonstrated an understanding of the instructions.   The patient was advised to call back or seek an in-person evaluation if the symptoms worsen or if the condition fails to improve as anticipated.  I provided 19 minutes of non-face-to-face time during this encounter including median intraservice time, reviewing previous notes, labs, imaging, medications and explaining diagnosis and management.  Gildardo Pounds, FNP-BC

## 2020-12-16 MED FILL — BRILINTA 90 MG TABLET: 90 | 30 days supply | Qty: 60 | Fill #0

## 2020-12-23 ENCOUNTER — Other Ambulatory Visit: Payer: Self-pay

## 2020-12-30 ENCOUNTER — Other Ambulatory Visit: Payer: Self-pay

## 2021-01-06 ENCOUNTER — Other Ambulatory Visit: Payer: Self-pay | Admitting: Nurse Practitioner

## 2021-01-06 ENCOUNTER — Other Ambulatory Visit: Payer: Self-pay

## 2021-01-06 ENCOUNTER — Ambulatory Visit: Payer: Self-pay | Attending: Nurse Practitioner

## 2021-01-06 DIAGNOSIS — Z1211 Encounter for screening for malignant neoplasm of colon: Secondary | ICD-10-CM

## 2021-01-06 DIAGNOSIS — Z125 Encounter for screening for malignant neoplasm of prostate: Secondary | ICD-10-CM

## 2021-01-06 DIAGNOSIS — D696 Thrombocytopenia, unspecified: Secondary | ICD-10-CM

## 2021-01-06 DIAGNOSIS — Z794 Long term (current) use of insulin: Secondary | ICD-10-CM

## 2021-01-06 DIAGNOSIS — E1100 Type 2 diabetes mellitus with hyperosmolarity without nonketotic hyperglycemic-hyperosmolar coma (NKHHC): Secondary | ICD-10-CM

## 2021-01-06 DIAGNOSIS — E785 Hyperlipidemia, unspecified: Secondary | ICD-10-CM

## 2021-01-07 ENCOUNTER — Other Ambulatory Visit: Payer: Self-pay

## 2021-01-07 LAB — CBC WITH DIFFERENTIAL/PLATELET
Basophils Absolute: 0 10*3/uL (ref 0.0–0.2)
Basos: 1 %
EOS (ABSOLUTE): 0.2 10*3/uL (ref 0.0–0.4)
Eos: 2 %
Hematocrit: 39.5 % (ref 37.5–51.0)
Hemoglobin: 13.8 g/dL (ref 13.0–17.7)
Immature Grans (Abs): 0 10*3/uL (ref 0.0–0.1)
Immature Granulocytes: 0 %
Lymphocytes Absolute: 2.1 10*3/uL (ref 0.7–3.1)
Lymphs: 26 %
MCH: 33.2 pg — ABNORMAL HIGH (ref 26.6–33.0)
MCHC: 34.9 g/dL (ref 31.5–35.7)
MCV: 95 fL (ref 79–97)
Monocytes Absolute: 0.7 10*3/uL (ref 0.1–0.9)
Monocytes: 8 %
Neutrophils Absolute: 4.9 10*3/uL (ref 1.4–7.0)
Neutrophils: 63 %
Platelets: 255 10*3/uL (ref 150–450)
RBC: 4.16 x10E6/uL (ref 4.14–5.80)
RDW: 13.1 % (ref 11.6–15.4)
WBC: 7.9 10*3/uL (ref 3.4–10.8)

## 2021-01-07 LAB — PSA: Prostate Specific Ag, Serum: 0.5 ng/mL (ref 0.0–4.0)

## 2021-01-07 LAB — CMP14+EGFR
ALT: 27 IU/L (ref 0–44)
AST: 17 IU/L (ref 0–40)
Albumin/Globulin Ratio: 1.7 (ref 1.2–2.2)
Albumin: 4.4 g/dL (ref 3.8–4.9)
Alkaline Phosphatase: 56 IU/L (ref 44–121)
BUN/Creatinine Ratio: 11 (ref 9–20)
BUN: 10 mg/dL (ref 6–24)
Bilirubin Total: 0.3 mg/dL (ref 0.0–1.2)
CO2: 22 mmol/L (ref 20–29)
Calcium: 9.4 mg/dL (ref 8.7–10.2)
Chloride: 92 mmol/L — ABNORMAL LOW (ref 96–106)
Creatinine, Ser: 0.88 mg/dL (ref 0.76–1.27)
GFR calc Af Amer: 109 mL/min/{1.73_m2} (ref >59)
GFR calc non Af Amer: 94 mL/min/{1.73_m2} (ref >59)
Globulin, Total: 2.6 g/dL (ref 1.5–4.5)
Glucose: 133 mg/dL — ABNORMAL HIGH (ref 65–99)
Potassium: 4.4 mmol/L (ref 3.5–5.2)
Sodium: 132 mmol/L — ABNORMAL LOW (ref 134–144)
Total Protein: 7 g/dL (ref 6.0–8.5)

## 2021-01-07 LAB — LIPID PANEL
Chol/HDL Ratio: 1.9 ratio (ref 0.0–5.0)
Cholesterol, Total: 120 mg/dL (ref 100–199)
HDL: 63 mg/dL (ref >39)
LDL Chol Calc (NIH): 45 mg/dL (ref 0–99)
Triglycerides: 52 mg/dL (ref 0–149)
VLDL Cholesterol Cal: 12 mg/dL (ref 5–40)

## 2021-01-12 MED FILL — ?TRULICITY 0.75 MG/0.5ML PE: 0.75 | 28 days supply | Qty: 2 | Fill #1

## 2021-01-12 MED FILL — BRILINTA 90 MG TABLET: 90 | 30 days supply | Qty: 60 | Fill #1

## 2021-01-30 ENCOUNTER — Telehealth: Payer: Self-pay | Admitting: Nurse Practitioner

## 2021-01-30 NOTE — Telephone Encounter (Signed)
Nathan Calderon, from [employer], calling to let PCP know that she will be mailing over pts prescriptions. She states that they will be at the office on 02/03/21 in 3 month supply. Please advise.      [phone]

## 2021-02-03 ENCOUNTER — Ambulatory Visit: Payer: Self-pay | Admitting: Nurse Practitioner

## 2021-02-23 DIAGNOSIS — I2510 Atherosclerotic heart disease of native coronary artery without angina pectoris: Secondary | ICD-10-CM | POA: Insufficient documentation

## 2021-02-24 ENCOUNTER — Ambulatory Visit: Payer: Self-pay | Admitting: Cardiology

## 2021-02-24 MED FILL — $BRILINTA 90 MG TABLET: 90 | 90 days supply | Qty: 180 | Fill #2

## 2021-03-06 ENCOUNTER — Ambulatory Visit (INDEPENDENT_AMBULATORY_CARE_PROVIDER_SITE_OTHER): Payer: Self-pay | Admitting: Cardiology

## 2021-03-06 ENCOUNTER — Other Ambulatory Visit: Payer: Self-pay

## 2021-03-06 ENCOUNTER — Encounter: Payer: Self-pay | Admitting: Cardiology

## 2021-03-06 VITALS — BP 160/70 | HR 75 | Ht 73.0 in | Wt 251.6 lb

## 2021-03-06 DIAGNOSIS — G4733 Obstructive sleep apnea (adult) (pediatric): Secondary | ICD-10-CM

## 2021-03-06 DIAGNOSIS — I2510 Atherosclerotic heart disease of native coronary artery without angina pectoris: Secondary | ICD-10-CM

## 2021-03-06 DIAGNOSIS — Z794 Long term (current) use of insulin: Secondary | ICD-10-CM

## 2021-03-06 DIAGNOSIS — I10 Essential (primary) hypertension: Secondary | ICD-10-CM

## 2021-03-06 DIAGNOSIS — E669 Obesity, unspecified: Secondary | ICD-10-CM

## 2021-03-06 DIAGNOSIS — E1100 Type 2 diabetes mellitus with hyperosmolarity without nonketotic hyperglycemic-hyperosmolar coma (NKHHC): Secondary | ICD-10-CM

## 2021-03-06 NOTE — Progress Notes (Signed)
Cardiology Office Note:    Date:  03/06/2021   ID:  Nathan Calderon, DOB 10/15/1961, MRN [id number]  PCP:  Gildardo Pounds, NP  Cardiologist:  Berniece Salines, DO  Electrophysiologist:  None   Referring MD: Philmore Pali, NP   I am doing well  History of Present Illness:    Nathan Calderon is a 60 y.o. male with a hx of coronary artery disease status post left heart catheterization with 2 drug-eluting stent in the circumflex as well as the obtuse marginal arteries as a result of a non-ST elevation MI, diabetes mellitus, hyperlipidemia, hypertension, OSA not on CPAP, is here today for follow-up visit.  I initially met the patient when he was hospitalized in [facility] intensive care unit for diabetic ketoacidosis.  He developed NSTEMI with appropriate symptoms he was transferred to [facility] where he underwent a left heart catheterization November 04, 2020.  I saw the patient November 14, 2020 at that time he was status post left heart catheterization with stent placement.  He was on aspirin and Brilinta.  He is here today for follow-up visit.  He offers no complaints at this time.   Past Medical History:  Diagnosis Date  . Angina    2006, had cardiac cath at the time  . Coronary artery disease   . Diabetes mellitus    Type 2  . Headache(784.0)   . Heart murmur    as child  . Hyperlipidemia   . Hypertension   . Kidney stone    hx of  . Medical history non-contributory   . Motion sickness    ocean boats  . Sleep apnea    no CPAP.  Never got it.  . Testicular cancer ([place]) 04/08/2016    Past Surgical History:  Procedure Laterality Date  . CARDIAC CATHETERIZATION  2000   and 2006  . COLONOSCOPY WITH PROPOFOL N/A 05/12/2017   Procedure: COLONOSCOPY WITH PROPOFOL;  Surgeon: Lucilla Lame, MD;  Location: [place];  Service: Endoscopy;  Laterality: N/A;  Cannot arrive before 8:45 Diabetic - insulin and oral meds sleep apnea  . CORONARY STENT INTERVENTION N/A  11/04/2020   Procedure: CORONARY STENT INTERVENTION;  Surgeon: Jettie Booze, MD;  Location: [place] [facility];  Service: Cardiovascular;  Laterality: N/A;  . EYE SURGERY     x 2  . HERNIA REPAIR     with undecended testicle  . INTRAVASCULAR ULTRASOUND/IVUS N/A 11/04/2020   Procedure: Intravascular Ultrasound/IVUS;  Surgeon: Jettie Booze, MD;  Location: [place] [facility];  Service: Cardiovascular;  Laterality: N/A;  . LASIK    . ORCHIECTOMY Right 04/08/2016   Procedure: ORCHIECTOMY/ INGUINAL APPROACH;  Surgeon: [name], MD;  Location: [facility];  Service: Urology;  Laterality: Right;  . PARS PLANA VITRECTOMY  04/04/2012   Procedure: PARS PLANA VITRECTOMY WITH 25 GAUGE;  Surgeon: [name], MD;  Location: [place];  Service: Ophthalmology;  Laterality: Left;  with laser  . POLYPECTOMY  05/12/2017   Procedure: POLYPECTOMY;  Surgeon: Lucilla Lame, MD;  Location: [place];  Service: Endoscopy;;  . RIGHT/LEFT HEART CATH AND CORONARY ANGIOGRAPHY N/A 11/04/2020   Procedure: RIGHT/LEFT HEART CATH AND CORONARY ANGIOGRAPHY;  Surgeon: Jettie Booze, MD;  Location: [place] [facility];  Service: Cardiovascular;  Laterality: N/A;  . SHOULDER SURGERY     left shoulder  . testicle remove    . VASECTOMY Left 04/08/2016   Procedure: VASECTOMY;  Surgeon: [name],  MD;  Location: [facility];  Service: Urology;  Laterality: Left;    Current Medications: Current Meds  Medication Sig  . amLODipine (NORVASC) 10 MG tablet Take 10 mg by mouth daily.  Marland Kitchen aspirin EC 81 MG tablet Take 81 mg by mouth daily. Swallow whole.  Marland Kitchen atorvastatin (LIPITOR) 80 MG tablet Take 80 mg by mouth daily.  . Blood Glucose Monitoring Suppl (TRUE METRIX METER) w/Device KIT Use as instructed. Check blood glucose level by fingerstick three times per day. E11.65  . Dulaglutide 0.75 MG/0.5ML SOPN INJECT 0.75 MG INTO THE SKIN ONCE A WEEK.  . insulin glargine, 2 Unit Dial, (TOUJEO MAX SOLOSTAR) 300  UNIT/ML Solostar Pen Inject 67-80 Units into the skin daily.  . Insulin Pen Needle 32G X 4 MM MISC USE AS DIRECTED  . losartan (COZAAR) 100 MG tablet Take 100 mg by mouth daily.  . metFORMIN (GLUCOPHAGE) 500 MG tablet Take 500 mg by mouth 2 (two) times daily with a meal.  . metoprolol tartrate (LOPRESSOR) 25 MG tablet Take 25 mg by mouth 2 (two) times daily.  . nitroGLYCERIN (NITROSTAT) 0.4 MG SL tablet Place 0.4 mg under the tongue every 5 (five) minutes as needed for chest pain.  . ticagrelor (BRILINTA) 90 MG TABS tablet Take 1 tablet (90 mg total) by mouth 2 (two) times daily. NEEDS PASS  . [DISCONTINUED] aspirin 81 MG EC tablet TAKE 1 TABLET (81 MG TOTAL) BY MOUTH DAILY. SWALLOW WHOLE. (Patient taking differently: Take by mouth 5 (five) times daily. SWALLOW WHOLE.)  . [DISCONTINUED] atorvastatin (LIPITOR) 80 MG tablet TAKE 1 TABLET (80 MG TOTAL) BY MOUTH DAILY. (Patient taking differently: Take 80 mg by mouth daily.)  . [DISCONTINUED] glucose blood test strip CHECK BLOOD GLUCOSE LEVEL BY FINGERSTICK THREE TIMES PER DAY. E11.65 (Patient taking differently: CHECK BLOOD GLUCOSE LEVEL BY FINGERSTICK THREE TIMES PER DAY. E11.65)  . [DISCONTINUED] metoprolol tartrate (LOPRESSOR) 25 MG tablet TAKE 1 TABLET (25 MG TOTAL) BY MOUTH TWO TIMES DAILY. (Patient taking differently: Take 25 mg by mouth 2 (two) times daily.)  . [DISCONTINUED] TRUEplus Lancets 28G MISC CHECK BLOOD GLUCOSE LEVEL BY FINGERSTICK THREE TIMES PER DAY. E11.65     Allergies:   Jardiance [empagliflozin]   Social History   Socioeconomic History  . Marital status: Divorced    Spouse name: Not on file  . Number of children: Not on file  . Years of education: Not on file  . Highest education level: Not on file  Occupational History  . Not on file  Tobacco Use  . Smoking status: Never Smoker  . Smokeless tobacco: Never Used  Vaping Use  . Vaping Use: Never used  Substance and Sexual Activity  . Alcohol use: Yes     Alcohol/week: 24.0 standard drinks    Types: 24 Cans of beer per week    Comment: 3-4 beers a day ( "not every day") 2- 3 times a week  . Drug use: No  . Sexual activity: Not Currently    Birth control/protection: None  Other Topics Concern  . Not on file  Social History Narrative  . Not on file   Social Determinants of Health   Financial Resource Strain: Not on file  Food Insecurity: Not on file  Transportation Needs: Not on file  Physical Activity: Not on file  Stress: Not on file  Social Connections: Not on file     Family History: The patient's family history includes Diabetes in his mother; Heart disease in his father and  mother; Hypertension in his mother; Ovarian cancer in his maternal grandmother. There is no history of Kidney disease or Prostate cancer.  ROS:   Review of Systems  Constitution: Negative for decreased appetite, fever and weight gain.  HENT: Negative for congestion, ear discharge, hoarse voice and sore throat.   Eyes: Negative for discharge, redness, vision loss in right eye and visual halos.  Cardiovascular: Negative for chest pain, dyspnea on exertion, leg swelling, orthopnea and palpitations.  Respiratory: Negative for cough, hemoptysis, shortness of breath and snoring.   Endocrine: Negative for heat intolerance and polyphagia.  Hematologic/Lymphatic: Negative for bleeding problem. Does not bruise/bleed easily.  Skin: Negative for flushing, nail changes, rash and suspicious lesions.  Musculoskeletal: Negative for arthritis, joint pain, muscle cramps, myalgias, neck pain and stiffness.  Gastrointestinal: Negative for abdominal pain, bowel incontinence, diarrhea and excessive appetite.  Genitourinary: Negative for decreased libido, genital sores and incomplete emptying.  Neurological: Negative for brief paralysis, focal weakness, headaches and loss of balance.  Psychiatric/Behavioral: Negative for altered mental status, depression and suicidal ideas.   Allergic/Immunologic: Negative for HIV exposure and persistent infections.    EKGs/Labs/Other Studies Reviewed:    The following studies were reviewed today:   EKG: None today  Echocardiogram done at [facility] shows overall EF 60 to 65%.  Hypokinesis of the basal inferior wall and basal inferoseptal wall.  Grade 1 diastolic dysfunction.  RV was normal.  Left atrium normal size.  Right atrium normal size.  Mild aortic sclerosis without stenosis.  Trace mitral regurgitation.  Trace tricuspid regurgitation.  Aortic root, ascending aorta and aortic arch are appear to be normal.  No pericardial fusion.  Cardiac catheterization   2nd Mrg lesion is 70% stenosed. Lat 2nd Mrg lesion is 75% stenosed. These are small branches.  The left ventricular systolic function is normal.  LV end diastolic pressure is normal.  The left ventricular ejection fraction is 55-65% by visual estimate.  There is no aortic valve stenosis.  3rd Mrg lesion is 75% stenosed.  A drug-eluting stent was successfully placed using a [place] [id number], postdilated to 2.5 mm, optimized with IVUS.  Post intervention, there is a 0% residual stenosis.  Prox Cx to Mid Cx lesion is 80% stenosed.  A drug-eluting stent was successfully placed using a STENT RESOLUTE ONYX 3.0X8, postdilated to 3.5 mm, optimized with IVUS.  Post intervention, there is a 0% residual stenosis. Continue dual antiplatelet therapy for at least 1 year. Medical management for small branch vessel disease.  Recent Labs: 01/06/2021: ALT 27; BUN 10; Creatinine, Ser 0.88; Hemoglobin 13.8; Platelets 255; Potassium 4.4; Sodium 132  Recent Lipid Panel    Component Value Date/Time   CHOL 120 01/06/2021 0900   TRIG 52 01/06/2021 0900   HDL 63 01/06/2021 0900   CHOLHDL 1.9 01/06/2021 0900   LDLCALC 45 01/06/2021 0900    Physical Exam:    VS:  BP (!) 160/70   Pulse 75   Ht 6' 1"  (1.854 m)   Wt 251 lb 9.6 oz (114.1 kg)   SpO2 98%    BMI 33.19 kg/m     Wt Readings from Last 3 Encounters:  03/06/21 251 lb 9.6 oz (114.1 kg)  11/14/20 234 lb 6.4 oz (106.3 kg)  11/05/20 229 lb 9.6 oz (104.1 kg)     GEN: Well nourished, well developed in no acute distress HEENT: Normal NECK: No JVD; No carotid bruits LYMPHATICS: No lymphadenopathy CARDIAC: S1S2 noted,RRR, no murmurs, rubs, gallops RESPIRATORY:  Clear  to auscultation without rales, wheezing or rhonchi  ABDOMEN: Soft, non-tender, non-distended, +bowel sounds, no guarding. EXTREMITIES: No edema, No cyanosis, no clubbing MUSCULOSKELETAL:  No deformity  SKIN: Warm and dry NEUROLOGIC:  Alert and oriented x 3, non-focal PSYCHIATRIC:  Normal affect, good insight  ASSESSMENT:    1. Coronary artery disease involving native coronary artery of native heart without angina pectoris   2. Obstructive sleep apnea syndrome   3. Type 2 diabetes mellitus with hyperosmolarity without coma, with long-term current use of insulin (HCC)   4. Obesity (BMI 30-39.9)   5. Hypertension, unspecified type    PLAN:     His blood pressure is elevated in the office today manually taken by me 160/70 mmHg.  This is an isolated elevated blood pressure.  I like to have the patient take his blood pressure daily for the next 2 weeks.  He will send me this information through my chart for the next 2 weeks should he be elevated we will start him on additional antihypertensive medication to optimize his blood pressure to less than 130/80 mmHg. He is going to watch his salt as well.  He has no anginal symptoms I will keep the patient on his dual antiplatelet therapy which includes aspirin and Brilinta.  Continue his atorvastatin 80 mg daily.  This is being managed by his primary care doctor.  No adjustments for antidiabetic medications were made today.   Hyperlipidemia - continue with current statin medication.  The patient understands the need to lose weight with diet and exercise. We have  discussed specific strategies for this.  The patient is in agreement with the above plan. The patient left the office in stable condition.  The patient will follow up in 4 weeks for close follow-up due to his elevated blood pressure.   Medication Adjustments/Labs and Tests Ordered: Current medicines are reviewed at length with the patient today.  Concerns regarding medicines are outlined above.  No orders of the defined types were placed in this encounter.  No orders of the defined types were placed in this encounter.   Patient Instructions  Medication Instructions:  Your physician recommends that you continue on your current medications as directed. Please refer to the Current Medication list given to you today.  *If you need a refill on your cardiac medications before your next appointment, please call your pharmacy*   Lab Work: None If you have labs (blood work) drawn today and your tests are completely normal, you will receive your results only by: Marland Kitchen MyChart Message (if you have MyChart) OR . A paper copy in the mail If you have any lab test that is abnormal or we need to change your treatment, we will call you to review the results.   Testing/Procedures: None   Follow-Up: At [facility], you and your health needs are our priority.  As part of our continuing mission to provide you with exceptional heart care, we have created designated Provider Care Teams.  These Care Teams include your primary Cardiologist (physician) and Advanced Practice Providers (APPs -  Physician Assistants and Nurse Practitioners) who all work together to provide you with the care you need, when you need it.  We recommend signing up for the patient portal called "MyChart".  Sign up information is provided on this After Visit Summary.  MyChart is used to connect with patients for Virtual Visits (Telemedicine).  Patients are able to view lab/test results, encounter notes, upcoming appointments, etc.   Non-urgent messages can be  sent to your provider as well.   To learn more about what you can do with MyChart, go to [email or web address].    Your next appointment:   6 week(s)  The format for your next appointment:   In Person  Provider:   Berniece Salines, DO   Other Instructions      Adopting a Healthy Lifestyle.  Know what a healthy weight is for you (roughly BMI <25) and aim to maintain this   Aim for 7+ servings of fruits and vegetables daily   65-80+ fluid ounces of water or unsweet tea for healthy kidneys   Limit to max 1 drink of alcohol per day; avoid smoking/tobacco   Limit animal fats in diet for cholesterol and heart health - choose grass fed whenever available   Avoid highly processed foods, and foods high in saturated/trans fats   Aim for low stress - take time to unwind and care for your mental health   Aim for 150 min of moderate intensity exercise weekly for heart health, and weights twice weekly for bone health   Aim for 7-9 hours of sleep daily   When it comes to diets, agreement about the perfect plan isnt easy to find, even among the experts. Experts at the [employer] developed an idea known as the Healthy Eating Plate. Just imagine a plate divided into logical, healthy portions.   The emphasis is on diet quality:   Load up on vegetables and fruits - one-half of your plate: Aim for color and variety, and remember that potatoes dont count.   Go for whole grains - one-quarter of your plate: Whole wheat, barley, wheat berries, quinoa, oats, brown rice, and foods made with them. If you want pasta, go with whole wheat pasta.   Protein power - one-quarter of your plate: Fish, chicken, beans, and nuts are all healthy, versatile protein sources. Limit red meat.   The diet, however, does go beyond the plate, offering a few other suggestions.   Use healthy plant oils, such as olive, canola, soy, corn, sunflower and peanut. Check the  labels, and avoid partially hydrogenated oil, which have unhealthy trans fats.   If youre thirsty, drink water. Coffee and tea are good in moderation, but skip sugary drinks and limit milk and dairy products to one or two daily servings.   The type of carbohydrate in the diet is more important than the amount. Some sources of carbohydrates, such as vegetables, fruits, whole grains, and beans-are healthier than others.   Finally, stay active  Signed, Berniece Salines, DO  03/06/2021 11:16 PM    [facility]

## 2021-03-06 NOTE — Patient Instructions (Signed)
Medication Instructions:  Your physician recommends that you continue on your current medications as directed. Please refer to the Current Medication list given to you today.  *If you need a refill on your cardiac medications before your next appointment, please call your pharmacy*   Lab Work: None If you have labs (blood work) drawn today and your tests are completely normal, you will receive your results only by: Marland Kitchen MyChart Message (if you have MyChart) OR . A paper copy in the mail If you have any lab test that is abnormal or we need to change your treatment, we will call you to review the results.   Testing/Procedures: None   Follow-Up: At [facility], you and your health needs are our priority.  As part of our continuing mission to provide you with exceptional heart care, we have created designated Provider Care Teams.  These Care Teams include your primary Cardiologist (physician) and Advanced Practice Providers (APPs -  Physician Assistants and Nurse Practitioners) who all work together to provide you with the care you need, when you need it.  We recommend signing up for the patient portal called "MyChart".  Sign up information is provided on this After Visit Summary.  MyChart is used to connect with patients for Virtual Visits (Telemedicine).  Patients are able to view lab/test results, encounter notes, upcoming appointments, etc.  Non-urgent messages can be sent to your provider as well.   To learn more about what you can do with MyChart, go to [email or web address].    Your next appointment:   6 week(s)  The format for your next appointment:   In Person  Provider:   Berniece Salines, DO   Other Instructions

## 2021-03-10 ENCOUNTER — Other Ambulatory Visit: Payer: Self-pay

## 2021-03-10 MED ORDER — CARVEDILOL 25 MG PO TABS
25.0000 mg | ORAL_TABLET | Freq: Two times a day (BID) | ORAL | 3 refills | Status: AC
  Filled 2021-03-10: qty 60, 30d supply, fill #0
  Filled 2021-04-09: qty 60, 30d supply, fill #1
  Filled 2021-05-05: qty 60, 30d supply, fill #2
  Filled 2021-06-02 – 2021-06-09 (×2): qty 60, 30d supply, fill #3
  Filled 2021-07-13: qty 60, 30d supply, fill #4
  Filled 2021-08-12: qty 60, 30d supply, fill #5
  Filled 2021-09-14: qty 60, 30d supply, fill #6
  Filled 2021-10-16: qty 60, 30d supply, fill #7
  Filled 2021-11-18: qty 60, 30d supply, fill #8
  Filled 2021-12-21: qty 60, 30d supply, fill #0
  Filled 2022-01-25: qty 60, 30d supply, fill #1

## 2021-03-27 ENCOUNTER — Ambulatory Visit: Payer: Self-pay | Admitting: Nurse Practitioner

## 2021-04-09 ENCOUNTER — Other Ambulatory Visit: Payer: Self-pay

## 2021-04-10 ENCOUNTER — Ambulatory Visit: Payer: Self-pay | Admitting: Cardiology

## 2021-04-15 ENCOUNTER — Other Ambulatory Visit: Payer: Self-pay

## 2021-04-17 ENCOUNTER — Other Ambulatory Visit: Payer: Self-pay

## 2021-04-20 ENCOUNTER — Other Ambulatory Visit: Payer: Self-pay

## 2021-04-20 MED FILL — Dulaglutide Soln Auto-injector 0.75 MG/0.5ML: SUBCUTANEOUS | 112 days supply | Qty: 8 | Fill #0 | Status: CN

## 2021-04-20 MED FILL — Dulaglutide Soln Auto-injector 0.75 MG/0.5ML: SUBCUTANEOUS | 84 days supply | Qty: 6 | Fill #0 | Status: CN

## 2021-04-29 ENCOUNTER — Other Ambulatory Visit: Payer: Self-pay

## 2021-05-05 ENCOUNTER — Other Ambulatory Visit: Payer: Self-pay

## 2021-05-05 MED FILL — Dulaglutide Soln Auto-injector 0.75 MG/0.5ML: SUBCUTANEOUS | 84 days supply | Qty: 6 | Fill #0 | Status: AC

## 2021-05-06 ENCOUNTER — Other Ambulatory Visit: Payer: Self-pay

## 2021-05-18 DIAGNOSIS — I209 Angina pectoris, unspecified: Secondary | ICD-10-CM | POA: Insufficient documentation

## 2021-05-19 ENCOUNTER — Ambulatory Visit: Payer: Self-pay | Admitting: Cardiology

## 2021-05-27 ENCOUNTER — Ambulatory Visit: Payer: Self-pay | Admitting: Nurse Practitioner

## 2021-05-27 ENCOUNTER — Telehealth: Payer: Self-pay | Admitting: Nurse Practitioner

## 2021-05-29 ENCOUNTER — Other Ambulatory Visit: Payer: Self-pay

## 2021-05-29 ENCOUNTER — Other Ambulatory Visit: Payer: Self-pay | Admitting: Nurse Practitioner

## 2021-05-29 DIAGNOSIS — E1100 Type 2 diabetes mellitus with hyperosmolarity without nonketotic hyperglycemic-hyperosmolar coma (NKHHC): Secondary | ICD-10-CM

## 2021-05-29 NOTE — Telephone Encounter (Signed)
Notes to clinic:  scripts requested have been d/c Review for continued use and refill    Requested Prescriptions  Pending Prescriptions Disp Refills   ticagrelor (BRILINTA) 90 MG TABS tablet 180 tablet 2    Sig: TAKE 1 TABLET (90 MG TOTAL) BY MOUTH TWO TIMES DAILY.      Not Delegated - Hematology: Antiplatelets - ticagrelor Failed - 05/29/2021 11:20 AM      Failed - This refill cannot be delegated      Passed - Cr in normal range and within 180 days    Creatinine, Ser  Date Value Ref Range Status  01/06/2021 0.88 0.76 - 1.27 mg/dL Final          Passed - HCT in normal range and within 180 days    Hematocrit  Date Value Ref Range Status  01/06/2021 39.5 37.5 - 51.0 % Final          Passed - HGB in normal range and within 180 days    Hemoglobin  Date Value Ref Range Status  01/06/2021 13.8 13.0 - 17.7 g/dL Final          Passed - PLT in normal range and within 180 days    Platelets  Date Value Ref Range Status  01/06/2021 255 150 - 450 x10E3/uL Final          Passed - Valid encounter within last 6 months    Recent Outpatient Visits           5 months ago Hospital discharge follow-up   [place] Gildardo Pounds, NP       Future Appointments             In 1 month Gildardo Pounds, NP [place]               metFORMIN (GLUCOPHAGE) 500 MG tablet 120 tablet 3    Sig: TAKE 2 TABLETS (1,000 MG TOTAL) BY MOUTH 2 (TWO) TIMES DAILY WITH A MEAL.      Endocrinology:  Diabetes - Biguanides Failed - 05/29/2021 11:20 AM      Failed - HBA1C is between 0 and 7.9 and within 180 days    Hgb A1c MFr Bld  Date Value Ref Range Status  11/05/2020 11.1 (H) 4.8 - 5.6 % Final    Comment:    (NOTE) Pre diabetes:          5.7%-6.4%  Diabetes:              >6.4%  Glycemic control for   <7.0% adults with diabetes           Passed - Cr in normal range and within 360 days    Creatinine, Ser  Date Value  Ref Range Status  01/06/2021 0.88 0.76 - 1.27 mg/dL Final          Passed - eGFR in normal range and within 360 days    GFR calc Af Amer  Date Value Ref Range Status  01/06/2021 109 >59 mL/min/1.73 Final    Comment:    **In accordance with recommendations from the NKF-ASN Task force,**   [facility] is in the process of updating its eGFR calculation to the   2021 CKD-EPI creatinine equation that estimates kidney function   without a race variable.    GFR, Estimated  Date Value Ref Range Status  11/05/2020 >60 >60 mL/min Final    Comment:    (NOTE) Calculated  using the CKD-EPI Creatinine Equation (2021)    GFR calc non Af Amer  Date Value Ref Range Status  01/06/2021 94 >59 mL/min/1.73 Final          Passed - Valid encounter within last 6 months    Recent Outpatient Visits           5 months ago Hospital discharge follow-up   [place] Gildardo Pounds, NP       Future Appointments             In 1 month Gildardo Pounds, NP [place]

## 2021-06-02 ENCOUNTER — Other Ambulatory Visit: Payer: Self-pay

## 2021-06-02 ENCOUNTER — Other Ambulatory Visit: Payer: Self-pay | Admitting: Nurse Practitioner

## 2021-06-02 DIAGNOSIS — Z794 Long term (current) use of insulin: Secondary | ICD-10-CM

## 2021-06-04 ENCOUNTER — Other Ambulatory Visit: Payer: Self-pay

## 2021-06-09 ENCOUNTER — Other Ambulatory Visit: Payer: Self-pay

## 2021-06-11 ENCOUNTER — Other Ambulatory Visit: Payer: Self-pay

## 2021-07-13 ENCOUNTER — Other Ambulatory Visit: Payer: Self-pay

## 2021-07-14 ENCOUNTER — Other Ambulatory Visit: Payer: Self-pay

## 2021-07-15 ENCOUNTER — Ambulatory Visit: Payer: Self-pay | Admitting: Nurse Practitioner

## 2021-07-15 ENCOUNTER — Encounter: Payer: Self-pay | Admitting: Nurse Practitioner

## 2021-07-15 ENCOUNTER — Other Ambulatory Visit: Payer: Self-pay

## 2021-08-04 ENCOUNTER — Other Ambulatory Visit: Payer: Self-pay

## 2021-08-04 ENCOUNTER — Encounter: Payer: Self-pay | Admitting: Nurse Practitioner

## 2021-08-04 ENCOUNTER — Ambulatory Visit: Payer: Self-pay | Attending: Nurse Practitioner | Admitting: Nurse Practitioner

## 2021-08-04 VITALS — BP 133/69 | HR 66 | Ht 73.0 in | Wt 251.1 lb

## 2021-08-04 DIAGNOSIS — E1100 Type 2 diabetes mellitus with hyperosmolarity without nonketotic hyperglycemic-hyperosmolar coma (NKHHC): Secondary | ICD-10-CM

## 2021-08-04 DIAGNOSIS — Z794 Long term (current) use of insulin: Secondary | ICD-10-CM

## 2021-08-04 DIAGNOSIS — I252 Old myocardial infarction: Secondary | ICD-10-CM

## 2021-08-04 DIAGNOSIS — R351 Nocturia: Secondary | ICD-10-CM

## 2021-08-04 DIAGNOSIS — R195 Other fecal abnormalities: Secondary | ICD-10-CM

## 2021-08-04 DIAGNOSIS — I10 Essential (primary) hypertension: Secondary | ICD-10-CM

## 2021-08-04 DIAGNOSIS — N401 Enlarged prostate with lower urinary tract symptoms: Secondary | ICD-10-CM

## 2021-08-04 DIAGNOSIS — R1084 Generalized abdominal pain: Secondary | ICD-10-CM

## 2021-08-04 LAB — POCT GLYCOSYLATED HEMOGLOBIN (HGB A1C): Hemoglobin A1C: 8.7 % — AB (ref 4.0–5.6)

## 2021-08-04 LAB — GLUCOSE, POCT (MANUAL RESULT ENTRY): POC Glucose: 255 mg/dl — AB (ref 70–99)

## 2021-08-04 MED ORDER — TRULICITY 1.5 MG/0.5ML ~~LOC~~ SOAJ
1.5000 mg | SUBCUTANEOUS | 1 refills | Status: DC
Start: 2021-08-04 — End: 2022-01-25
  Filled 2021-08-04: qty 2, 28d supply, fill #0
  Filled 2021-09-01: qty 2, 28d supply, fill #1
  Filled 2021-09-30: qty 2, 28d supply, fill #2

## 2021-08-04 MED ORDER — TRUE METRIX BLOOD GLUCOSE TEST VI STRP
ORAL_STRIP | 12 refills | Status: AC
  Filled 2021-08-04: qty 100, 25d supply, fill #0

## 2021-08-04 MED FILL — Dulaglutide Soln Auto-injector 0.75 MG/0.5ML: SUBCUTANEOUS | 28 days supply | Qty: 2 | Fill #1 | Status: CN

## 2021-08-04 NOTE — Progress Notes (Signed)
Assessment & Plan:  Nathan Calderon was seen today for diabetes.  Diagnoses and all orders for this visit:  Type 2 diabetes mellitus with hyperosmolarity without coma, with long-term current use of insulin (HCC) -     POCT glucose (manual entry) -     POCT glycosylated hemoglobin (Hb A1C) -     CMP14+EGFR -     Dulaglutide (TRULICITY) 1.5 AJ/2.8NO SOPN; Inject 1.5 mg into the skin once a week. NEEDS PASS -     glucose blood (TRUE METRIX BLOOD GLUCOSE TEST) test strip; Use as instructed Continue blood sugar control as discussed in office today, low carbohydrate diet, and regular physical exercise as tolerated, 150 minutes per week (30 min each day, 5 days per week, or 50 min 3 days per week). Keep blood sugar logs with fasting goal of 90-130 mg/dl, post prandial (after you eat) less than 180.  For Hypoglycemia: BS <60 and Hyperglycemia BS >400; contact the clinic ASAP. Annual eye exams and foot exams are recommended.   Primary hypertension Continue all antihypertensives as prescribed.  Remember to bring in your blood pressure log with you for your follow up appointment.  DASH/Mediterranean Diets are healthier choices for HTN.    Nocturia more than twice per night -     PSA  Generalized abdominal pain -     CBC with Differential -     Lipase  Loose stools -     Lipase   Patient has been counseled on age-appropriate routine health concerns for screening and prevention. These are reviewed and up-to-date. Referrals have been placed accordingly. Immunizations are up-to-date or declined.    Subjective:   Chief Complaint  Patient presents with   Diabetes   HPI Nathan Calderon 60 y.o. male presents to office today for follow-up to diabetes and hypertension.  He has a history of coronary artery disease status post PCI with stent x2, non-STEMI, OSA with nonadherence to CPAP, dyslipidemia  He has been alternating between [facility] and another Primary care clinic in [facility]. I have  instructed him that he can not have 2 PCPs and he states he will continue with [facility] at this time.   He was evaluated by cardiology on April 8 and noted for elevated blood pressure.  Instructions at that time were for patient to check his blood pressures at home over the following 2 weeks and report blood pressure readings back to cardiology.  I do not see in his MyChart where he reported back his readings to the cardiologist after his visit. Blood pressure is well controlled today. He is taking amlodipine 45m daily, carvedilol 25 mg BID, losartan 100 mg daily and lopressor 25 mg BID. Denies chest pain, shortness of breath, palpitations, lightheadedness, dizziness, headaches or BLE edema.   BP Readings from Last 3 Encounters:  08/04/21 133/69  03/06/21 (!) 160/70  11/14/20 136/72     DM 2 Improved. Increase Trulicity from  06.76to 1.5 mg weekly.  He has been unable to take metformin due to GI upset with loose stools and abdominal pain. Will dc at this time. He will continue on Toujeo 75 units although he has been titrating it himself between 67-80 units daily. He also drinks beer frequently which could be contributing to the GI upset or possibly the Toujeo.  LDL at goal with atorvastatin 80 mg daily.  Lab Results  Component Value Date   HGBA1C 8.7 (A) 08/04/2021    Lab Results  Component Value Date  HGBA1C 11.1 (H) 11/05/2020    Lab Results  Component Value Date   LDLCALC 45 01/06/2021       Review of Systems  Constitutional:  Negative for fever, malaise/fatigue and weight loss.  HENT: Negative.  Negative for nosebleeds.   Eyes: Negative.  Negative for blurred vision, double vision and photophobia.  Respiratory: Negative.  Negative for cough and shortness of breath.   Cardiovascular: Negative.  Negative for chest pain, palpitations and leg swelling.  Gastrointestinal:  Positive for abdominal pain and diarrhea. Negative for heartburn, nausea and vomiting.  Genitourinary:  Positive  for frequency and urgency. Negative for dysuria, flank pain and hematuria.       Nocturia  Musculoskeletal: Negative.  Negative for myalgias.  Neurological: Negative.  Negative for dizziness, focal weakness, seizures and headaches.  Psychiatric/Behavioral: Negative.  Negative for suicidal ideas.    Past Medical History:  Diagnosis Date   Angina    2006, had cardiac cath at the time   Coronary artery disease    Diabetes mellitus    Type 2   Headache(784.0)    Heart murmur    as child   Hyperlipidemia    Hypertension    Kidney stone    hx of   Medical history non-contributory    Motion sickness    ocean boats   Sleep apnea    no CPAP.  Never got it.   Testicular cancer ([place]) 04/08/2016    Past Surgical History:  Procedure Laterality Date   CARDIAC CATHETERIZATION  2000   and 2006   COLONOSCOPY WITH PROPOFOL N/A 05/12/2017   Procedure: COLONOSCOPY WITH PROPOFOL;  Surgeon: Lucilla Lame, MD;  Location: [place];  Service: Endoscopy;  Laterality: N/A;  Cannot arrive before 8:45 Diabetic - insulin and oral meds sleep apnea   CORONARY STENT INTERVENTION N/A 11/04/2020   Procedure: CORONARY STENT INTERVENTION;  Surgeon: Jettie Booze, MD;  Location: [place] [facility];  Service: Cardiovascular;  Laterality: N/A;   EYE SURGERY     x 2   HERNIA REPAIR     with undecended testicle   INTRAVASCULAR ULTRASOUND/IVUS N/A 11/04/2020   Procedure: Intravascular Ultrasound/IVUS;  Surgeon: Jettie Booze, MD;  Location: [place] [facility];  Service: Cardiovascular;  Laterality: N/A;   LASIK     ORCHIECTOMY Right 04/08/2016   Procedure: ORCHIECTOMY/ INGUINAL APPROACH;  Surgeon: [name], MD;  Location: [facility];  Service: Urology;  Laterality: Right;   PARS PLANA VITRECTOMY  04/04/2012   Procedure: PARS PLANA VITRECTOMY WITH 25 GAUGE;  Surgeon: [name], MD;  Location: [place];  Service: Ophthalmology;  Laterality: Left;  with laser   POLYPECTOMY  05/12/2017    Procedure: POLYPECTOMY;  Surgeon: Lucilla Lame, MD;  Location: [place];  Service: Endoscopy;;   RIGHT/LEFT HEART CATH AND CORONARY ANGIOGRAPHY N/A 11/04/2020   Procedure: RIGHT/LEFT HEART CATH AND CORONARY ANGIOGRAPHY;  Surgeon: Jettie Booze, MD;  Location: [place] [facility];  Service: Cardiovascular;  Laterality: N/A;   SHOULDER SURGERY     left shoulder   testicle remove     VASECTOMY Left 04/08/2016   Procedure: VASECTOMY;  Surgeon: [name], MD;  Location: [facility];  Service: Urology;  Laterality: Left;    Family History  Problem Relation Age of Onset   Diabetes Mother    Heart disease Mother    Hypertension Mother    Heart disease Father    Ovarian cancer Maternal Grandmother    Kidney disease  Neg Hx    Prostate cancer Neg Hx     Social History Reviewed with no changes to be made today.   Outpatient Medications Prior to Visit  Medication Sig Dispense Refill   amLODipine (NORVASC) 10 MG tablet Take 10 mg by mouth daily.  5   aspirin EC 81 MG tablet Take 81 mg by mouth daily. Swallow whole.     atorvastatin (LIPITOR) 80 MG tablet Take 80 mg by mouth daily.     Blood Glucose Monitoring Suppl (TRUE METRIX METER) w/Device KIT Use as instructed. Check blood glucose level by fingerstick three times per day. E11.65 1 kit 0   carvedilol (COREG) 25 MG tablet Take 1 tablet (25 mg total) by mouth 2 (two) times daily. 180 tablet 3   insulin glargine, 2 Unit Dial, (TOUJEO MAX SOLOSTAR) 300 UNIT/ML Solostar Pen Inject 67-80 Units into the skin daily.     Insulin Pen Needle 32G X 4 MM MISC USE AS DIRECTED 100 each 0   losartan (COZAAR) 100 MG tablet Take 100 mg by mouth daily.     metoprolol tartrate (LOPRESSOR) 25 MG tablet Take 25 mg by mouth 2 (two) times daily.     nitroGLYCERIN (NITROSTAT) 0.4 MG SL tablet Place 0.4 mg under the tongue every 5 (five) minutes as needed for chest pain.     ticagrelor (BRILINTA) 90 MG TABS tablet Take 1 tablet (90 mg total) by mouth  2 (two) times daily. NEEDS PASS 180 tablet 2   Dulaglutide 0.75 MG/0.5ML SOPN INJECT 0.75 MG INTO THE SKIN ONCE A WEEK. 2 mL 6   metFORMIN (GLUCOPHAGE) 500 MG tablet Take 500 mg by mouth 2 (two) times daily with a meal.     No facility-administered medications prior to visit.    Allergies  Allergen Reactions   Jardiance [Empagliflozin] Other (See Comments)    DKA        Objective:    BP 133/69   Pulse 66   Ht 6' 1"  (1.854 m)   Wt 251 lb 2 oz (113.9 kg)   SpO2 98%   BMI 33.13 kg/m  Wt Readings from Last 3 Encounters:  08/04/21 251 lb 2 oz (113.9 kg)  03/06/21 251 lb 9.6 oz (114.1 kg)  11/14/20 234 lb 6.4 oz (106.3 kg)    Physical Exam Vitals and nursing note reviewed.  Constitutional:      Appearance: He is well-developed.  HENT:     Head: Normocephalic and atraumatic.  Cardiovascular:     Rate and Rhythm: Normal rate and regular rhythm.     Heart sounds: Normal heart sounds. No murmur heard.   No friction rub. No gallop.  Pulmonary:     Effort: Pulmonary effort is normal. No tachypnea or respiratory distress.     Breath sounds: Normal breath sounds. No decreased breath sounds, wheezing, rhonchi or rales.  Chest:     Chest wall: No tenderness.  Abdominal:     General: Bowel sounds are normal.     Palpations: Abdomen is soft.  Musculoskeletal:        General: Normal range of motion.     Cervical back: Normal range of motion.  Skin:    General: Skin is warm and dry.  Neurological:     Mental Status: He is alert and oriented to person, place, and time.     Coordination: Coordination normal.  Psychiatric:        Behavior: Behavior normal. Behavior is cooperative.  Thought Content: Thought content normal.        Judgment: Judgment normal.         Patient has been counseled extensively about nutrition and exercise as well as the importance of adherence with medications and regular follow-up. The patient was given clear instructions to go to ER or return  to medical center if symptoms don't improve, worsen or new problems develop. The patient verbalized understanding.   Follow-up: Return in about 3 months (around 11/03/2021).   Gildardo Pounds, FNP-BC [facility] [place], [place]   08/16/2021, 10:04 PM

## 2021-08-05 LAB — CBC WITH DIFFERENTIAL/PLATELET
Basophils Absolute: 0 10*3/uL (ref 0.0–0.2)
Basos: 1 %
EOS (ABSOLUTE): 0.3 10*3/uL (ref 0.0–0.4)
Eos: 4 %
Hematocrit: 36.8 % — ABNORMAL LOW (ref 37.5–51.0)
Hemoglobin: 12.5 g/dL — ABNORMAL LOW (ref 13.0–17.7)
Immature Grans (Abs): 0 10*3/uL (ref 0.0–0.1)
Immature Granulocytes: 0 %
Lymphocytes Absolute: 2 10*3/uL (ref 0.7–3.1)
Lymphs: 28 %
MCH: 32.4 pg (ref 26.6–33.0)
MCHC: 34 g/dL (ref 31.5–35.7)
MCV: 95 fL (ref 79–97)
Monocytes Absolute: 0.6 10*3/uL (ref 0.1–0.9)
Monocytes: 9 %
Neutrophils Absolute: 4.2 10*3/uL (ref 1.4–7.0)
Neutrophils: 58 %
Platelets: 248 10*3/uL (ref 150–450)
RBC: 3.86 x10E6/uL — ABNORMAL LOW (ref 4.14–5.80)
RDW: 12.8 % (ref 11.6–15.4)
WBC: 7.1 10*3/uL (ref 3.4–10.8)

## 2021-08-05 LAB — CMP14+EGFR
ALT: 22 IU/L (ref 0–44)
AST: 17 IU/L (ref 0–40)
Albumin/Globulin Ratio: 1.5 (ref 1.2–2.2)
Albumin: 4.2 g/dL (ref 3.8–4.9)
Alkaline Phosphatase: 60 IU/L (ref 44–121)
BUN/Creatinine Ratio: 16 (ref 9–20)
BUN: 22 mg/dL (ref 6–24)
Bilirubin Total: 0.6 mg/dL (ref 0.0–1.2)
CO2: 19 mmol/L — ABNORMAL LOW (ref 20–29)
Calcium: 10 mg/dL (ref 8.7–10.2)
Chloride: 98 mmol/L (ref 96–106)
Creatinine, Ser: 1.37 mg/dL — ABNORMAL HIGH (ref 0.76–1.27)
Globulin, Total: 2.8 g/dL (ref 1.5–4.5)
Glucose: 260 mg/dL — ABNORMAL HIGH (ref 65–99)
Potassium: 4.7 mmol/L (ref 3.5–5.2)
Sodium: 132 mmol/L — ABNORMAL LOW (ref 134–144)
Total Protein: 7 g/dL (ref 6.0–8.5)
eGFR: 59 mL/min/{1.73_m2} — ABNORMAL LOW (ref >59)

## 2021-08-05 LAB — PSA: Prostate Specific Ag, Serum: 0.5 ng/mL (ref 0.0–4.0)

## 2021-08-05 LAB — LIPASE: Lipase: 34 U/L (ref 13–78)

## 2021-08-12 ENCOUNTER — Other Ambulatory Visit: Payer: Self-pay

## 2021-08-12 ENCOUNTER — Telehealth: Payer: Self-pay | Admitting: Nurse Practitioner

## 2021-08-12 ENCOUNTER — Other Ambulatory Visit: Payer: Self-pay | Admitting: Nurse Practitioner

## 2021-08-12 DIAGNOSIS — D649 Anemia, unspecified: Secondary | ICD-10-CM

## 2021-08-12 DIAGNOSIS — Z1211 Encounter for screening for malignant neoplasm of colon: Secondary | ICD-10-CM

## 2021-08-12 NOTE — Telephone Encounter (Signed)
Pt states Zelda wants him to do a stool sample. He is not sure which kind, card? Pt states his girlfriend works in [place] and she will pick up at 1:30 pm today for him at the front window if you can have ready for her.  Her name is Pamala Hurry.

## 2021-08-15 LAB — FECAL OCCULT BLOOD, IMMUNOCHEMICAL: Fecal Occult Bld: NEGATIVE

## 2021-08-16 ENCOUNTER — Encounter: Payer: Self-pay | Admitting: Nurse Practitioner

## 2021-08-21 ENCOUNTER — Other Ambulatory Visit: Payer: Self-pay | Admitting: Nurse Practitioner

## 2021-08-21 ENCOUNTER — Telehealth: Payer: Self-pay | Admitting: Nurse Practitioner

## 2021-08-21 DIAGNOSIS — R7989 Other specified abnormal findings of blood chemistry: Secondary | ICD-10-CM

## 2021-08-21 MED ORDER — OMEPRAZOLE 40 MG PO CPDR
40.0000 mg | DELAYED_RELEASE_CAPSULE | Freq: Every day | ORAL | 3 refills | Status: DC
  Filled 2021-08-21: qty 30, 30d supply, fill #0
  Filled 2021-09-30: qty 30, 30d supply, fill #1
  Filled 2021-10-28: qty 30, 30d supply, fill #2
  Filled 2021-12-01: qty 30, 30d supply, fill #3

## 2021-08-21 NOTE — Telephone Encounter (Signed)
Pt states he saw Zelda a few weeks ago and was told to repeat blood work in a couple of week. But there are no orders. Can you put the orders in?  Also pt states his issue with his Humberto Seals is not better.  Would like to know what Zelda have him do next?

## 2021-08-21 NOTE — Telephone Encounter (Signed)
Called pt made aware of NP message

## 2021-08-21 NOTE — Telephone Encounter (Signed)
Labs ordered. Omeprazole sent for stomach pain

## 2021-08-24 ENCOUNTER — Other Ambulatory Visit: Payer: Self-pay

## 2021-09-01 ENCOUNTER — Other Ambulatory Visit: Payer: Self-pay

## 2021-09-02 ENCOUNTER — Other Ambulatory Visit: Payer: Self-pay

## 2021-09-14 ENCOUNTER — Other Ambulatory Visit: Payer: Self-pay

## 2021-09-15 ENCOUNTER — Other Ambulatory Visit: Payer: Self-pay

## 2021-09-30 ENCOUNTER — Other Ambulatory Visit: Payer: Self-pay

## 2021-10-01 ENCOUNTER — Other Ambulatory Visit: Payer: Self-pay

## 2021-10-05 ENCOUNTER — Ambulatory Visit: Payer: Self-pay | Admitting: *Deleted

## 2021-10-05 NOTE — Telephone Encounter (Signed)
Reason for Disposition  Abdominal pain is a chronic symptom (recurrent or ongoing AND present > 4 weeks)  Answer Assessment - Initial Assessment Questions 1. LOCATION: "Where does it hurt?"      Upper abdomen above "belly button" 2. RADIATION: "Does the pain shoot anywhere else?" (e.g., chest, back)     Abdomen feels "bloated" 3. ONSET: "When did the pain begin?" (Minutes, hours or days ago)      Approx. 3 weeks  4. SUDDEN: "Gradual or sudden onset?"     Sudden  5. PATTERN "Does the pain come and go, or is it constant?"    - If constant: "Is it getting better, staying the same, or worsening?"      (Note: Constant means the pain never goes away completely; most serious pain is constant and it progresses)     - If intermittent: "How long does it last?" "Do you have pain now?"     (Note: Intermittent means the pain goes away completely between bouts)     Comes and goes  6. SEVERITY: "How bad is the pain?"  (e.g., Scale 1-10; mild, moderate, or severe)    - MILD (1-3): doesn't interfere with normal activities, abdomen soft and not tender to touch     - MODERATE (4-7): interferes with normal activities or awakens from sleep, abdomen tender to touch     - SEVERE (8-10): excruciating pain, doubled over, unable to do any normal activities       Mild  7. RECURRENT SYMPTOM: "Have you ever had this type of stomach pain before?" If Yes, ask: "When was the last time?" and "What happened that time?"      Yes  8. CAUSE: "What do you think is causing the stomach pain?"     Not sure  9. RELIEVING/AGGRAVATING FACTORS: "What makes it better or worse?" (e.g., movement, antacids, bowel movement)     Nothing . Omperzole has helped but does not go away  10. OTHER SYMPTOMS: "Do you have any other symptoms?" (e.g., back pain, diarrhea, fever, urination pain, vomiting)       Dull pain pushing across upper abd.  Protocols used: Abdominal Pain - Male-A-AH

## 2021-10-05 NOTE — Telephone Encounter (Signed)
C/o upper abdominal pain/ discomfort  above "belly button" x 3 weeks. C/o tightness across upper abdomen. Dull pain when pushing across upper abdomen. "Bloated' feeling and tightness all of the time. Denies chest pain, difficulty breathing, no fever, no vomiting. Patient reports he did not come in yet from September for recheck of lab work. No available appt until January for lab. Earliest appt scheduled for 11/06/21. Patient reports he feels he needs to be seen sooner due to Hx cancer. Please advise. Care advise given. Patient verbalized understanding of care advise and to call back or go to [facility] or ED if symptoms worsen. Patient would like a call back ,

## 2021-10-05 NOTE — Telephone Encounter (Signed)
Called pt left VM to call for earlier appt with different provider .

## 2021-10-07 ENCOUNTER — Other Ambulatory Visit: Payer: Self-pay

## 2021-10-07 ENCOUNTER — Ambulatory Visit: Payer: Self-pay | Attending: Nurse Practitioner | Admitting: Physician Assistant

## 2021-10-07 ENCOUNTER — Encounter: Payer: Self-pay | Admitting: Physician Assistant

## 2021-10-07 VITALS — BP 138/76 | HR 71 | Temp 98.8°F | Resp 18 | Ht 73.0 in | Wt 261.0 lb

## 2021-10-07 DIAGNOSIS — Z794 Long term (current) use of insulin: Secondary | ICD-10-CM

## 2021-10-07 DIAGNOSIS — D649 Anemia, unspecified: Secondary | ICD-10-CM

## 2021-10-07 DIAGNOSIS — F1010 Alcohol abuse, uncomplicated: Secondary | ICD-10-CM

## 2021-10-07 DIAGNOSIS — E6609 Other obesity due to excess calories: Secondary | ICD-10-CM

## 2021-10-07 DIAGNOSIS — E1100 Type 2 diabetes mellitus with hyperosmolarity without nonketotic hyperglycemic-hyperosmolar coma (NKHHC): Secondary | ICD-10-CM

## 2021-10-07 DIAGNOSIS — K760 Fatty (change of) liver, not elsewhere classified: Secondary | ICD-10-CM

## 2021-10-07 DIAGNOSIS — I10 Essential (primary) hypertension: Secondary | ICD-10-CM

## 2021-10-07 DIAGNOSIS — R140 Abdominal distension (gaseous): Secondary | ICD-10-CM

## 2021-10-07 DIAGNOSIS — R1084 Generalized abdominal pain: Secondary | ICD-10-CM

## 2021-10-07 DIAGNOSIS — Z6834 Body mass index (BMI) 34.0-34.9, adult: Secondary | ICD-10-CM

## 2021-10-07 NOTE — Patient Instructions (Signed)
I encourage you to continue taking the Prilosec on a daily basis.  I encourage you to work on alcohol cessation.  Please reach out to [place] if you need help with this.  We will call you with today's lab results and with the ultrasound results when they are available.  Kennieth Rad, PA-C [facility] [email or web address]  Alcohol Withdrawal Syndrome Alcohol withdrawal syndrome is a group of symptoms that can develop when a person who drinks heavily and regularly stops drinking or drinks less. Alcohol withdrawal syndrome can be mild or severe, and it may even be life-threatening. Alcohol withdrawal syndrome usually affects people who have alcohol use disorder, which may also be called alcoholism. Alcohol use disorder is when a person is unable to control his or her alcohol use, and drinking too much or too often causes problems at home, at work, or in relationships. What are the causes? Drinking heavily and drinking on a regular basis cause changes in brain chemistry. Over time, the body becomes dependent on alcohol. When alcohol use stops, the chemistry system in the brain becomes unbalanced and causes the symptoms of alcohol withdrawal. What increases the risk? Alcohol withdrawal syndrome is more likely to occur in people who drink more than the recommended limit of alcohol (2 drinks a day for men or 1 drink a day for non-pregnant women). It is also more likely to affect heavy drinkers who have been using alcohol for long periods of time. The more a person drinks and the longer he or she drinks, the greater the risk of alcohol withdrawal syndrome. Severe withdrawal is more likely to develop in someone who: Had severe alcohol withdrawal in the past. Had a seizure during a previous episode of alcohol withdrawal. Is elderly. Uses other drugs. Has a long-term (chronic) medical problem, such as heart, lung, or liver  disease. Has depression. Does not get enough nutrients from his or her diet (malnutrition). What are the signs or symptoms? Symptoms of this condition can be mild to moderate, or they can be severe. Symptoms may develop a few hours (or up to a day) after a person changes his or her drinking patterns. During the 48 hours after he or she has stopped drinking, the following symptoms may go away or get better: Uncontrollable shaking (tremor). Sweating. Headache. Anxiety. Inability to relax (agitation). Trouble sleeping (insomnia). Irregular heartbeats (palpitations). Alcohol cravings. Seizure. The following symptoms may get worse 24-48 hours after a person has decreased or stopped alcohol use, and they may gradually improve over a period of days or weeks: Nausea and vomiting. Fatigue. Sensitivity to light and sounds. Confusion and inability to think clearly. Loss of appetite. Mood swings, irritability, depression, and anxiety. Insomnia and nightmares. The following symptoms are severe and life-threatening. When these symptoms occur together, they are called delirium tremens (DTs): High blood pressure. Increased heart rate. Trouble breathing. Seizures. These may go away along with other symptoms, or they may persist. Seeing, hearing, feeling, smelling, or tasting things that are not there (hallucinations). If you experience hallucinations, they usually begin 12-24 hours after a change in drinking patterns. Delirium tremens requires immediate hospitalization. How is this diagnosed? This condition may be diagnosed based on: Your symptoms and medical history. Your history of alcohol use. Your health care provider may ask questions about your drinking behavior. It is important to be honest when you answer these questions. A psychological assessment. A physical exam. Blood tests or urine tests to measure blood alcohol level and to  rule out other causes of symptoms. MRI or CT scan. This may  be done if you seem to have abnormal thinking or behaviors (altered mental status). Diagnosis can be difficult. People going through withdrawal often avoid seeking medical care and are not thinking clearly. Friends and family members play an important role in recognizing symptoms and encouraging loved ones to get treatment. How is this treated? Most people with symptoms of withdrawal can be treated outside of a hospital setting (outpatient treatment), with close monitoring such as daily check-ins with a health care provider and counseling. You may need treatment at a hospital or treatment center (inpatient treatment) if: You have a history of delirium tremens or seizures. You have severe symptoms. You are addicted to other drugs. You cannot swallow medicine. You have a serious medical condition such as heart failure. You experienced withdrawal in the past but then you continued drinking alcohol. You are not likely to commit to an outpatient treatment schedule. Treatment may involve: Monitoring your blood pressure, pulse, and breathing. IV fluids to keep you hydrated. Medicines to reduce withdrawal symptoms and discomfort (benzodiazepines). Medicine to reduce anxiety. Medicine to prevent or control seizures. Multivitamins and B vitamins. Having a health care provider check on you daily. It is important to get treatment for alcohol withdrawal early. Getting treatment early can: Speed up your recovery from withdrawal symptoms. Make you more successful with long-term stoppage of alcohol use (sobriety). If you need help to stop drinking, your health care provider may recommend a long-term treatment plan that includes: Medicines to help treat alcohol use disorder. Substance abuse counseling. Support groups. Follow these instructions at home:  Take over-the-counter and prescription medicines (including vitamin supplements) only as told by your health care provider. Do not drink alcohol. Do not  drive until your health care provider approves. Have someone you trust stay with you or be available if you need help with your symptoms or with not drinking. Drink enough fluid to keep your urine pale yellow. Consider joining an alcohol support group or treatment program. These can provide emotional support, advice, and guidance. Keep all follow-up visits as told by your health care provider. This is important. Contact a health care provider if: Your symptoms get worse instead of better. You cannot eat or drink without vomiting. You are struggling with not drinking alcohol. You cannot stop drinking alcohol. Get help right away if: You have an irregular heartbeat. You have chest pain. You have trouble breathing. You have a seizure for the first time. You hallucinate. You become very confused. Summary Alcohol withdrawal is a group of symptoms that can develop when a person who drinks heavily and regularly stops drinking or drinks less. Symptoms of this condition can be mild to moderate, or they can be severe. Treatment may include hospitalization, medicine, and counseling. This information is not intended to replace advice given to you by your health care provider. Make sure you discuss any questions you have with your health care provider. Document Revised: 10/06/2020 Document Reviewed: 10/06/2020 Elsevier Patient Education  2022 [employer].    Alcohol Use Disorder Alcohol use disorder is a condition in which drinking disrupts daily life. People with this condition drink too much alcohol and cannot control their drinking. Alcohol use disorder can cause serious problems with physical health. It can affect the brain, heart, and other internal organs. This disorder can raise the risk for certain cancers and cause problems with mental health, such as depression or anxiety. What are the causes? This  condition is caused by drinking too much alcohol over time. Some people with this  condition drink to cope with or escape from negative life events. Others drink to relieve pain or symptoms of mental illness. What increases the risk? You are more likely to develop this condition if: You have a family history of alcohol use disorder. Your culture encourages drinking to the point of becoming drunk (intoxication). You had a mood or conduct disorder in childhood. You have been abused. You are an adolescent and you: Have poor performance in school. Have poor supervision or guidance. Act on impulse and like taking risks. What are the signs or symptoms? Symptoms of this condition include: Drinking more than you want to. Trying several times without success to drink less. Spending a lot of time thinking about alcohol, getting alcohol, drinking, or recovering from drinking. Continuing to drink even when it is causing serious problems in your daily life. Drinking when it is dangerous to drink, such as before driving a car. Needing more and more alcohol to get the same effect you want (building up tolerance). Having symptoms of withdrawal when you stop drinking. Withdrawal symptoms may include: Trouble sleeping, leading to tiredness (fatigue). Mood swings of depression and anxiety. Physical symptoms, such as a fast heart rate, rapid breathing, high blood pressure (hypertension), fever, cold sweats, or nausea. Seizures. Severe confusion. Feeling or seeing things that are not there (hallucinations). Shaking movements that you cannot control (tremors). How is this diagnosed? This condition is diagnosed with an assessment. Your health care provider may start by asking three or four questions about your drinking, or he or she may give you a simple test to take. This helps to get clear information from you. You may also have a physical exam or lab tests. You may be referred to a substance abuse counselor. How is this treated? With education, some people with alcohol use disorder are  able to reduce their drinking. Many with this disorder cannot change their drinking behavior on their own and need help from substance use specialists. These specialists are counselors who can help diagnose how severe your disorder is and what type of treatment you need. Treatments may include: Detoxification. Detoxification involves quitting drinking with supervision and direction of health care providers. Your health care provider may prescribe prescription medicines within the first week to help lessen withdrawal symptoms. Alcohol withdrawal can be dangerous and life-threatening. Detoxification may be provided in a home, community, or primary care setting, or in a hospital or substance use treatment facility. Counseling. This may involve motivational interviewing (MI), family therapy, or cognitive behavioral therapy (CBT). It is provided by substance use treatment counselors or professional therapists. A counselor can address the things you can do to change your drinking behavior and how to maintain the changes. Talk therapy aims to: Identify your positive motivations to change. Identify and avoid the things that trigger your drinking. Help you learn how to plan your behavior change. Develop support systems that can help you sustain the change. Medicines. Medicines can help treat this disorder by: Decreasing cravings. Decreasing the positive feeling you have when you drink. Causing an uncomfortable physical reaction when you drink (aversion therapy). Mutual help groups such as Alcoholics Anonymous (AA). These groups are led by people who have quit drinking. The groups provide emotional support, advice, and guidance. Some people with this condition benefit from a combination of treatments provided by specialized substance use treatment centers. Follow these instructions at home: Medicines Take over-the-counter and prescription medicines  only as told by your health care provider. Ask before starting  any new medicines, herbs, or supplements. General instructions Ask friends and family members to support your choice to stay sober. Avoid situations where alcohol is served. Create a plan to deal with tempting situations. Attend support groups regularly. Practice hobbies or activities you enjoy. Do not drink and drive. Keep all follow-up visits as told by your health care provider. This is important. How is this prevented? If you drink alcohol: Limit how much you use to: 0-1 drink a day for nonpregnant women. 0-2 drinks a day for men. Be aware of how much alcohol is in your drink. In the [place] one drink equals one 12 oz bottle of beer (355 mL), one 5 oz glass of wine (148 mL), or one 1 oz glass of hard liquor (44 mL). If you have a mental health condition, seek treatment. Develop a healthy lifestyle through: Meditation or deep breathing. Exercise. Spending time in nature. Listening to music. Talking with a trusted friend or family member. If you are an adolescent: Do not drink alcohol. Avoid gatherings where you might be tempted. Do not be afraid to say no if someone offers you alcohol. Speak up about why you do not want to drink. Set a positive example for others around you by not drinking. Build relationships with friends who do not drink. Where to find more information Substance Abuse and Mental Health Services Administration: [employer].cy Alcoholics Anonymous: [employer]ch Contact a health care provider if: You cannot take your medicines as told. Your symptoms get worse or you experience symptoms of withdrawal when you stop drinking. You start drinking again (relapse) and your symptoms get worse. Get help right away if: You have thoughts about hurting yourself or others. If you ever feel like you may hurt yourself or others, or have thoughts about taking your own life, get help right away. Go to your nearest emergency department or: Call your local emergency services (911 in the  [place] Call a suicide crisis helpline, such as the [place] at 2517392500 or 988 in the [place]. This is open 24 hours a day in the [place] Text the Crisis Text Line at 403-550-7531 (in the [place].). Summary Alcohol use disorder is a condition in which drinking disrupts daily life. People with this condition drink too much alcohol and cannot control their drinking. Treatment may include detoxification, counseling, medicines, and support groups. Ask friends and family members to support you. Avoid situations where alcohol is served. Get help right away if you have thoughts about hurting yourself or others. This information is not intended to replace advice given to you by your health care provider. Make sure you discuss any questions you have with your health care provider. Document Revised: 06/10/2021 Document Reviewed: 10/04/2019 Elsevier Patient Education  2022 [employer].

## 2021-10-07 NOTE — Progress Notes (Signed)
Patient took medication around 11:00 am today and patient had breakfast cookies this morning. Patient complains of dull abdominal pain intermittently daily.

## 2021-10-07 NOTE — Progress Notes (Signed)
Established Patient Office Visit  Subjective:  Patient ID: Nathan Calderon, male    DOB: May 24, 1961  Age: 60 y.o. MRN: [id number]  CC:  Chief Complaint  Patient presents with   Follow-up    52mh DM    HPI Nathan GUERCIOreports that he has been having a dull pain in his abdomen for the last 3 to 4 months, states that he feels bloated, abdomen feels tight.  Reports that he does suffer from heartburn and acid reflux, states that he will use omeprazole 40 mg with moderate relief.  States that he has been taking the omeprazole for the past month.  Reports that he was having episodes of diarrhea when his abdominal pain began, states that once he started the omeprazole the diarrhea episodes did improve, but does state that he did have 1 episode of diarrhea yesterday after eating fast food.  Endorses significant history of heavy alcohol use, states that he drinks 4-5 beers 4-5 times a week.  Denies NSAID use.  Does endorse shortness of breath when bending over, states that he sleeps with 2 pillows.  Does endorse occasional swelling in his ankles, states that current swelling has been present for the past couple of weeks.    Past Medical History:  Diagnosis Date   Angina    2006, had cardiac cath at the time   Coronary artery disease    Diabetes mellitus    Type 2   Headache(784.0)    Heart murmur    as child   Hyperlipidemia    Hypertension    Kidney stone    hx of   Medical history non-contributory    Motion sickness    ocean boats   Sleep apnea    no CPAP.  Never got it.   [place]    Past Surgical History:  Procedure Laterality Date   CARDIAC CATHETERIZATION  2000   and 2006   COLONOSCOPY WITH PROPOFOL N/A 05/12/2017   Procedure: COLONOSCOPY WITH PROPOFOL;  Surgeon: WLucilla Lame MD;  [facility]  Service: Endoscopy;  Laterality: N/A;  Cannot arrive before 8:45 Diabetic - insulin and oral meds sleep apnea   CORONARY STENT  INTERVENTION N/A 11/04/2020   Procedure: CORONARY STENT INTERVENTION;  Surgeon: VJettie Booze MD;  [facility];  Service: Cardiovascular;  Laterality: N/A;   EYE SURGERY     x 2   HERNIA REPAIR     with undecended testicle   INTRAVASCULAR ULTRASOUND/IVUS N/A 11/04/2020   Procedure: Intravascular Ultrasound/IVUS;  Surgeon: VJettie Booze MD;  [facility];  Service: Cardiovascular;  Laterality: N/A;   LASIK     ORCHIECTOMY Right 04/08/2016   Procedure: ORCHIECTOMY/ INGUINAL APPROACH;  Surgeon: AHollice Espy MD;  Location: [facility];  Service: Urology;  Laterality: Right;   PARS PLANA VITRECTOMY  04/04/2012   Procedure: PARS PLANA VITRECTOMY WITH 25 GAUGE;  Surgeon: JHayden Pedro MD;  [place] [facility]  Service: Ophthalmology;  Laterality: Left;  with laser   POLYPECTOMY  05/12/2017   Procedure: POLYPECTOMY;  Surgeon: WLucilla Lame MD;  [facility] [facility]  Service: Endoscopy;;   RIGHT/LEFT HEART CATH AND CORONARY ANGIOGRAPHY N/A 11/04/2020   Procedure: RIGHT/LEFT HEART CATH AND CORONARY ANGIOGRAPHY;  Surgeon: VJettie Booze MD;  [facility];  Service: Cardiovascular;  Laterality: N/A;   SHOULDER SURGERY     left shoulder   testicle remove  VASECTOMY Left 04/08/2016   Procedure: VASECTOMY;  Surgeon: [name], MD;  Location: [facility];  Service: Urology;  Laterality: Left;    Family History  Problem Relation Age of Onset   Diabetes Mother    Heart disease Mother    Hypertension Mother    Heart disease Father    Ovarian cancer Maternal Grandmother    Kidney disease Neg Hx    Prostate cancer Neg Hx     Social History   Socioeconomic History   Marital status: Divorced    Spouse name: Not on file   Number of children: Not on file   Years of education: Not on file   Highest education level: Not on file  Occupational History   Not on file  Tobacco Use   Smoking status: Never   Smokeless  tobacco: Never  Vaping Use   Vaping Use: Never used  Substance and Sexual Activity   Alcohol use: Yes    Alcohol/week: 24.0 standard drinks    Types: 24 Cans of beer per week    Comment: 3-4 beers a day ( "not every day") 2- 3 times a week   Drug use: No   Sexual activity: Not Currently    Birth control/protection: None  Other Topics Concern   Not on file  Social History Narrative   Not on file   Social Determinants of Health   Financial Resource Strain: Not on file  Food Insecurity: Not on file  Transportation Needs: Not on file  Physical Activity: Not on file  Stress: Not on file  Social Connections: Not on file  Intimate Partner Violence: Not on file    Outpatient Medications Prior to Visit  Medication Sig Dispense Refill   amLODipine (NORVASC) 10 MG tablet Take 10 mg by mouth daily.  5   aspirin EC 81 MG tablet Take 81 mg by mouth daily. Swallow whole.     atorvastatin (LIPITOR) 80 MG tablet Take 80 mg by mouth daily.     Blood Glucose Monitoring Suppl (TRUE METRIX METER) w/Device KIT Use as instructed. Check blood glucose level by fingerstick three times per day. E11.65 1 kit 0   carvedilol (COREG) 25 MG tablet Take 1 tablet (25 mg total) by mouth 2 (two) times daily. 180 tablet 3   Dulaglutide (TRULICITY) 1.5 QX/4.5WT SOPN Inject 1.5 mg into the skin once a week. NEEDS PASS 6 mL 1   glucose blood (TRUE METRIX BLOOD GLUCOSE TEST) test strip Use as instructed 100 each 12   insulin glargine, 2 Unit Dial, (TOUJEO MAX SOLOSTAR) 300 UNIT/ML Solostar Pen Inject 67-80 Units into the skin daily.     Insulin Pen Needle 32G X 4 MM MISC USE AS DIRECTED 100 each 0   losartan (COZAAR) 100 MG tablet Take 100 mg by mouth daily.     metoprolol tartrate (LOPRESSOR) 25 MG tablet Take 25 mg by mouth 2 (two) times daily.     nitroGLYCERIN (NITROSTAT) 0.4 MG SL tablet Place 0.4 mg under the tongue every 5 (five) minutes as needed for chest pain.     omeprazole (PRILOSEC) 40 MG capsule Take 1  capsule (40 mg total) by mouth daily. 30 capsule 3   ticagrelor (BRILINTA) 90 MG TABS tablet Take 1 tablet (90 mg total) by mouth 2 (two) times daily. NEEDS PASS 180 tablet 2   No facility-administered medications prior to visit.    Allergies  Allergen Reactions   Jardiance [Empagliflozin] Other (See Comments)    DKA  ROS Review of Systems  Constitutional: Negative.   HENT: Negative.    Eyes: Negative.   Respiratory:  Negative for shortness of breath.   Cardiovascular:  Positive for leg swelling. Negative for chest pain.  Gastrointestinal:  Positive for abdominal distention and abdominal pain. Negative for diarrhea, nausea and vomiting.  Endocrine: Negative.   Genitourinary: Negative.   Musculoskeletal: Negative.   Skin: Negative.   Allergic/Immunologic: Negative.   Neurological: Negative.   Hematological: Negative.   Psychiatric/Behavioral: Negative.       Objective:    Physical Exam Vitals and nursing note reviewed.  Constitutional:      Appearance: Normal appearance.  HENT:     Head: Normocephalic and atraumatic.     Right Ear: External ear normal.     Left Ear: External ear normal.     Nose: Nose normal.     Mouth/Throat:     Mouth: Mucous membranes are moist.     Pharynx: Oropharynx is clear.  Eyes:     Extraocular Movements: Extraocular movements intact.     Conjunctiva/sclera: Conjunctivae normal.     Pupils: Pupils are equal, round, and reactive to light.  Cardiovascular:     Rate and Rhythm: Normal rate and regular rhythm.     Pulses: Normal pulses.     Heart sounds: Normal heart sounds.  Pulmonary:     Effort: Pulmonary effort is normal.     Breath sounds: Normal breath sounds.  Abdominal:     General: Bowel sounds are normal. There is distension.     Palpations: Abdomen is rigid.     Tenderness: There is generalized abdominal tenderness.  Musculoskeletal:        General: Normal range of motion.     Cervical back: Normal range of motion and  neck supple.  Skin:    General: Skin is warm and dry.  Neurological:     General: No focal deficit present.     Mental Status: He is alert and oriented to person, place, and time.  Psychiatric:        Mood and Affect: Mood normal.        Behavior: Behavior normal.        Thought Content: Thought content normal.        Judgment: Judgment normal.    BP 138/76 (BP Location: Left Arm, Patient Position: Sitting, Cuff Size: Normal)   Pulse 71   Temp 98.8 F (37.1 C) (Oral)   Resp 18   Ht _0  (1.854 m)   Wt 261 lb (118.4 kg)   SpO2 99%   BMI 34.43 kg/m  Wt Readings from Last 3 Encounters:  10/07/21 261 lb (118.4 kg)  08/04/21 251 lb 2 oz (113.9 kg)  03/06/21 251 lb 9.6 oz (114.1 kg)     Health Maintenance Due  Topic Date Due   OPHTHALMOLOGY EXAM  Never done   COVID-19 Vaccine (3 - Pfizer risk series) 04/21/2020    There are no preventive care reminders to display for this patient.  Lab Results  Component Value Date   TSH 2.737 05/19/2016   Lab Results  Component Value Date   WBC 6.4 10/07/2021   HGB 12.8 (L) 10/07/2021   HCT 37.6 10/07/2021   MCV 93 10/07/2021   PLT 241 10/07/2021   Lab Results  Component Value Date   NA 134 10/07/2021   K 4.0 10/07/2021   CO2 19 (L) 08/04/2021   GLUCOSE 230 (H) 10/07/2021   BUN 16 10/07/2021  CREATININE 1.04 10/07/2021   BILITOT 0.4 10/07/2021   ALKPHOS 59 10/07/2021   AST 17 10/07/2021   ALT 22 08/04/2021   PROT 7.0 10/07/2021   ALBUMIN 4.2 10/07/2021   CALCIUM 9.3 10/07/2021   ANIONGAP 13 11/05/2020   EGFR 82 10/07/2021   Lab Results  Component Value Date   CHOL 120 01/06/2021   Lab Results  Component Value Date   HDL 63 01/06/2021   Lab Results  Component Value Date   LDLCALC 45 01/06/2021   Lab Results  Component Value Date   TRIG 52 01/06/2021   Lab Results  Component Value Date   CHOLHDL 1.9 01/06/2021   Lab Results  Component Value Date   HGBA1C 8.7 (A) 08/04/2021      Assessment &  Plan:   Problem List Items Addressed This Visit       Cardiovascular and Mediastinum   Hypertension     Digestive   Fatty liver   Relevant Orders   US Abdomen Limited RUQ (LIVER/GB)     Endocrine   Type 2 diabetes mellitus with hyperosmolarity without coma, with long-term current use of insulin (HCC)     Other   Anemia   Relevant Orders   Iron, TIBC and Ferritin Panel (Completed)   Abdominal bloating   Relevant Orders   US Abdomen Limited RUQ (LIVER/GB)   Generalized abdominal pain - Primary   Relevant Orders   CBC with Differential/Platelet (Completed)   Comp. Metabolic Panel (12) (Completed)   Alcohol abuse   Relevant Orders   US Abdomen Limited RUQ (LIVER/GB)   Other Visit Diagnoses     Class 1 obesity due to excess calories with serious comorbidity and body mass index (BMI) of 34.0 to 34.9 in adult           No orders of the defined types were placed in this encounter. 1. Generalized abdominal pain Patient encouraged to continue taking Prilosec on a daily basis.  Patient had CT of abdomen completed November 29, 2018   IMPRESSION: 1. Wall thickening involving the ascending colon and proximal transverse colon, possibly indicating colitis, but the wall thickening may just be related to the fact that these segments are decompressed. Does the patient have diarrhea or other symptoms that might make 1 suspect colitis? 2. Mild diffuse hepatic steatosis without focal hepatic parenchymal abnormality. 3. Non-obstructing LEFT LOWER pole renal calculi, the largest measuring approximately 6 mm. 4. Very small hiatal hernia. Mild thickening of the wall of the distal esophagus may indicate GE reflux disease. 5. Scattered descending and sigmoid colon diverticula without evidence of acute diverticulitis. 6. High-riding LEFT testicle located in the inguinal canal. Small LEFT inguinal hernia containing fat. Small amount of fluid in the LEFT inguinal canal. 7. Prior RIGHT  orchiectomy.   Aortic Atherosclerosis (ICD10-170.0)     Electronically Signed   By: Evangeline Dakin M.D.   On: 11/29/2018 18:17  Patient education given on supportive care.  Red flags for prompt reevaluation  - CBC with Differential/Platelet - Comp. Metabolic Panel (12)  2. Abdominal bloating  - US Abdomen Limited RUQ (LIVER/GB); Future  3. Fatty liver  - US Abdomen Limited RUQ (LIVER/GB); Future  4. Alcohol abuse Patient strongly encouraged to reduce and stop alcohol intake.  Patient declined referral to substance abuse counseling.  Patient education given on alcohol withdrawal symptoms. - US Abdomen Limited RUQ (LIVER/GB); Future  5. Type 2 diabetes mellitus with hyperosmolarity without coma, with long-term current use of insulin ([place])  A1c 8.7.  Patient to follow-up with primary care provider in 3 to 4 weeks.  6. Primary hypertension Continue current regimen  7. Anemia, unspecified type  - Iron, TIBC and Ferritin Panel  8. Class 1 obesity due to excess calories with serious comorbidity and body mass index (BMI) of 34.0 to 34.9 in adult    I have reviewed the patient's medical history (PMH, PSH, Social History, Family History, Medications, and allergies) , and have been updated if relevant. I spent 30 minutes reviewing chart and  face to face time with patient.     Follow-up: Return in about 4 weeks (around 11/04/2021) for [facility].    Loraine Grip Mayers, PA-C

## 2021-10-08 ENCOUNTER — Telehealth: Payer: Self-pay | Admitting: *Deleted

## 2021-10-08 ENCOUNTER — Encounter: Payer: Self-pay | Admitting: Physician Assistant

## 2021-10-08 DIAGNOSIS — F1010 Alcohol abuse, uncomplicated: Secondary | ICD-10-CM | POA: Insufficient documentation

## 2021-10-08 DIAGNOSIS — K760 Fatty (change of) liver, not elsewhere classified: Secondary | ICD-10-CM | POA: Insufficient documentation

## 2021-10-08 DIAGNOSIS — R140 Abdominal distension (gaseous): Secondary | ICD-10-CM | POA: Insufficient documentation

## 2021-10-08 DIAGNOSIS — R1084 Generalized abdominal pain: Secondary | ICD-10-CM | POA: Insufficient documentation

## 2021-10-08 LAB — CBC WITH DIFFERENTIAL/PLATELET
Basophils Absolute: 0 10*3/uL (ref 0.0–0.2)
Basos: 1 %
EOS (ABSOLUTE): 0.2 10*3/uL (ref 0.0–0.4)
Eos: 4 %
Hematocrit: 37.6 % (ref 37.5–51.0)
Hemoglobin: 12.8 g/dL — ABNORMAL LOW (ref 13.0–17.7)
Immature Grans (Abs): 0 10*3/uL (ref 0.0–0.1)
Immature Granulocytes: 0 %
Lymphocytes Absolute: 1.8 10*3/uL (ref 0.7–3.1)
Lymphs: 28 %
MCH: 31.7 pg (ref 26.6–33.0)
MCHC: 34 g/dL (ref 31.5–35.7)
MCV: 93 fL (ref 79–97)
Monocytes Absolute: 0.6 10*3/uL (ref 0.1–0.9)
Monocytes: 9 %
Neutrophils Absolute: 3.7 10*3/uL (ref 1.4–7.0)
Neutrophils: 58 %
Platelets: 241 10*3/uL (ref 150–450)
RBC: 4.04 x10E6/uL — ABNORMAL LOW (ref 4.14–5.80)
RDW: 12.1 % (ref 11.6–15.4)
WBC: 6.4 10*3/uL (ref 3.4–10.8)

## 2021-10-08 LAB — COMP. METABOLIC PANEL (12)
AST: 17 IU/L (ref 0–40)
Albumin/Globulin Ratio: 1.5 (ref 1.2–2.2)
Albumin: 4.2 g/dL (ref 3.8–4.9)
Alkaline Phosphatase: 59 IU/L (ref 44–121)
BUN/Creatinine Ratio: 15 (ref 10–24)
BUN: 16 mg/dL (ref 8–27)
Bilirubin Total: 0.4 mg/dL (ref 0.0–1.2)
Calcium: 9.3 mg/dL (ref 8.6–10.2)
Chloride: 97 mmol/L (ref 96–106)
Creatinine, Ser: 1.04 mg/dL (ref 0.76–1.27)
Globulin, Total: 2.8 g/dL (ref 1.5–4.5)
Glucose: 230 mg/dL — ABNORMAL HIGH (ref 70–99)
Potassium: 4 mmol/L (ref 3.5–5.2)
Sodium: 134 mmol/L (ref 134–144)
Total Protein: 7 g/dL (ref 6.0–8.5)
eGFR: 82 mL/min/{1.73_m2} (ref >59)

## 2021-10-08 LAB — IRON,TIBC AND FERRITIN PANEL
Ferritin: 221 ng/mL (ref 30–400)
Iron Saturation: 21 % (ref 15–55)
Iron: 74 ug/dL (ref 38–169)
Total Iron Binding Capacity: 359 ug/dL (ref 250–450)
UIBC: 285 ug/dL (ref 111–343)

## 2021-10-08 NOTE — Telephone Encounter (Signed)
-----   Message from Kennieth Rad, [place] sent at 10/08/2021  1:54 PM EST ----- Please call patient and let him know that his kidney function and liver enzymes are within normal limits.  He does not show signs of anemia.  His iron is also within normal limits.  Please continue with ultrasound of liver.

## 2021-10-08 NOTE — Telephone Encounter (Signed)
Patient verified DOB Patient is aware of labs being normal.

## 2021-10-16 ENCOUNTER — Other Ambulatory Visit: Payer: Self-pay

## 2021-10-16 ENCOUNTER — Ambulatory Visit (HOSPITAL_COMMUNITY)
Admission: RE | Admit: 2021-10-16 | Discharge: 2021-10-16 | Disposition: A | Discharge status: Home / Self Care | Payer: Self-pay | Source: Ambulatory Visit | Attending: Physician Assistant | Admitting: Physician Assistant

## 2021-10-16 DIAGNOSIS — R140 Abdominal distension (gaseous): Secondary | ICD-10-CM | POA: Insufficient documentation

## 2021-10-16 DIAGNOSIS — K760 Fatty (change of) liver, not elsewhere classified: Secondary | ICD-10-CM | POA: Insufficient documentation

## 2021-10-16 DIAGNOSIS — F1010 Alcohol abuse, uncomplicated: Secondary | ICD-10-CM | POA: Insufficient documentation

## 2021-10-28 ENCOUNTER — Other Ambulatory Visit: Payer: Self-pay

## 2021-11-06 ENCOUNTER — Ambulatory Visit: Payer: Self-pay | Admitting: Nurse Practitioner

## 2021-11-18 ENCOUNTER — Other Ambulatory Visit: Payer: Self-pay

## 2021-11-19 ENCOUNTER — Other Ambulatory Visit: Payer: Self-pay

## 2021-12-01 ENCOUNTER — Other Ambulatory Visit: Payer: Self-pay

## 2021-12-09 ENCOUNTER — Other Ambulatory Visit: Payer: Self-pay | Admitting: Nurse Practitioner

## 2021-12-09 ENCOUNTER — Other Ambulatory Visit: Payer: Self-pay

## 2021-12-09 DIAGNOSIS — I214 Non-ST elevation (NSTEMI) myocardial infarction: Secondary | ICD-10-CM

## 2021-12-09 NOTE — Telephone Encounter (Signed)
Requested medication (s) are due for refill today - no  Requested medication (s) are on the active medication list -yes  Future visit scheduled -no  Last refill: 12/15/20 #180 2RF  Notes to clinic: Patient requesting provider review of continuation of Rx- was told 1 year and that time period is here- does he continue medication? Non delegated Rx  Requested Prescriptions  Pending Prescriptions Disp Refills   ticagrelor (BRILINTA) 90 MG TABS tablet 180 tablet 2    Sig: Take 1 tablet (90 mg total) by mouth 2 (two) times daily. NEEDS PASS     Not Delegated - Hematology: Antiplatelets - ticagrelor Failed - 12/09/2021  9:23 AM      Failed - This refill cannot be delegated      Failed - HGB in normal range and within 180 days    Hemoglobin  Date Value Ref Range Status  10/07/2021 12.8 (L) 13.0 - 17.7 g/dL Final          Passed - Cr in normal range and within 180 days    Creatinine, Ser  Date Value Ref Range Status  10/07/2021 1.04 0.76 - 1.27 mg/dL Final          Passed - HCT in normal range and within 180 days    Hematocrit  Date Value Ref Range Status  10/07/2021 37.6 37.5 - 51.0 % Final          Passed - PLT in normal range and within 180 days    Platelets  Date Value Ref Range Status  10/07/2021 241 150 - 450 x10E3/uL Final          Passed - Valid encounter within last 6 months    Recent Outpatient Visits           2 months ago Generalized abdominal pain   [place], Cari S, PA-C   4 months ago Type 2 diabetes mellitus with hyperosmolarity without coma, with long-term current use of insulin [facility])   [place], [place] W, NP   11 months ago Hospital discharge follow-up   [place], Vernia Buff, NP                 Requested Prescriptions  Pending Prescriptions Disp Refills   ticagrelor (BRILINTA) 90 MG TABS tablet 180 tablet 2    Sig: Take 1  tablet (90 mg total) by mouth 2 (two) times daily. NEEDS PASS     Not Delegated - Hematology: Antiplatelets - ticagrelor Failed - 12/09/2021  9:23 AM      Failed - This refill cannot be delegated      Failed - HGB in normal range and within 180 days    Hemoglobin  Date Value Ref Range Status  10/07/2021 12.8 (L) 13.0 - 17.7 g/dL Final          Passed - Cr in normal range and within 180 days    Creatinine, Ser  Date Value Ref Range Status  10/07/2021 1.04 0.76 - 1.27 mg/dL Final          Passed - HCT in normal range and within 180 days    Hematocrit  Date Value Ref Range Status  10/07/2021 37.6 37.5 - 51.0 % Final          Passed - PLT in normal range and within 180 days    Platelets  Date Value Ref Range Status  10/07/2021 241 150 -  450 x10E3/uL Final          Passed - Valid encounter within last 6 months    Recent Outpatient Visits           2 months ago Generalized abdominal pain   [place], PA-C   4 months ago Type 2 diabetes mellitus with hyperosmolarity without coma, with long-term current use of insulin [facility])   [place] Gildardo Pounds, NP   11 months ago Hospital discharge follow-up   [place] Gildardo Pounds, NP

## 2021-12-16 ENCOUNTER — Other Ambulatory Visit: Payer: Self-pay

## 2021-12-21 ENCOUNTER — Other Ambulatory Visit: Payer: Self-pay

## 2021-12-21 ENCOUNTER — Other Ambulatory Visit: Payer: Self-pay | Admitting: Nurse Practitioner

## 2021-12-21 MED ORDER — OMEPRAZOLE 40 MG PO CPDR
40.0000 mg | DELAYED_RELEASE_CAPSULE | Freq: Every day | ORAL | 3 refills | Status: AC
  Filled 2021-12-21: qty 30, 30d supply, fill #0

## 2021-12-21 NOTE — Telephone Encounter (Signed)
Requested Prescriptions  Pending Prescriptions Disp Refills   omeprazole (PRILOSEC) 40 MG capsule 30 capsule 3    Sig: Take 1 capsule (40 mg total) by mouth daily.     Gastroenterology: Proton Pump Inhibitors Passed - 12/21/2021 11:54 AM      Passed - Valid encounter within last 12 months    Recent Outpatient Visits          2 months ago Generalized abdominal pain   [place], PA-C   4 months ago Type 2 diabetes mellitus with hyperosmolarity without coma, with long-term current use of insulin [facility])   [place], Vernia Buff, NP   1 year ago Hospital discharge follow-up   [place] Gildardo Pounds, NP

## 2021-12-22 ENCOUNTER — Other Ambulatory Visit: Payer: Self-pay

## 2021-12-23 ENCOUNTER — Other Ambulatory Visit: Payer: Self-pay

## 2022-01-22 ENCOUNTER — Other Ambulatory Visit: Payer: Self-pay

## 2022-01-25 ENCOUNTER — Other Ambulatory Visit: Payer: Self-pay | Admitting: Pharmacist

## 2022-01-25 ENCOUNTER — Other Ambulatory Visit: Payer: Self-pay

## 2022-01-25 DIAGNOSIS — Z794 Long term (current) use of insulin: Secondary | ICD-10-CM

## 2022-01-25 DIAGNOSIS — E1100 Type 2 diabetes mellitus with hyperosmolarity without nonketotic hyperglycemic-hyperosmolar coma (NKHHC): Secondary | ICD-10-CM

## 2022-01-25 MED ORDER — TRULICITY 1.5 MG/0.5ML ~~LOC~~ SOAJ
1.5000 mg | SUBCUTANEOUS | 0 refills | Status: DC
  Filled 2022-01-25: qty 2, 28d supply, fill #0

## 2022-01-25 MED ORDER — TRULICITY 1.5 MG/0.5ML ~~LOC~~ SOAJ: 1.5000 mg | SUBCUTANEOUS | 0 refills | Status: AC

## 2022-01-26 ENCOUNTER — Other Ambulatory Visit: Payer: Self-pay

## 2022-01-28 ENCOUNTER — Other Ambulatory Visit: Payer: Self-pay

## 2022-01-29 ENCOUNTER — Other Ambulatory Visit: Payer: Self-pay

## 2022-05-31 ENCOUNTER — Other Ambulatory Visit: Payer: Self-pay
